# Patient Record
Sex: Male | Born: 1950 | Race: White | Hispanic: No | Marital: Married | Smoking: Never smoker
Health system: Southern US, Community
[De-identification: ages and names within clinical notes are randomized; demographics above are authoritative.]

## PROBLEM LIST (undated history)

## (undated) DIAGNOSIS — Z973 Presence of spectacles and contact lenses: Secondary | ICD-10-CM

## (undated) DIAGNOSIS — I10 Essential (primary) hypertension: Secondary | ICD-10-CM

## (undated) DIAGNOSIS — E782 Mixed hyperlipidemia: Secondary | ICD-10-CM

## (undated) DIAGNOSIS — Z87442 Personal history of urinary calculi: Secondary | ICD-10-CM

## (undated) DIAGNOSIS — I209 Angina pectoris, unspecified: Secondary | ICD-10-CM

## (undated) DIAGNOSIS — S83206A Unspecified tear of unspecified meniscus, current injury, right knee, initial encounter: Secondary | ICD-10-CM

## (undated) DIAGNOSIS — K760 Fatty (change of) liver, not elsewhere classified: Secondary | ICD-10-CM

## (undated) DIAGNOSIS — R011 Cardiac murmur, unspecified: Secondary | ICD-10-CM

## (undated) DIAGNOSIS — E785 Hyperlipidemia, unspecified: Secondary | ICD-10-CM

## (undated) DIAGNOSIS — M1990 Unspecified osteoarthritis, unspecified site: Secondary | ICD-10-CM

## (undated) DIAGNOSIS — R931 Abnormal findings on diagnostic imaging of heart and coronary circulation: Secondary | ICD-10-CM

## (undated) DIAGNOSIS — G4733 Obstructive sleep apnea (adult) (pediatric): Secondary | ICD-10-CM

## (undated) DIAGNOSIS — S83249A Other tear of medial meniscus, current injury, unspecified knee, initial encounter: Secondary | ICD-10-CM

## (undated) DIAGNOSIS — Z131 Encounter for screening for diabetes mellitus: Secondary | ICD-10-CM

## (undated) DIAGNOSIS — I2510 Atherosclerotic heart disease of native coronary artery without angina pectoris: Secondary | ICD-10-CM

## (undated) DIAGNOSIS — J45909 Unspecified asthma, uncomplicated: Secondary | ICD-10-CM

## (undated) DIAGNOSIS — T8859XA Other complications of anesthesia, initial encounter: Secondary | ICD-10-CM

## (undated) DIAGNOSIS — I300 Acute nonspecific idiopathic pericarditis: Secondary | ICD-10-CM

## (undated) DIAGNOSIS — Z860101 Personal history of adenomatous and serrated colon polyps: Secondary | ICD-10-CM

## (undated) DIAGNOSIS — J22 Unspecified acute lower respiratory infection: Secondary | ICD-10-CM

## (undated) DIAGNOSIS — K645 Perianal venous thrombosis: Secondary | ICD-10-CM

## (undated) DIAGNOSIS — Z86010 Personal history of colonic polyps: Secondary | ICD-10-CM

## (undated) DIAGNOSIS — T4145XA Adverse effect of unspecified anesthetic, initial encounter: Secondary | ICD-10-CM

## (undated) DIAGNOSIS — Z9989 Dependence on other enabling machines and devices: Secondary | ICD-10-CM

## (undated) HISTORY — DX: Abnormal findings on diagnostic imaging of heart and coronary circulation: R93.1

## (undated) HISTORY — DX: Unspecified acute lower respiratory infection: J22

## (undated) HISTORY — DX: Acute nonspecific idiopathic pericarditis: I30.0

## (undated) HISTORY — DX: Angina pectoris, unspecified: I20.9

## (undated) HISTORY — DX: Mixed hyperlipidemia: E78.2

## (undated) HISTORY — PX: APPENDECTOMY: SHX54

## (undated) HISTORY — DX: Essential (primary) hypertension: I10

## (undated) HISTORY — DX: Obstructive sleep apnea (adult) (pediatric): G47.33

## (undated) HISTORY — DX: Other tear of medial meniscus, current injury, unspecified knee, initial encounter: S83.249A

## (undated) HISTORY — PX: TOTAL KNEE ARTHROPLASTY: SHX125

## (undated) HISTORY — DX: Perianal venous thrombosis: K64.5

## (undated) HISTORY — DX: Encounter for screening for diabetes mellitus: Z13.1

---

## 1993-08-26 HISTORY — PX: NASAL SEPTUM SURGERY: SHX37

## 1993-08-26 HISTORY — PX: OTHER SURGICAL HISTORY: SHX169

## 2004-08-10 ENCOUNTER — Ambulatory Visit: Payer: Self-pay | Admitting: Family Medicine

## 2005-01-22 ENCOUNTER — Ambulatory Visit: Payer: Self-pay | Admitting: Family Medicine

## 2005-05-08 ENCOUNTER — Ambulatory Visit: Payer: Self-pay | Admitting: Family Medicine

## 2005-06-04 ENCOUNTER — Ambulatory Visit: Payer: Self-pay | Admitting: Family Medicine

## 2005-07-30 ENCOUNTER — Ambulatory Visit: Payer: Self-pay | Admitting: Family Medicine

## 2005-08-07 ENCOUNTER — Ambulatory Visit: Payer: Self-pay | Admitting: Family Medicine

## 2005-08-28 ENCOUNTER — Ambulatory Visit: Payer: Self-pay | Admitting: Family Medicine

## 2009-08-26 HISTORY — PX: COLONOSCOPY W/ POLYPECTOMY: SHX1380

## 2014-12-06 ENCOUNTER — Ambulatory Visit: Payer: Self-pay | Admitting: Orthopedic Surgery

## 2014-12-06 NOTE — Progress Notes (Signed)
Preoperative surgical orders have been place into the Epic hospital system for Eugene Taylor on 12/06/2014, 5:39 PM  by Patrica DuelPERKINS, ALEXZANDREW for surgery on 12-15-2014.  Preop Knee Scope orders including IV Tylenol and IV Decadron as long as there are no contraindications to the above medications. Avel Peacerew Perkins, PA-C

## 2014-12-12 ENCOUNTER — Encounter (HOSPITAL_BASED_OUTPATIENT_CLINIC_OR_DEPARTMENT_OTHER): Payer: Self-pay | Admitting: *Deleted

## 2014-12-12 NOTE — Progress Notes (Signed)
NPO AFTER MN. ARRIVE AT 1030. NEEDS ISTAT AND EKG.  

## 2014-12-15 ENCOUNTER — Ambulatory Visit (HOSPITAL_BASED_OUTPATIENT_CLINIC_OR_DEPARTMENT_OTHER)
Admission: RE | Admit: 2014-12-15 | Discharge: 2014-12-15 | Disposition: A | Discharge status: Home / Self Care | Payer: BC Managed Care – PPO | Source: Ambulatory Visit | Attending: Orthopedic Surgery | Admitting: Orthopedic Surgery

## 2014-12-15 ENCOUNTER — Ambulatory Visit (HOSPITAL_BASED_OUTPATIENT_CLINIC_OR_DEPARTMENT_OTHER): Payer: BC Managed Care – PPO | Admitting: Anesthesiology

## 2014-12-15 ENCOUNTER — Encounter (HOSPITAL_BASED_OUTPATIENT_CLINIC_OR_DEPARTMENT_OTHER): Payer: Self-pay | Admitting: Anesthesiology

## 2014-12-15 ENCOUNTER — Other Ambulatory Visit: Payer: Self-pay

## 2014-12-15 ENCOUNTER — Encounter (HOSPITAL_BASED_OUTPATIENT_CLINIC_OR_DEPARTMENT_OTHER)
Admission: RE | Disposition: A | Discharge status: Home / Self Care | Payer: Self-pay | Source: Ambulatory Visit | Attending: Orthopedic Surgery

## 2014-12-15 DIAGNOSIS — Z87442 Personal history of urinary calculi: Secondary | ICD-10-CM | POA: Insufficient documentation

## 2014-12-15 DIAGNOSIS — E785 Hyperlipidemia, unspecified: Secondary | ICD-10-CM | POA: Insufficient documentation

## 2014-12-15 DIAGNOSIS — M2241 Chondromalacia patellae, right knee: Secondary | ICD-10-CM | POA: Diagnosis not present

## 2014-12-15 DIAGNOSIS — M23200 Derangement of unspecified lateral meniscus due to old tear or injury, right knee: Secondary | ICD-10-CM | POA: Insufficient documentation

## 2014-12-15 DIAGNOSIS — M23221 Derangement of posterior horn of medial meniscus due to old tear or injury, right knee: Secondary | ICD-10-CM | POA: Diagnosis not present

## 2014-12-15 DIAGNOSIS — S83249A Other tear of medial meniscus, current injury, unspecified knee, initial encounter: Secondary | ICD-10-CM | POA: Diagnosis present

## 2014-12-15 DIAGNOSIS — M19012 Primary osteoarthritis, left shoulder: Secondary | ICD-10-CM | POA: Diagnosis not present

## 2014-12-15 DIAGNOSIS — I10 Essential (primary) hypertension: Secondary | ICD-10-CM | POA: Diagnosis not present

## 2014-12-15 DIAGNOSIS — J45909 Unspecified asthma, uncomplicated: Secondary | ICD-10-CM | POA: Diagnosis not present

## 2014-12-15 DIAGNOSIS — Z9981 Dependence on supplemental oxygen: Secondary | ICD-10-CM | POA: Diagnosis not present

## 2014-12-15 DIAGNOSIS — G4733 Obstructive sleep apnea (adult) (pediatric): Secondary | ICD-10-CM | POA: Diagnosis not present

## 2014-12-15 HISTORY — DX: Presence of spectacles and contact lenses: Z97.3

## 2014-12-15 HISTORY — DX: Hyperlipidemia, unspecified: E78.5

## 2014-12-15 HISTORY — DX: Unspecified tear of unspecified meniscus, current injury, right knee, initial encounter: S83.206A

## 2014-12-15 HISTORY — DX: Unspecified osteoarthritis, unspecified site: M19.90

## 2014-12-15 HISTORY — PX: KNEE ARTHROSCOPY WITH MEDIAL MENISECTOMY: SHX5651

## 2014-12-15 HISTORY — PX: CHONDROPLASTY: SHX5177

## 2014-12-15 HISTORY — DX: Personal history of urinary calculi: Z87.442

## 2014-12-15 HISTORY — DX: Essential (primary) hypertension: I10

## 2014-12-15 HISTORY — DX: Other tear of medial meniscus, current injury, unspecified knee, initial encounter: S83.249A

## 2014-12-15 HISTORY — DX: Unspecified asthma, uncomplicated: J45.909

## 2014-12-15 HISTORY — DX: Dependence on other enabling machines and devices: Z99.89

## 2014-12-15 HISTORY — PX: KNEE ARTHROSCOPY WITH LATERAL MENISECTOMY: SHX6193

## 2014-12-15 HISTORY — DX: Personal history of adenomatous and serrated colon polyps: Z86.0101

## 2014-12-15 HISTORY — DX: Obstructive sleep apnea (adult) (pediatric): G47.33

## 2014-12-15 HISTORY — DX: Personal history of colonic polyps: Z86.010

## 2014-12-15 LAB — POCT I-STAT 4, (NA,K, GLUC, HGB,HCT)
Glucose, Bld: 88 mg/dL (ref 70–99)
HCT: 50 % (ref 39.0–52.0)
Hemoglobin: 17 g/dL (ref 13.0–17.0)
Potassium: 4.5 mmol/L (ref 3.5–5.1)
Sodium: 141 mmol/L (ref 135–145)

## 2014-12-15 SURGERY — ARTHROSCOPY, KNEE, WITH MEDIAL MENISCECTOMY
Anesthesia: General | Site: Knee | Laterality: Right

## 2014-12-15 MED ORDER — CEFAZOLIN SODIUM-DEXTROSE 2-3 GM-% IV SOLR
2.0000 g | INTRAVENOUS | Status: AC
  Administered 2014-12-15: 2 g via INTRAVENOUS
  Filled 2014-12-15: qty 50

## 2014-12-15 MED ORDER — LIDOCAINE HCL (CARDIAC) 20 MG/ML IV SOLN
INTRAVENOUS | Status: DC | PRN
  Administered 2014-12-15: 80 mg via INTRAVENOUS

## 2014-12-15 MED ORDER — KETOROLAC TROMETHAMINE 30 MG/ML IJ SOLN
INTRAMUSCULAR | Status: DC | PRN
  Administered 2014-12-15: 30 mg via INTRAVENOUS

## 2014-12-15 MED ORDER — LACTATED RINGERS IV SOLN
INTRAVENOUS | Status: DC
  Filled 2014-12-15: qty 1000

## 2014-12-15 MED ORDER — CEFAZOLIN SODIUM-DEXTROSE 2-3 GM-% IV SOLR
INTRAVENOUS | Status: AC
  Filled 2014-12-15: qty 50

## 2014-12-15 MED ORDER — DEXAMETHASONE SODIUM PHOSPHATE 10 MG/ML IJ SOLN
10.0000 mg | Freq: Once | INTRAMUSCULAR | Status: AC
Start: 2014-12-15 — End: 2014-12-15
  Administered 2014-12-15: 10 mg via INTRAVENOUS
  Filled 2014-12-15: qty 1

## 2014-12-15 MED ORDER — CHLORHEXIDINE GLUCONATE 4 % EX LIQD
60.0000 mL | Freq: Once | CUTANEOUS | Status: DC
  Filled 2014-12-15: qty 60

## 2014-12-15 MED ORDER — MIDAZOLAM HCL 2 MG/2ML IJ SOLN
INTRAMUSCULAR | Status: AC
  Filled 2014-12-15: qty 2

## 2014-12-15 MED ORDER — PROPOFOL INFUSION 10 MG/ML OPTIME
INTRAVENOUS | Status: DC | PRN
  Administered 2014-12-15: 15 mL via INTRAVENOUS
  Administered 2014-12-15: 5 mL via INTRAVENOUS

## 2014-12-15 MED ORDER — SODIUM CHLORIDE 0.9 % IR SOLN
Status: DC | PRN
  Administered 2014-12-15: 6000 mL

## 2014-12-15 MED ORDER — FENTANYL CITRATE (PF) 100 MCG/2ML IJ SOLN
INTRAMUSCULAR | Status: AC
  Filled 2014-12-15: qty 4

## 2014-12-15 MED ORDER — MIDAZOLAM HCL 5 MG/5ML IJ SOLN
INTRAMUSCULAR | Status: DC | PRN
  Administered 2014-12-15: 2 mg via INTRAVENOUS

## 2014-12-15 MED ORDER — LABETALOL HCL 5 MG/ML IV SOLN
5.0000 mg | Freq: Once | INTRAVENOUS | Status: AC
  Administered 2014-12-15: 5 mg via INTRAVENOUS
  Filled 2014-12-15: qty 4

## 2014-12-15 MED ORDER — SODIUM CHLORIDE 0.9 % IV SOLN
INTRAVENOUS | Status: DC
  Filled 2014-12-15: qty 1000

## 2014-12-15 MED ORDER — HYDRALAZINE HCL 20 MG/ML IJ SOLN
5.0000 mg | Freq: Once | INTRAMUSCULAR | Status: AC
  Administered 2014-12-15: 5 mg via INTRAVENOUS
  Filled 2014-12-15: qty 0.25

## 2014-12-15 MED ORDER — METHOCARBAMOL 1000 MG/10ML IJ SOLN
500.0000 mg | Freq: Three times a day (TID) | INTRAVENOUS | Status: DC
  Administered 2014-12-15: 500 mg via INTRAVENOUS
  Filled 2014-12-15: qty 5

## 2014-12-15 MED ORDER — FENTANYL CITRATE (PF) 100 MCG/2ML IJ SOLN
INTRAMUSCULAR | Status: DC | PRN
  Administered 2014-12-15: 100 ug via INTRAVENOUS

## 2014-12-15 MED ORDER — FENTANYL CITRATE (PF) 100 MCG/2ML IJ SOLN
25.0000 ug | INTRAMUSCULAR | Status: DC | PRN
  Filled 2014-12-15: qty 1

## 2014-12-15 MED ORDER — HYDRALAZINE HCL 20 MG/ML IJ SOLN
INTRAMUSCULAR | Status: AC
  Filled 2014-12-15: qty 1

## 2014-12-15 MED ORDER — HYDROCODONE-ACETAMINOPHEN 5-325 MG PO TABS
1.0000 | ORAL_TABLET | Freq: Four times a day (QID) | ORAL | Status: DC | PRN
  Administered 2014-12-15: 1 via ORAL
  Filled 2014-12-15: qty 1

## 2014-12-15 MED ORDER — LACTATED RINGERS IV SOLN
INTRAVENOUS | Status: DC
  Administered 2014-12-15 (×3): via INTRAVENOUS
  Filled 2014-12-15: qty 1000

## 2014-12-15 MED ORDER — BUPIVACAINE-EPINEPHRINE 0.25% -1:200000 IJ SOLN
INTRAMUSCULAR | Status: DC | PRN
  Administered 2014-12-15: 20 mL

## 2014-12-15 MED ORDER — METHOCARBAMOL 500 MG PO TABS
ORAL_TABLET | ORAL | Status: AC
  Filled 2014-12-15: qty 1

## 2014-12-15 MED ORDER — METHOCARBAMOL 500 MG PO TABS: 500.0000 mg | ORAL_TABLET | Freq: Four times a day (QID) | ORAL | Status: DC

## 2014-12-15 MED ORDER — ONDANSETRON HCL 4 MG/2ML IJ SOLN
INTRAMUSCULAR | Status: DC | PRN
  Administered 2014-12-15: 4 mg via INTRAVENOUS

## 2014-12-15 MED ORDER — HYDROCODONE-ACETAMINOPHEN 5-325 MG PO TABS: 1.0000 | ORAL_TABLET | Freq: Four times a day (QID) | ORAL | Status: DC | PRN

## 2014-12-15 MED ORDER — LABETALOL HCL 5 MG/ML IV SOLN
INTRAVENOUS | Status: AC
  Filled 2014-12-15: qty 4

## 2014-12-15 MED ORDER — HYDROCODONE-ACETAMINOPHEN 5-325 MG PO TABS
ORAL_TABLET | ORAL | Status: AC
  Filled 2014-12-15: qty 1

## 2014-12-15 MED ORDER — ACETAMINOPHEN 10 MG/ML IV SOLN
1000.0000 mg | Freq: Once | INTRAVENOUS | Status: AC
  Administered 2014-12-15: 1000 mg via INTRAVENOUS
  Filled 2014-12-15: qty 100

## 2014-12-15 SURGICAL SUPPLY — 43 items
BANDAGE ELASTIC 6 VELCRO ST LF (GAUZE/BANDAGES/DRESSINGS) ×2 IMPLANT
BLADE 4.2CUDA (BLADE) ×2 IMPLANT
BLADE CUDA SHAVER 3.5 (BLADE) IMPLANT
BLADE CUTTER GATOR 3.5 (BLADE) IMPLANT
CANISTER SUCT LVC 12 LTR MEDI- (MISCELLANEOUS) ×2 IMPLANT
CANISTER SUCTION 2500CC (MISCELLANEOUS) IMPLANT
CLOTH BEACON ORANGE TIMEOUT ST (SAFETY) ×2 IMPLANT
CUFF TOURN SGL QUICK 34 (TOURNIQUET CUFF) ×2
CUFF TRNQT CYL 34X4X40X1 (TOURNIQUET CUFF) IMPLANT
DRAPE ARTHROSCOPY W/POUCH 114 (DRAPES) ×2 IMPLANT
DRSG EMULSION OIL 3X3 NADH (GAUZE/BANDAGES/DRESSINGS) ×2 IMPLANT
DRSG PAD ABDOMINAL 8X10 ST (GAUZE/BANDAGES/DRESSINGS) ×1 IMPLANT
DURAPREP 26ML APPLICATOR (WOUND CARE) ×2 IMPLANT
ELECT MENISCUS 165MM 90D (ELECTRODE) IMPLANT
ELECT REM PT RETURN 9FT ADLT (ELECTROSURGICAL)
ELECTRODE REM PT RTRN 9FT ADLT (ELECTROSURGICAL) IMPLANT
GLOVE BIO SURGEON STRL SZ8 (GLOVE) ×2 IMPLANT
GLOVE BIOGEL M 6.5 STRL (GLOVE) ×1 IMPLANT
GLOVE BIOGEL PI IND STRL 6.5 (GLOVE) IMPLANT
GLOVE BIOGEL PI IND STRL 7.5 (GLOVE) IMPLANT
GLOVE BIOGEL PI INDICATOR 6.5 (GLOVE) ×1
GLOVE BIOGEL PI INDICATOR 7.5 (GLOVE) ×1
GLOVE INDICATOR 8.0 STRL GRN (GLOVE) ×2 IMPLANT
GOWN STRL REUS W/ TWL LRG LVL3 (GOWN DISPOSABLE) ×2 IMPLANT
GOWN STRL REUS W/TWL LRG LVL3 (GOWN DISPOSABLE) ×5 IMPLANT
GOWN STRL REUS W/TWL XL LVL3 (GOWN DISPOSABLE) ×1 IMPLANT
IV NS IRRIG 3000ML ARTHROMATIC (IV SOLUTION) ×2 IMPLANT
KNEE WRAP E Z 3 GEL PACK (MISCELLANEOUS) ×2 IMPLANT
PACK ARTHROSCOPY DSU (CUSTOM PROCEDURE TRAY) ×2 IMPLANT
PACK BASIN DAY SURGERY FS (CUSTOM PROCEDURE TRAY) ×2 IMPLANT
PADDING CAST ABS 4INX4YD NS (CAST SUPPLIES) ×1
PADDING CAST ABS COTTON 4X4 ST (CAST SUPPLIES) ×1 IMPLANT
PADDING CAST COTTON 6X4 STRL (CAST SUPPLIES) ×2 IMPLANT
PENCIL BUTTON HOLSTER BLD 10FT (ELECTRODE) IMPLANT
SET ARTHROSCOPY TUBING (MISCELLANEOUS) ×2
SET ARTHROSCOPY TUBING LN (MISCELLANEOUS) ×1 IMPLANT
SPONGE GAUZE 4X4 12PLY (GAUZE/BANDAGES/DRESSINGS) ×2 IMPLANT
SPONGE GAUZE 4X4 12PLY STER LF (GAUZE/BANDAGES/DRESSINGS) ×1 IMPLANT
SUT ETHILON 4 0 PS 2 18 (SUTURE) ×2 IMPLANT
TOWEL OR 17X24 6PK STRL BLUE (TOWEL DISPOSABLE) ×2 IMPLANT
WAND 30 DEG SABER W/CORD (SURGICAL WAND) IMPLANT
WAND 90 DEG TURBOVAC W/CORD (SURGICAL WAND) ×1 IMPLANT
WATER STERILE IRR 500ML POUR (IV SOLUTION) ×2 IMPLANT

## 2014-12-15 NOTE — Interval H&P Note (Signed)
History and Physical Interval Note:  12/15/2014 1:20 PM  Eugene Taylor  has presented today for surgery, with the diagnosis of RIGHT KNEE MEDIAL MENISCUS TEAR  The various methods of treatment have been discussed with the patient and family. After consideration of risks, benefits and other options for treatment, the patient has consented to  Procedure(s): RIGHT KNEE ARTHROSCOPY WITH DEBRIDEMENT (Right) as a surgical intervention .  The patient's history has been reviewed, patient examined, no change in status, stable for surgery.  I have reviewed the patient's chart and labs.  Questions were answered to the patient's satisfaction.     Loanne DrillingALUISIO,Denym Rahimi V

## 2014-12-15 NOTE — H&P (Signed)
  CC- Eugene MeierRicky D Taylor is a 64 y.o. male who presents with right knee pain.  HPI- . Knee Pain: Patient presents with knee pain involving the  right knee. Onset of the symptoms was several months ago. Inciting event: none known. Current symptoms include giving out, pain located medially, stiffness and swelling. Pain is aggravated by going up and down stairs, lateral movements, pivoting and rising after sitting.  Patient has had no prior knee problems. Evaluation to date: MRI: abnormal medial meniscal tear. Treatment to date: rest.  Past Medical History  Diagnosis Date  . Right knee meniscal tear   . Hypertension   . Asthma   . Hyperlipidemia   . OSA on CPAP   . History of kidney stones   . Arthritis     LEFT SHOULDER  . Wears glasses   . History of adenomatous polyp of colon     Past Surgical History  Procedure Laterality Date  . Appendectomy  age 64  . Cysto/  ureteroscopic stone extractions  1995  . Nasal septum surgery  1995  . Colonoscopy w/ polypectomy  2011    Prior to Admission medications   Medication Sig Start Date End Date Taking? Authorizing Provider  Cholecalciferol (VITAMIN D3) 1000 UNITS CAPS Take 1 capsule by mouth daily.   Yes Historical Provider, MD  lisinopril (PRINIVIL,ZESTRIL) 10 MG tablet Take 10 mg by mouth daily.   Yes Historical Provider, MD  montelukast (SINGULAIR) 10 MG tablet Take 10 mg by mouth at bedtime.   Yes Historical Provider, MD  Multiple Vitamin (MULTIVITAMIN) tablet Take 1 tablet by mouth daily.   Yes Historical Provider, MD  Probiotic Product (PROBIOTIC DAILY PO) Take 1 capsule by mouth daily.   Yes Historical Provider, MD   KNEE EXAM antalgic gait, soft tissue tenderness over medial joint line, effusion, negative pivot-shift, collateral ligaments intact  Physical Examination: General appearance - alert, well appearing, and in no distress Mental status - alert, oriented to person, place, and time Chest - clear to auscultation, no wheezes,  rales or rhonchi, symmetric air entry Heart - normal rate, regular rhythm, normal S1, S2, no murmurs, rubs, clicks or gallops Abdomen - soft, nontender, nondistended, no masses or organomegaly Neurological - alert, oriented, normal speech, no focal findings or movement disorder noted   Asessment/Plan--- Right knee medial meniscal tear- - Plan right knee arthroscopy with meniscal debridement. Procedure risks and potential comps discussed with patient who elects to proceed. Goals are decreased pain and increased function with a high likelihood of achieving both

## 2014-12-15 NOTE — Transfer of Care (Signed)
Immediate Anesthesia Transfer of Care Note  Patient: Donnal DebarRicky D Youngman  Procedure(s) Performed: Procedure(s): KNEE ARTHROSCOPY WITH MEDIAL MENISECTOMY (Right) KNEE ARTHROSCOPY WITH LATERAL MENISECTOMY (Right) CHONDROPLASTY (Right)  Patient Location: PACU  Anesthesia Type:General  Level of Consciousness: awake, alert , oriented and patient cooperative  Airway & Oxygen Therapy: Patient Spontanous Breathing and Patient connected to nasal cannula oxygen  Post-op Assessment: Report given to RN and Post -op Vital signs reviewed and stable  Post vital signs: Reviewed and stable  Last Vitals:  Filed Vitals:   12/15/14 1051  BP: 156/95  Pulse: 71  Temp: 36.5 C  Resp: 20    Complications: No apparent anesthesia complications

## 2014-12-15 NOTE — Anesthesia Procedure Notes (Signed)
Procedure Name: LMA Insertion Date/Time: 12/15/2014 1:28 PM Performed by: Tyrone NineSAUVE, [name] Pre-anesthesia Checklist: Patient identified, Emergency Drugs available, Suction available, Patient being monitored and Timeout performed Patient Re-evaluated:Patient Re-evaluated prior to inductionOxygen Delivery Method: Circle system utilized Preoxygenation: Pre-oxygenation with 100% oxygen Intubation Type: IV induction Ventilation: Mask ventilation without difficulty LMA: LMA inserted LMA Size: 5.0 Number of attempts: 1 Placement Confirmation: breath sounds checked- equal and bilateral and positive ETCO2 Tube secured with: Tape Dental Injury: Teeth and Oropharynx as per pre-operative assessment

## 2014-12-15 NOTE — Anesthesia Postprocedure Evaluation (Signed)
  Anesthesia Post-op Note  Patient: Eugene Taylor  Procedure(s) Performed: Procedure(s) (LRB): KNEE ARTHROSCOPY WITH MEDIAL MENISECTOMY (Right) KNEE ARTHROSCOPY WITH LATERAL MENISECTOMY (Right) CHONDROPLASTY (Right)  Patient Location: PACU  Anesthesia Type: General  Level of Consciousness: awake and alert   Airway and Oxygen Therapy: Patient Spontanous Breathing  Post-op Pain: mild  Post-op Assessment: Post-op Vital signs reviewed, Patient's Cardiovascular Status Stable, Respiratory Function Stable, Patent Airway and No signs of Nausea or vomiting  Last Vitals:  Filed Vitals:   12/15/14 1430  BP: 178/105  Pulse: 71  Temp:   Resp:     Post-op Vital Signs: stable   Complications: No apparent anesthesia complications

## 2014-12-15 NOTE — Op Note (Signed)
Preoperative diagnosis-  Right knee medial meniscal tear  Postoperative diagnosis Right- knee medial and lateral meniscal tear plus  chondral defect  Procedure- Right knee arthroscopy with medial and lateral  meniscal debridement and chondroplasty   Surgeon- Gus RankinFrank V. Orean Giarratano, MD  Anesthesia-General  EBL-  Minimal  Complications- None  Condition- PACU - hemodynamically stable.  Brief clinical note- -Manual MeierRicky D Taylor is a 64 y.o.  male with a several month history of right knee pain and mechanical symptoms. Exam and history suggested medial meniscal tear confirmed by MRI. The patient presents now for arthroscopy and debridement  Procedure in detail -       After successful administration of General anesthetic, a tourmiquet is placed high on the Right  thigh and the Right lower extremity is prepped and draped in the usual sterile fashion. Time out is performed by the surgical team. Standard superomedial and inferolateral portal sites are marked and incisions made with an 11 blade. The inflow cannula is passed through the superomedial portal and camera through the inferolateral portal and inflow is initiated. Arthroscopic visualization proceeds.      The undersurface of the patella and trochlea are visualized and there is minimal chondromalacia. The medial and lateral gutters are visualized and there are  no loose bodies. Flexion and valgus force is applied to the knee and the medial compartment is entered. A spinal needle is passed into the joint through the site marked for the inferomedial portal. A small incision is made and the dilator passed into the joint. The findings for the medial compartment are unstable tear body and posterior horn medial meniscus with 1 x 2 cm chondral defect medial femoral condyle and 1 x 1 cm defect medial tibial plateau. . The tear is debrided to a stable base with baskets and a shaver and sealed off with the Arthrocare. The shaver is used to debride the unstable  cartilage to a stable bony base with stable edges. The bone is abraded with the shaver.It is probed and found to be stable.    The intercondylar notch is visualized and the ACL appears normal. The lateral compartment is entered and the findings are tear of body of lateral meniscus . The tear is debrided to a stable base with baskets and a shaver and sealed off with the Arthrocare. It is probed and found to be stable.     The joint is again inspected and there are no other tears, defects or loose bodies identified. The arthroscopic equipment is then removed from the inferior portals which are closed with interrupted 4-0 nylon. 20 ml of .25% Marcaine with epinephrine are injected through the inflow cannula and the cannula is then removed and the portal closed with nylon. The incisions are cleaned and dried and a bulky sterile dressing is applied. The patient is then awakened and transported to recovery in stable condition.   12/15/2014, 2:01 PM

## 2014-12-15 NOTE — Discharge Instructions (Signed)
Dr. Ollen Gross Total Joint Specialist [facility] [address], [address] [place], [place] [address] [phone]   Arthroscopic Procedure, Knee An arthroscopic procedure can find what is wrong with your knee. PROCEDURE Arthroscopy is a surgical technique that allows your orthopedic surgeon to diagnose and treat your knee injury with accuracy. They will look into your knee through a small instrument. This is almost like a small (pencil sized) telescope. Because arthroscopy affects your knee less than open knee surgery, you can anticipate a more rapid recovery. Taking an active role by following your caregiver's instructions will help with rapid and complete recovery. Use crutches, rest, elevation, ice, and knee exercises as instructed. The length of recovery depends on various factors including type of injury, age, physical condition, medical conditions, and your rehabilitation. Your knee is the joint between the large bones (femur and tibia) in your leg. Cartilage covers these bone ends which are smooth and slippery and allow your knee to bend and move smoothly. Two menisci, thick, semi-lunar shaped pads of cartilage which form a rim inside the joint, help absorb shock and stabilize your knee. Ligaments bind the bones together and support your knee joint. Muscles move the joint, help support your knee, and take stress off the joint itself. Because of this all programs and physical therapy to rehabilitate an injured or repaired knee require rebuilding and strengthening your muscles. AFTER THE PROCEDURE  After the procedure, you will be moved to a recovery area until most of the effects of the medication have worn off. Your caregiver will discuss the test results with you.   Only take over-the-counter or prescription medicines for pain, discomfort, or fever as directed by your caregiver.  SEEK MEDICAL CARE IF:   You have increased bleeding from your wounds.   You see  redness, swelling, or have increasing pain in your wounds.   You have pus coming from your wound.   You have an oral temperature above 102 F (38.9 C).   You notice a bad smell coming from the wound or dressing.   You have severe pain with any motion of your knee.  SEEK IMMEDIATE MEDICAL CARE IF:   You develop a rash.   You have difficulty breathing.   You have any allergic problems.  FURTHER INSTRUCTIONS:   ICE to the affected knee every three hours for 30 minutes at a time and then as needed for pain and swelling.  Continue to use ice on the knee for pain and swelling from surgery. You may notice swelling that will progress down to the foot and ankle.  This is normal after surgery.  Elevate the leg when you are not up walking on it.    DIET You may resume your previous home diet once your are discharged from the hospital.  DRESSING / WOUND CARE / SHOWERING  You may start showering two days after being discharged home but do not submerge the incisions under water.  Change dressing 48 hours after the procedure and then cover the small incisions with band aids until your follow up visit.   ACTIVITY Walk with your crutches as instructed. Use walker as long as suggested by your caregivers. Avoid periods of inactivity such as sitting longer than an hour when not asleep. This helps prevent blood clots.  You may resume a sexual relationship in one month or when given the OK by your doctor.  You may return to work once you are cleared by your doctor.  Do not  drive a car for 6 weeks or until released by you surgeon.  Do not drive while taking narcotics.  WEIGHT BEARING Weight bearing as tolerated with assist device (walker, cane, etc) as directed, use it as long as suggested by your surgeon or therapist, typically at least 4-6 weeks.  POSTOPERATIVE CONSTIPATION PROTOCOL Constipation - defined medically as fewer than three stools per week and severe constipation as less than one  stool per week.  One of the most common issues patients have following surgery is constipation.  Even if you have a regular bowel pattern at home, your normal regimen is likely to be disrupted due to multiple reasons following surgery.  Combination of anesthesia, postoperative narcotics, change in appetite and fluid intake all can affect your bowels.  In order to avoid complications following surgery, here are some recommendations in order to help you during your recovery period.  Colace (docusate) - Pick up an over-the-counter form of Colace or another stool softener and take twice a day as long as you are requiring postoperative pain medications.  Take with a full glass of water daily.  If you experience loose stools or diarrhea, hold the colace until you stool forms back up.  If your symptoms do not get better within 1 week or if they get worse, check with your doctor.  Dulcolax (bisacodyl) - Pick up over-the-counter and take as directed by the product packaging as needed to assist with the movement of your bowels.  Take with a full glass of water.  Use this product as needed if not relieved by Colace only.   MiraLax (polyethylene glycol) - Pick up over-the-counter to have on hand.  MiraLax is a solution that will increase the amount of water in your bowels to assist with bowel movements.  Take as directed and can mix with a glass of water, juice, soda, coffee, or tea.  Take if you go more than two days without a movement. Do not use MiraLax more than once per day. Call your doctor if you are still constipated or irregular after using this medication for 7 days in a row.  If you continue to have problems with postoperative constipation, please contact the office for further assistance and recommendations.  If you experience "the worst abdominal pain ever" or develop nausea or vomiting, please contact the office immediatly for further recommendations for treatment.  ITCHING  If you experience itching  with your medications, try taking only a single pain pill, or even half a pain pill at a time.  You can also use Benadryl over the counter for itching or also to help with sleep.   TED HOSE STOCKINGS Wear the elastic stockings on both legs for three weeks following surgery during the day but you may remove then at night for sleeping.  MEDICATIONS See your medication summary on the After Visit Summary that the nursing staff will review with you prior to discharge.  You may have some home medications which will be placed on hold until you complete the course of blood thinner medication.  It is important for you to complete the blood thinner medication as prescribed by your surgeon.  Continue your approved medications as instructed at time of discharge. Do not drive while taking narcotics.   PRECAUTIONS If you experience chest pain or shortness of breath - call 911 immediately for transfer to the hospital emergency department.  If you develop a fever greater that 101 F, purulent drainage from wound, increased redness or drainage from wound,  foul odor from the wound/dressing, or calf pain - CONTACT YOUR SURGEON.                                                   FOLLOW-UP APPOINTMENTS Make sure you keep all of your appointments after your operation with your surgeon and caregivers. You should call the office at [phone]  and make an appointment for approximately one week after the date of your surgery or on the date instructed by your surgeon outlined in the "After Visit Summary".  RANGE OF MOTION AND STRENGTHENING EXERCISES  Rehabilitation of the knee is important following a knee injury or an operation. After just a few days of immobilization, the muscles of the thigh which control the knee become weakened and shrink (atrophy). Knee exercises are designed to build up the tone and strength of the thigh muscles and to improve knee motion. Often times heat used for twenty to thirty minutes before  working out will loosen up your tissues and help with improving the range of motion but do not use heat for the first two weeks following surgery. These exercises can be done on a training (exercise) mat, on the floor, on a table or on a bed. Use what ever works the best and is most comfortable for you Knee exercises include:  QUAD STRENGTHENING EXERCISES Strengthening Quadriceps Sets  Tighten muscles on top of thigh by pushing knees down into floor or table. Hold for 20 seconds. Repeat 10 times. Do 2 sessions per day.     Strengthening Terminal Knee Extension  With knee bent over bolster, straighten knee by tightening muscle on top of thigh. Be sure to keep bottom of knee on bolster. Hold for 20 seconds. Repeat 10 times. Do 2 sessions per day.   Straight Leg with Bent Knee  Lie on back with opposite leg bent. Keep involved knee slightly bent at knee and raise leg 4-6". Hold for 10 seconds. Repeat 20 times per set. Do 2 sets per session. Do 2 sessions per day.     Post Anesthesia Home Care Instructions  Activity: Get plenty of rest for the remainder of the day. A responsible adult should stay with you for 24 hours following the procedure.  For the next 24 hours, DO NOT: -Drive a car -Advertising copywriter -Drink alcoholic beverages -Take any medication unless instructed by your physician -Make any legal decisions or sign important papers.  Meals: Start with liquid foods such as gelatin or soup. Progress to regular foods as tolerated. Avoid greasy, spicy, heavy foods. If nausea and/or vomiting occur, drink only clear liquids until the nausea and/or vomiting subsides. Call your physician if vomiting continues.  Special Instructions/Symptoms: Your throat may feel dry or sore from the anesthesia or the breathing tube placed in your throat during surgery. If this causes discomfort, gargle with warm salt water. The discomfort should disappear within 24 hours.  If you had a  scopolamine patch placed behind your ear for the management of post- operative nausea and/or vomiting:  1. The medication in the patch is effective for 72 hours, after which it should be removed.  Wrap patch in a tissue and discard in the trash. Wash hands thoroughly with soap and water. 2. You may remove the patch earlier than 72 hours if you experience unpleasant side effects which may include dry  mouth, dizziness or visual disturbances. 3. Avoid touching the patch. Wash your hands with soap and water after contact with the patch.

## 2014-12-15 NOTE — Anesthesia Preprocedure Evaluation (Addendum)
Anesthesia Evaluation  Patient identified by MRN, date of birth, ID band Patient awake    Reviewed: Allergy & Precautions, H&P , NPO status , Patient's Chart, lab work & pertinent test results  Airway Mallampati: III  TM Distance: >3 FB Neck ROM: full    Dental no notable dental hx. (+) Teeth Intact, Dental Advisory Given   Pulmonary asthma , sleep apnea and Continuous Positive Airway Pressure Ventilation ,  breath sounds clear to auscultation  Pulmonary exam normal       Cardiovascular Exercise Tolerance: Good hypertension, Pt. on medications Rhythm:regular Rate:Normal     Neuro/Psych negative neurological ROS  negative psych ROS   GI/Hepatic negative GI ROS, Neg liver ROS,   Endo/Other  negative endocrine ROS  Renal/GU negative Renal ROS  negative genitourinary   Musculoskeletal  (+) Arthritis -, Osteoarthritis,    Abdominal   Peds  Hematology negative hematology ROS (+)   Anesthesia Other Findings   Reproductive/Obstetrics negative OB ROS                          Anesthesia Physical Anesthesia Plan  ASA: III  Anesthesia Plan: General   Post-op Pain Management:    Induction: Intravenous  Airway Management Planned: LMA  Additional Equipment:   Intra-op Plan:   Post-operative Plan:   Informed Consent: I have reviewed the patients History and Physical, chart, labs and discussed the procedure including the risks, benefits and alternatives for the proposed anesthesia with the patient or authorized representative who has indicated his/her understanding and acceptance.   Dental Advisory Given  Plan Discussed with: CRNA, Surgeon and Anesthesiologist  Anesthesia Plan Comments:        Anesthesia Quick Evaluation

## 2014-12-16 ENCOUNTER — Encounter (HOSPITAL_BASED_OUTPATIENT_CLINIC_OR_DEPARTMENT_OTHER): Payer: Self-pay | Admitting: Orthopedic Surgery

## 2015-06-21 ENCOUNTER — Ambulatory Visit (INDEPENDENT_AMBULATORY_CARE_PROVIDER_SITE_OTHER): Payer: BC Managed Care – PPO | Admitting: Allergy and Immunology

## 2015-06-21 ENCOUNTER — Encounter: Payer: Self-pay | Admitting: Allergy and Immunology

## 2015-06-21 VITALS — BP 132/82 | HR 72 | Temp 97.9°F | Resp 20 | Ht 68.98 in | Wt 213.2 lb

## 2015-06-21 DIAGNOSIS — L500 Allergic urticaria: Secondary | ICD-10-CM

## 2015-06-21 DIAGNOSIS — T7840XA Allergy, unspecified, initial encounter: Secondary | ICD-10-CM | POA: Diagnosis not present

## 2015-06-21 DIAGNOSIS — T7800XA Anaphylactic reaction due to unspecified food, initial encounter: Secondary | ICD-10-CM | POA: Diagnosis not present

## 2015-06-21 MED ORDER — LOSARTAN POTASSIUM 50 MG PO TABS: 50.0000 mg | ORAL_TABLET | Freq: Every day | ORAL | Status: DC

## 2015-06-21 NOTE — Patient Instructions (Addendum)
  1. Allergen avoidance measures   2. Epi-Pen, benadryl, MD / ER  3. Change lisinopril to losartan 50 one tablet one time per day. Check blood pressure  4. Blood test: cbc w/diff, cmp, alpha-gal panel   5. Get a flu vaccine  6. Further evaluation? Yes, if recurrent.

## 2015-06-21 NOTE — Progress Notes (Signed)
[name]    NEW PATIENT NOTE  Referring Provider: Olive Bass, MD Primary Provider: Dina Rich, MD    Subjective:   Patient ID: Eugene Taylor is a 64 y.o. male with a chief complaint of Urticaria and Allergic Reaction  and the following problems:  HPI Comments:  [name] presents this clinic in evaluation of a allergic reaction that occurred approximately 2 weeks ago at around 10:30 at nighttime. He presented with global urticaria and global itching for which she took Benadryl. He subsequently developed trouble breathing and ended up in the emergency room with an approximate 45 minutes after onset of his reaction. He tells me that he had an increased blood pressure when he got to the emergency room. He was treated unless the emergency room around 2:30 AM. There was no associated systemic or constitutional symptoms. His lesions never healed with scar or hyperpigmentation. There was no obvious trigger. However, he did have a barbecue sandwich earlier that night. He does not really have a atopic history. There is no associated allergic rhinitis or asthma or atopic dermatitis.   Past Medical History  Diagnosis Date  . Right knee meniscal tear   . Hypertension   . Asthma   . Hyperlipidemia   . OSA on CPAP   . History of kidney stones   . Arthritis     LEFT SHOULDER  . Wears glasses   . History of adenomatous polyp of colon     Past Surgical History  Procedure Laterality Date  . Appendectomy  age 17  . Cysto/  ureteroscopic stone extractions  1995  . Nasal septum surgery  1995  . Colonoscopy w/ polypectomy  2011  . Knee arthroscopy with medial menisectomy Right 12/15/2014    Procedure: KNEE ARTHROSCOPY WITH MEDIAL MENISECTOMY;  Surgeon: Ollen Gross, MD;  Location: [facility];  Service: Orthopedics;  Laterality: Right;  . Knee arthroscopy with lateral menisectomy Right 12/15/2014    Procedure: KNEE  ARTHROSCOPY WITH LATERAL MENISECTOMY;  Surgeon: Ollen Gross, MD;  Location: [facility];  Service: Orthopedics;  Laterality: Right;  . Chondroplasty Right 12/15/2014    Procedure: CHONDROPLASTY;  Surgeon: Ollen Gross, MD;  Location: [facility];  Service: Orthopedics;  Laterality: Right;    Medication Sig  . Cholecalciferol (VITAMIN D3) 1000 UNITS CAPS Take 1 capsule by mouth daily.  Marland Kitchen lisinopril (PRINIVIL,ZESTRIL) 10 MG tablet Take 10 mg by mouth daily.  . Multiple Vitamin (MULTIVITAMIN) tablet Take 1 tablet by mouth daily.  . Probiotic Product (PROBIOTIC DAILY PO) Take 1 capsule by mouth daily.  Marland Kitchen HYDROcodone-acetaminophen (NORCO) 5-325 MG per tablet Take 1-2 tablets by mouth every 6 (six) hours as needed for moderate pain. (Patient not taking: Reported on 06/21/2015)  . methocarbamol (ROBAXIN) 500 MG tablet Take 1 tablet (500 mg total) by mouth 4 (four) times daily. As needed for muscle spasm (Patient not taking: Reported on 06/21/2015)   No facility-administered medications prior to visit.    Meds ordered this encounter  Medications  . losartan (COZAAR) 50 MG tablet    Sig: Take 1 tablet (50 mg total) by mouth daily.    Dispense:  30 tablet    Refill:  1    Allergies  Allergen Reactions  . Hydrocodeine [Dihydrocodeine] Itching  . Penicillins Hives and Other (See Comments)    Unsure of reaction- happened as a young child.    Review of Systems  Constitutional: Negative  for fever, chills, weight loss and malaise/fatigue.  HENT: Negative for congestion, ear discharge, ear pain, hearing loss, nosebleeds, sore throat and tinnitus.        History of constant postnasal drip and throat clearing and clots stuck in his throat. This is a long-standing issue.  Eyes: Negative for blurred vision, pain, discharge and redness.  Respiratory: Negative for cough, hemoptysis, sputum production, shortness of breath, wheezing and stridor.   Cardiovascular: Negative  for chest pain, palpitations and leg swelling.  Gastrointestinal: Negative for heartburn, nausea, vomiting, abdominal pain and diarrhea.       History of reflux all the way up into his throat. This happens approximately twice a week. He takes Tums twice a week. He drinks coffee ENT daily and has chocolate daily.  Genitourinary: Negative for dysuria.  Musculoskeletal: Negative for myalgias and joint pain.  Skin: Negative for itching and rash.  Neurological: Negative for dizziness and headaches.    Family History  Problem Relation Age of Onset  . Stomach cancer Mother   . Pancreatic cancer Father     Social History   Social History  . Marital Status: Married    Spouse Name: N/A  . Number of Children: N/A  . Years of Education: N/A   Occupational History  . Not on file.   Social History Main Topics  . Smoking status: Never Smoker   . Smokeless tobacco: Never Used  . Alcohol Use: No  . Drug Use: No  . Sexual Activity: Not on file   Other Topics Concern  . Not on file   Social History Narrative    Environmental and Social history  Pearson lives in a house with a dry murmur, no animals located inside the household, carpeting in the bedroom, no clots, better pillow, no smokers located inside the household. He is a Optician, dispensingminister.   Objective:   Filed Vitals:   06/21/15 1349  BP: 132/82  Pulse: 72  Temp: 97.9 F (36.6 C)  Resp: 20   Height: 5' 8.98" (175.2 cm) Weight: 213 lb 3 oz (96.7 kg)  Physical Exam  Constitutional: He is well-developed, well-nourished, and in no distress. No distress.  HENT:  Head: Normocephalic. Head is without laceration, without right periorbital erythema and without left periorbital erythema. Hair is normal.  Right Ear: Tympanic membrane and external ear normal. No lacerations. No drainage or tenderness. No foreign bodies. Tympanic membrane is not injected, not scarred, not perforated, not erythematous, not retracted and not bulging. No middle  ear effusion.  Left Ear: Tympanic membrane and external ear normal. No lacerations. No drainage or tenderness. No foreign bodies. Tympanic membrane is not injected, not scarred, not perforated, not erythematous, not retracted and not bulging.  No middle ear effusion.  Nose: Nose normal. No mucosal edema, rhinorrhea, nose lacerations or septal deviation.  No foreign bodies.  Mouth/Throat: Uvula is midline and oropharynx is clear and moist. No oral lesions. No uvula swelling. No oropharyngeal exudate, posterior oropharyngeal edema, posterior oropharyngeal erythema or tonsillar abscesses.  Eyes: Conjunctivae are normal. Pupils are equal, round, and reactive to light. Right eye exhibits no discharge. Left eye exhibits no discharge. No scleral icterus.  Neck: No tracheal deviation present. No thyromegaly present.  Cardiovascular: Normal rate, regular rhythm and normal heart sounds.  Exam reveals no gallop and no friction rub.   No murmur heard. Pulmonary/Chest: Effort normal and breath sounds normal. No stridor. No respiratory distress. He has no wheezes. He has no rales. He exhibits no tenderness.  Abdominal: Soft. He exhibits no distension and no mass. There is no tenderness. There is no rebound and no guarding.  Musculoskeletal: He exhibits no edema or tenderness.  Lymphadenopathy:    He has no cervical adenopathy.  Neurological: He is alert. Gait normal.  Skin: No rash noted. He is not diaphoretic. No erythema. No pallor.  Psychiatric: Mood and affect normal.    Diagnostics:  Allergy skin tests were performed. He demonstrated hypersensitivity against beef and Lamb   Assessment and Plan:    1. Allergic reaction, initial encounter   2. Allergic urticaria   3. Allergy with anaphylaxis due to food, initial encounter      1. Allergen avoidance measures   2. Epi-Pen, benadryl, MD / ER  3. Change lisinopril to losartan 50 one tablet one time per day. Check blood pressure  4. Blood test:  cbc w/diff, cmp, alpha-gal panel   5. Get a flu vaccine  6. Further evaluation? Yes, if recurrent.  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 We'll confirm this with the blood tests mentioned above. Because he has had an allergic reaction we will remove his ACE inhibitor. Further evaluation and treatment will be based upon his response and results of his diagnostic testing.    Laurette Schimke, MD [facility]

## 2015-06-28 LAB — COMPREHENSIVE METABOLIC PANEL
ALT: 37 IU/L (ref 0–44)
AST: 29 IU/L (ref 0–40)
Albumin/Globulin Ratio: 2 (ref 1.1–2.5)
Albumin: 4.5 g/dL (ref 3.6–4.8)
Alkaline Phosphatase: 63 IU/L (ref 39–117)
BUN/Creatinine Ratio: 17 (ref 10–22)
BUN: 17 mg/dL (ref 8–27)
Bilirubin Total: 0.6 mg/dL (ref 0.0–1.2)
CO2: 24 mmol/L (ref 18–29)
Calcium: 9.3 mg/dL (ref 8.6–10.2)
Chloride: 101 mmol/L (ref 97–106)
Creatinine, Ser: 1.01 mg/dL (ref 0.76–1.27)
GFR calc Af Amer: 92 mL/min/{1.73_m2} (ref >59)
GFR calc non Af Amer: 79 mL/min/{1.73_m2} (ref >59)
Globulin, Total: 2.2 g/dL (ref 1.5–4.5)
Glucose: 122 mg/dL — ABNORMAL HIGH (ref 65–99)
Potassium: 4.1 mmol/L (ref 3.5–5.2)
Sodium: 137 mmol/L (ref 136–144)
Total Protein: 6.7 g/dL (ref 6.0–8.5)

## 2015-06-28 LAB — CBC WITH DIFFERENTIAL/PLATELET
Basophils Absolute: 0.1 10*3/uL (ref 0.0–0.2)
Basos: 0 %
EOS (ABSOLUTE): 0.1 10*3/uL (ref 0.0–0.4)
Eos: 2 %
Hematocrit: 47.2 % (ref 37.5–51.0)
Hemoglobin: 16.2 g/dL (ref 12.6–17.7)
Lymphocytes Absolute: 2.3 10*3/uL (ref 0.7–3.1)
Lymphs: 36 %
MCH: 31 pg (ref 26.6–33.0)
MCHC: 34.3 g/dL (ref 31.5–35.7)
MCV: 90 fL (ref 79–97)
Monocytes Absolute: 0.6 10*3/uL (ref 0.1–0.9)
Monocytes: 9 %
Neutrophils Absolute: 3.4 10*3/uL (ref 1.4–7.0)
Neutrophils: 53 %
Platelets: 258 10*3/uL (ref 150–379)
RBC: 5.22 x10E6/uL (ref 4.14–5.80)
RDW: 14.2 % (ref 12.3–15.4)
WBC: 6.4 10*3/uL (ref 3.4–10.8)

## 2015-06-28 LAB — ALPHA-GAL PANEL
Alpha Gal IgE*: 15 kU/L — ABNORMAL HIGH (ref <0.35)
Beef (Bos spp) IgE: 13.8 kU/L — ABNORMAL HIGH (ref <0.35)
Class Interpretation: 2
Class Interpretation: 3
Class Interpretation: 3
Lamb/Mutton (Ovis spp) IgE: 3.36 kU/L — ABNORMAL HIGH (ref <0.35)
Pork (Sus spp) IgE: 9.52 kU/L — ABNORMAL HIGH (ref <0.35)

## 2015-06-29 NOTE — Progress Notes (Signed)
Spoke with Eugene Taylor he will avoid mammal meat.

## 2015-06-29 NOTE — Progress Notes (Signed)
Left message for Eugene Taylor to call us back

## 2015-07-03 ENCOUNTER — Ambulatory Visit: Payer: Self-pay | Admitting: Allergy and Immunology

## 2015-11-27 DIAGNOSIS — G4733 Obstructive sleep apnea (adult) (pediatric): Secondary | ICD-10-CM

## 2015-11-27 DIAGNOSIS — Z131 Encounter for screening for diabetes mellitus: Secondary | ICD-10-CM | POA: Insufficient documentation

## 2015-11-27 DIAGNOSIS — I10 Essential (primary) hypertension: Secondary | ICD-10-CM

## 2015-11-27 DIAGNOSIS — E782 Mixed hyperlipidemia: Secondary | ICD-10-CM

## 2015-11-27 DIAGNOSIS — E119 Type 2 diabetes mellitus without complications: Secondary | ICD-10-CM

## 2015-11-27 HISTORY — DX: Mixed hyperlipidemia: E78.2

## 2015-11-27 HISTORY — DX: Obstructive sleep apnea (adult) (pediatric): G47.33

## 2015-11-27 HISTORY — DX: Type 2 diabetes mellitus without complications: E11.9

## 2015-11-27 HISTORY — DX: Encounter for screening for diabetes mellitus: Z13.1

## 2015-11-27 HISTORY — DX: Essential (primary) hypertension: I10

## 2015-12-28 DIAGNOSIS — K645 Perianal venous thrombosis: Secondary | ICD-10-CM

## 2015-12-28 HISTORY — DX: Perianal venous thrombosis: K64.5

## 2016-02-08 DIAGNOSIS — J22 Unspecified acute lower respiratory infection: Secondary | ICD-10-CM

## 2016-02-08 HISTORY — DX: Unspecified acute lower respiratory infection: J22

## 2016-04-25 DIAGNOSIS — I300 Acute nonspecific idiopathic pericarditis: Secondary | ICD-10-CM

## 2016-04-25 HISTORY — DX: Acute nonspecific idiopathic pericarditis: I30.0

## 2016-10-11 ENCOUNTER — Ambulatory Visit: Payer: BC Managed Care – PPO | Admitting: Sports Medicine

## 2016-10-16 ENCOUNTER — Ambulatory Visit (INDEPENDENT_AMBULATORY_CARE_PROVIDER_SITE_OTHER): Payer: BC Managed Care – PPO | Admitting: Sports Medicine

## 2016-10-16 ENCOUNTER — Ambulatory Visit (INDEPENDENT_AMBULATORY_CARE_PROVIDER_SITE_OTHER): Payer: BC Managed Care – PPO

## 2016-10-16 ENCOUNTER — Encounter: Payer: Self-pay | Admitting: Sports Medicine

## 2016-10-16 DIAGNOSIS — Q667 Congenital pes cavus, unspecified foot: Secondary | ICD-10-CM

## 2016-10-16 DIAGNOSIS — M79672 Pain in left foot: Secondary | ICD-10-CM

## 2016-10-16 DIAGNOSIS — M722 Plantar fascial fibromatosis: Secondary | ICD-10-CM | POA: Diagnosis not present

## 2016-10-16 MED ORDER — METHYLPREDNISOLONE 4 MG PO TBPK: ORAL_TABLET | ORAL | 0 refills | Status: DC

## 2016-10-16 MED ORDER — TRIAMCINOLONE ACETONIDE 40 MG/ML IJ SUSP: 20.0000 mg | Freq: Once | INTRAMUSCULAR | Status: DC

## 2016-10-16 NOTE — Progress Notes (Signed)
Subjective: Eugene Taylor is a 66 y.o. male patient presents to office with complaint of heel pain on the left. Patient admits to post static dyskinesia for 1.5 months in duration. Patient has treated this problem with change of shoes with a little relief. Denies any other pedal complaints.   Patient Active Problem List   Diagnosis Date Noted  . Acute idiopathic pericarditis 04/25/2016  . Lower respiratory infection 02/08/2016  . External hemorrhoid, thrombosed 12/28/2015  . Benign hypertension 11/27/2015  . Mixed hyperlipidemia 11/27/2015  . Obstructive sleep apnea 11/27/2015  . Screening for diabetes mellitus (DM) 11/27/2015  . Acute medial meniscal tear 12/15/2014    Current Outpatient Prescriptions on File Prior to Visit  Medication Sig Dispense Refill  . Cholecalciferol (VITAMIN D3) 1000 UNITS CAPS Take 1 capsule by mouth daily.    Marland Kitchen EPIPEN 2-PAK 0.3 MG/0.3ML SOAJ injection     . HYDROcodone-acetaminophen (NORCO) 5-325 MG per tablet Take 1-2 tablets by mouth every 6 (six) hours as needed for moderate pain. (Patient not taking: Reported on 06/21/2015) 30 tablet 0  . lisinopril (PRINIVIL,ZESTRIL) 10 MG tablet Take 10 mg by mouth daily.    Marland Kitchen losartan (COZAAR) 50 MG tablet Take 1 tablet (50 mg total) by mouth daily. 30 tablet 1  . methocarbamol (ROBAXIN) 500 MG tablet Take 1 tablet (500 mg total) by mouth 4 (four) times daily. As needed for muscle spasm (Patient not taking: Reported on 06/21/2015) 30 tablet 1  . Multiple Vitamin (MULTIVITAMIN) tablet Take 1 tablet by mouth daily.    . predniSONE (DELTASONE) 20 MG tablet     . Probiotic Product (PROBIOTIC DAILY PO) Take 1 capsule by mouth daily.    . ranitidine (ZANTAC) 150 MG tablet Take 150 mg by mouth as needed for heartburn.    . rosuvastatin (CRESTOR) 10 MG tablet Take 10 mg by mouth daily.     No current facility-administered medications on file prior to visit.     Allergies  Allergen Reactions  . Penicillins Hives and Other  (See Comments)    Unsure of reaction- happened as a young child. Unsure of reaction- happened as a young child. Unsure of reaction- happened as a young child.  . Hydrocodeine [Dihydrocodeine] Itching    Objective: Physical Exam General: The patient is alert and oriented x3 in no acute distress.  Dermatology: Skin is warm, dry and supple bilateral lower extremities. Nails 1-10 are normal. There is no erythema, edema, no eccymosis, no open lesions present. Integument is otherwise unremarkable.  Vascular: Dorsalis Pedis pulse and Posterior Tibial pulse are 2/4 bilateral. Capillary fill time is immediate to all digits.  Neurological: Grossly intact to light touch with an achilles reflex of +2/5 and a  negative Tinel's sign bilateral.  Musculoskeletal: Tenderness to palpation at the medial calcaneal tubercale and through the insertion of the plantar fascia on the left foot. No pain with compression of calcaneus bilateral. No pain with tuning fork to calcaneus bilateral. No pain with calf compression bilateral. There is decreased Ankle joint range of motion bilateral. All other joints range of motion within normal limits bilateral. Strength 5/5 in all groups bilateral. Pes cavus foot type bilateral.  Xray, Left foot:  Normal osseous mineralization. Joint spaces preserved. Pes cavus. No fracture/dislocation/boney destruction. No Calcaneal spur present with mild thickening of plantar fascia. No other soft tissue abnormalities or radiopaque foreign bodies.   Assessment and Plan: Problem List Items Addressed This Visit    None    Visit Diagnoses  Left foot pain    -  Primary   Relevant Medications   methylPREDNISolone (MEDROL DOSEPAK) 4 MG TBPK tablet   Other Relevant Orders   DG Foot 2 Views Left   Plantar fasciitis of left foot       Relevant Medications   triamcinolone acetonide (KENALOG-40) injection 20 mg (Start on 10/16/2016  9:15 AM)   methylPREDNISolone (MEDROL DOSEPAK) 4 MG TBPK  tablet   Pes cavus          -Complete examination performed.  -Xrays reviewed -Discussed with patient in detail the condition of plantar fasciitis, how this occurs and general treatment options. Explained both conservative and surgical treatments.  -After oral consent and aseptic prep, injected a mixture containing 1 ml of 2% plain lidocaine, 1 ml 0.5% plain marcaine, 0.5 ml of kenalog 40 and 0.5 ml of dexamethasone phosphate into left heel. Post-injection care discussed with patient.  -RxMedrol dose pack to start after colonoscopy  -Recommended good supportive shoes and advised use of OTC insert. Explained to patient that if these orthoses work well, we will continue with these. If these do not improve his condition and  pain, we will consider custom molded orthoses. - Explained in detail the use of the fascial brace for left which was dispensed at today's visit. -Explained and dispensed to patient daily stretching exercises. -Recommend patient to ice affected area 1-2x daily. -Patient to return to office in 4 weeks for follow up or sooner if problems or questions arise.  Asencion Islamitorya Zaccai Chavarin, DPM

## 2016-10-16 NOTE — Patient Instructions (Signed)

## 2016-11-13 ENCOUNTER — Ambulatory Visit: Payer: BC Managed Care – PPO | Admitting: Sports Medicine

## 2017-09-23 DIAGNOSIS — Z471 Aftercare following joint replacement surgery: Secondary | ICD-10-CM | POA: Insufficient documentation

## 2017-10-19 DIAGNOSIS — Z96651 Presence of right artificial knee joint: Secondary | ICD-10-CM | POA: Insufficient documentation

## 2017-12-23 DIAGNOSIS — I300 Acute nonspecific idiopathic pericarditis: Secondary | ICD-10-CM

## 2017-12-23 HISTORY — DX: Acute nonspecific idiopathic pericarditis: I30.0

## 2017-12-23 NOTE — Progress Notes (Signed)
Cardiology Office Note:    Date:  12/24/2017   ID:  Eugene Taylor, DOB 01-16-1951, MRN [id number]  PCP:  Eugene Bass, MD  Cardiologist:  [name], MD    Referring MD: Eugene Bass, MD    ASSESSMENT:    1. Essential hypertension   2. Pericardial disease    PLAN:    In order of problems listed above:  1. Stable blood pressure target continue ARB and if difficulty accessing his agent he could use an alternative such as candesartan 2. Stable no recurrence of pericarditis I do not think he requires any cardiac imaging at this time   Next appointment: One year   Medication Adjustments/Labs and Tests Ordered: Current medicines are reviewed at length with the patient today.  Concerns regarding medicines are outlined above.  No orders of the defined types were placed in this encounter.  No orders of the defined types were placed in this encounter.   Chief Complaint  Patient presents with  . Follow-up    pericarditis 2017  . Hypertension  . Hyperlipidemia    History of Present Illness:    Eugene Taylor is a 67 y.o. male with a hx of hypertension and pericarditis last seen by cardiology Dr Wille Glaser [facility] in September 2017.  05/07/16: Assessment & Plan: 1. Acute idiopathic pericarditis ECG 12 Lead  ECG 12 Lead  resolving  2. Hypertension, essential valsartan (DIOVAN) 80 MG tablet  ECG 12 Lead  ECG 12 Lead  1. Pericarditis has clinically resolved 2. HBP is adequately controlled PLAN: D/c indomethacin now. Continue colchicine until Thanksgiving, then stop. Cardiology f/u can be on PRN basis thereafter - f/u Dr. 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  He is more functionally active blood pressure at home less than 140 systolic no chest pain shortness of breath palpitation or claudication. Past Medical History:    Diagnosis Date  . Acute idiopathic pericarditis 04/25/2016   Overview:  2017: hosp, EKG changes, colchicine/indomethacin  . Acute medial meniscal tear 12/15/2014  . Arthritis    LEFT SHOULDER  . Asthma   . Benign hypertension 11/27/2015  . External hemorrhoid, thrombosed 12/28/2015   Overview:  2017:   . History of adenomatous polyp of colon   . History of kidney stones   . Hyperlipidemia   . Hypertension   . Lower respiratory infection 02/08/2016  . Mixed hyperlipidemia 11/27/2015  . Obstructive sleep apnea 11/27/2015  . OSA on CPAP   . Right knee meniscal tear   . Screening for diabetes mellitus (DM) 11/27/2015  . Wears glasses     Past Surgical History:  Procedure Laterality Date  . APPENDECTOMY  age 48  . CHONDROPLASTY Right 12/15/2014   Procedure: CHONDROPLASTY;  Surgeon: Ollen Gross, MD;  Location: [facility];  Service: Orthopedics;  Laterality: Right;  . COLONOSCOPY W/ POLYPECTOMY  2011  . CYSTO/  URETEROSCOPIC STONE EXTRACTIONS  1995  . KNEE ARTHROSCOPY WITH LATERAL MENISECTOMY Right 12/15/2014   Procedure: KNEE ARTHROSCOPY WITH LATERAL MENISECTOMY;  Surgeon: Ollen Gross, MD;  Location: [facility];  Service: Orthopedics;  Laterality: Right;  . KNEE ARTHROSCOPY WITH MEDIAL MENISECTOMY Right 12/15/2014   Procedure: KNEE ARTHROSCOPY WITH MEDIAL MENISECTOMY;  Surgeon: Ollen Gross, MD;  Location: [facility];  Service: Orthopedics;  Laterality: Right;  . NASAL SEPTUM SURGERY  1995  Current Medications: Current Meds  Medication Sig  . EPIPEN 2-PAK 0.3 MG/0.3ML SOAJ injection   . losartan (COZAAR) 50 MG tablet Take 1 tablet (50 mg total) by mouth daily.  . Probiotic Product (PROBIOTIC DAILY PO) Take 1 capsule by mouth daily.  . ranitidine (ZANTAC) 150 MG tablet Take 150 mg by mouth as needed for heartburn.  . [DISCONTINUED] rosuvastatin (CRESTOR) 10 MG tablet Take 10 mg by mouth daily.   Current Facility-Administered Medications  for the 12/24/17 encounter (Office Visit) with Baldo Daub, MD  Medication  . triamcinolone acetonide (KENALOG-40) injection 20 mg     Allergies:   Penicillins and Hydrocodeine [dihydrocodeine]   Social History   Socioeconomic History  . Marital status: Married    Spouse name: Not on file  . Number of children: Not on file  . Years of education: Not on file  . Highest education level: Not on file  Occupational History  . Not on file  Social Needs  . Financial resource strain: Not on file  . Food insecurity:    Worry: Not on file    Inability: Not on file  . Transportation needs:    Medical: Not on file    Non-medical: Not on file  Tobacco Use  . Smoking status: Never Smoker  . Smokeless tobacco: Never Used  Substance and Sexual Activity  . Alcohol use: No  . Drug use: No  . Sexual activity: Not on file  Lifestyle  . Physical activity:    Days per week: Not on file    Minutes per session: Not on file  . Stress: Not on file  Relationships  . Social connections:    Talks on phone: Not on file    Gets together: Not on file    Attends religious service: Not on file    Active member of club or organization: Not on file    Attends meetings of clubs or organizations: Not on file    Relationship status: Not on file  Other Topics Concern  . Not on file  Social History Narrative  . Not on file     Family History: The patient's family history includes Pancreatic cancer in his father; Stomach cancer in his mother. ROS:   Please see the history of present illness.    All other systems reviewed and are negative.  EKGs/Labs/Other Studies Reviewed:    The following studies were reviewed today:  EKG:  EKG ordered today.  The ekg ordered today demonstrates sinus rhythm minor ST abnormality otherwise normal  Recent Labs: BMP normal 01/10/17 No results found for requested labs within last 8760 hours.  Recent Lipid Panel 01/10/17 LDL 142 No results found for: CHOL, TRIG,  HDL, CHOLHDL, VLDL, LDLCALC, LDLDIRECT  Physical Exam:    VS:  BP 134/80 (BP Location: Right Arm, Patient Position: Sitting, Cuff Size: Normal)   Pulse 81   Ht 5' 8.89" (1.75 m)   Wt 212 lb 12.8 oz (96.5 kg)   SpO2 98%   BMI 31.53 kg/m     Wt Readings from Last 3 Encounters:  12/24/17 212 lb 12.8 oz (96.5 kg)  06/21/15 213 lb 3 oz (96.7 kg)  12/15/14 203 lb (92.1 kg)     GEN:  Well nourished, well developed in no acute distress HEENT: Normal NECK: No JVD; No carotid bruits LYMPHATICS: No lymphadenopathy CARDIAC: RRR, no murmurs, rubs, gallops RESPIRATORY:  Clear to auscultation without rales, wheezing or rhonchi  ABDOMEN: Soft, non-tender, non-distended MUSCULOSKELETAL:  No  edema; No deformity  SKIN: Warm and dry NEUROLOGIC:  Alert and oriented x 3 PSYCHIATRIC:  Normal affect    Signed, [name], MD  12/24/2017 9:36 AM    [facility]

## 2017-12-24 ENCOUNTER — Ambulatory Visit: Payer: BC Managed Care – PPO | Admitting: Cardiology

## 2017-12-24 ENCOUNTER — Encounter: Payer: Self-pay | Admitting: Cardiology

## 2017-12-24 VITALS — BP 134/80 | HR 81 | Ht 68.89 in | Wt 212.8 lb

## 2017-12-24 DIAGNOSIS — I10 Essential (primary) hypertension: Secondary | ICD-10-CM

## 2017-12-24 DIAGNOSIS — I319 Disease of pericardium, unspecified: Secondary | ICD-10-CM | POA: Diagnosis not present

## 2017-12-24 MED ORDER — LOSARTAN POTASSIUM 50 MG PO TABS: 50.0000 mg | ORAL_TABLET | Freq: Every day | ORAL | 3 refills | Status: DC

## 2017-12-24 NOTE — Patient Instructions (Addendum)
Medication Instructions:  Your physician recommends that you continue on your current medications as directed. Please refer to the Current Medication list given to you today.  Labwork: None  Testing/Procedures: None  Follow-Up: Your physician wants you to follow-up in: 1 year. You will receive a reminder letter in the mail two months in advance. If you don't receive a letter, please call our office to schedule the follow-up appointment.  Any Other Special Instructions Will Be Listed Below (If Applicable).     If you need a refill on your cardiac medications before your next appointment, please call your pharmacy.    You can use tonic water with quinine at bedtime 6-8 ounces-[place] or [place] Dry

## 2018-01-01 DIAGNOSIS — Z91018 Allergy to other foods: Secondary | ICD-10-CM

## 2018-01-01 HISTORY — DX: Allergy to other foods: Z91.018

## 2018-04-30 DIAGNOSIS — I209 Angina pectoris, unspecified: Secondary | ICD-10-CM | POA: Insufficient documentation

## 2018-04-30 DIAGNOSIS — Z8679 Personal history of other diseases of the circulatory system: Secondary | ICD-10-CM | POA: Insufficient documentation

## 2018-04-30 HISTORY — DX: Personal history of other diseases of the circulatory system: Z86.79

## 2018-05-04 NOTE — Progress Notes (Signed)
Cardiology Office Note:    Date:  05/05/2018   ID:  [name], DOB September 21, 1950, MRN [id number]  PCP:  Olive Bass, MD  Cardiologist:  [name], MD    Referring MD: Olive Bass, MD    ASSESSMENT:    1. Chest pain in adult   2. Essential hypertension    PLAN:    In order of problems listed above:  1. His chest pain is quite atypical raises the possibility of pericarditis but no fever chills or EKG changes.  To screen her for recurrent pericarditis will undergo C-reactive protein high sensitivity sedimentation rate and echo cardiogram.  I suspect this is multiple skeletal. 2. Poorly controlled switch to a more potent ARB with diuretic   Next appointment: as needed providing labs echocardiogram abnormal   Medication Adjustments/Labs and Tests Ordered: Current medicines are reviewed at length with the patient today.  Concerns regarding medicines are outlined above.  Orders Placed This Encounter  Procedures  . CBC  . Sedimentation rate  . CRP High sensitivity  . EKG 12-Lead  . ECHOCARDIOGRAM COMPLETE   Meds ordered this encounter  Medications  . telmisartan-hydrochlorothiazide (MICARDIS HCT) 40-12.5 MG tablet    Sig: Take 1 tablet by mouth daily.    Dispense:  30 tablet    Refill:  3    Chief Complaint  Patient presents with  . Chest Pain  . Hypertension    History of Present Illness:    Eugene Taylor is a 67 y.o. male with a hx of hypertension and pericarditis in August 2017  last seen 12/26/16. Compliance with diet, lifestyle and medications: yes  1 week ago did vigorous garden work with tiller and afterwards developed chest pain he was nonexertional sharp localized to the sternum worse with movement worse with deep breath but not more supine.  No associated fever chills and waxed and waned for several days and now is sore in his neck.  He has a background history of pericarditis and is worried of recurrence.  He tells me when he saw his PCP in  the office he had chest wall tenderness he is also concerned that his blood pressure is above target on serial measurements. Past Medical History:  Diagnosis Date  . Acute idiopathic pericarditis 04/25/2016   Overview:  2017: hosp, EKG changes, colchicine/indomethacin  . Acute medial meniscal tear 12/15/2014  . Arthritis    LEFT SHOULDER  . Asthma   . Benign hypertension 11/27/2015  . External hemorrhoid, thrombosed 12/28/2015   Overview:  2017:   . History of adenomatous polyp of colon   . History of kidney stones   . Hyperlipidemia   . Hypertension   . Lower respiratory infection 02/08/2016  . Mixed hyperlipidemia 11/27/2015  . Obstructive sleep apnea 11/27/2015  . OSA on CPAP   . Right knee meniscal tear   . Screening for diabetes mellitus (DM) 11/27/2015  . Wears glasses     Past Surgical History:  Procedure Laterality Date  . APPENDECTOMY  age 56  . CHONDROPLASTY Right 12/15/2014   Procedure: CHONDROPLASTY;  Surgeon: Ollen Gross, MD;  Location: [facility];  Service: Orthopedics;  Laterality: Right;  . COLONOSCOPY W/ POLYPECTOMY  2011  . CYSTO/  URETEROSCOPIC STONE EXTRACTIONS  1995  . KNEE ARTHROSCOPY WITH LATERAL MENISECTOMY Right 12/15/2014   Procedure: KNEE ARTHROSCOPY WITH LATERAL MENISECTOMY;  Surgeon: Ollen Gross, MD;  Location: [facility];  Service: Orthopedics;  Laterality: Right;  . KNEE  ARTHROSCOPY WITH MEDIAL MENISECTOMY Right 12/15/2014   Procedure: KNEE ARTHROSCOPY WITH MEDIAL MENISECTOMY;  Surgeon: Ollen Gross, MD;  Location: [facility];  Service: Orthopedics;  Laterality: Right;  . NASAL SEPTUM SURGERY  1995    Current Medications: Current Meds  Medication Sig  . EPIPEN 2-PAK 0.3 MG/0.3ML SOAJ injection   . ranitidine (ZANTAC) 150 MG tablet Take 150 mg by mouth as needed for heartburn.  . [DISCONTINUED] losartan (COZAAR) 50 MG tablet Take 1 tablet (50 mg total) by mouth daily.   Current Facility-Administered  Medications for the 05/05/18 encounter (Office Visit) with Baldo Daub, MD  Medication  . triamcinolone acetonide (KENALOG-40) injection 20 mg     Allergies:   Penicillins and Hydrocodeine [dihydrocodeine]   Social History   Socioeconomic History  . Marital status: Married    Spouse name: Not on file  . Number of children: Not on file  . Years of education: Not on file  . Highest education level: Not on file  Occupational History  . Not on file  Social Needs  . Financial resource strain: Not on file  . Food insecurity:    Worry: Not on file    Inability: Not on file  . Transportation needs:    Medical: Not on file    Non-medical: Not on file  Tobacco Use  . Smoking status: Never Smoker  . Smokeless tobacco: Never Used  Substance and Sexual Activity  . Alcohol use: No  . Drug use: No  . Sexual activity: Not on file  Lifestyle  . Physical activity:    Days per week: Not on file    Minutes per session: Not on file  . Stress: Not on file  Relationships  . Social connections:    Talks on phone: Not on file    Gets together: Not on file    Attends religious service: Not on file    Active member of club or organization: Not on file    Attends meetings of clubs or organizations: Not on file    Relationship status: Not on file  Other Topics Concern  . Not on file  Social History Narrative  . Not on file     Family History: The patient's family history includes Pancreatic cancer in his father; Stomach cancer in his mother. ROS:   Please see the history of present illness.    All other systems reviewed and are negative.  EKGs/Labs/Other Studies Reviewed:    The following studies were reviewed today:  EKG:  EKG ordered today.  The ekg ordered today demonstrates SRTH normal no ST elevation  Recent Labs:   01/01/18 CMP normal Chol 232 LDL 186 HDL 46 TG 161   Physical Exam:    VS:  BP (!) 162/96 (BP Location: Right Arm, Patient Position: Sitting, Cuff Size:  Normal)   Pulse 82   Ht 5\' 8"  (1.727 m)   Wt 212 lb 9.6 oz (96.4 kg)   SpO2 98%   BMI 32.33 kg/m     Wt Readings from Last 3 Encounters:  05/05/18 212 lb 9.6 oz (96.4 kg)  12/24/17 212 lb 12.8 oz (96.5 kg)  06/21/15 213 lb 3 oz (96.7 kg)     GEN:  Well nourished, well developed in no acute distress HEENT: Normal NECK: No JVD; No carotid bruits LYMPHATICS: No lymphadenopathy CARDIAC: RRR, no murmurs, rubs, gallops RESPIRATORY:  Clear to auscultation without rales, wheezing or rhonchi  ABDOMEN: Soft, non-tender, non-distended MUSCULOSKELETAL:  No edema;  No deformity  SKIN: Warm and dry NEUROLOGIC:  Alert and oriented x 3 PSYCHIATRIC:  Normal affect    Signed, [name], MD  05/05/2018 5:29 PM    [facility]

## 2018-05-05 ENCOUNTER — Encounter: Payer: Self-pay | Admitting: *Deleted

## 2018-05-05 ENCOUNTER — Ambulatory Visit: Payer: Medicare Other | Admitting: Cardiology

## 2018-05-05 VITALS — BP 162/96 | HR 82 | Ht 68.0 in | Wt 212.6 lb

## 2018-05-05 DIAGNOSIS — I10 Essential (primary) hypertension: Secondary | ICD-10-CM | POA: Diagnosis not present

## 2018-05-05 DIAGNOSIS — I209 Angina pectoris, unspecified: Secondary | ICD-10-CM

## 2018-05-05 DIAGNOSIS — R079 Chest pain, unspecified: Secondary | ICD-10-CM

## 2018-05-05 HISTORY — DX: Angina pectoris, unspecified: I20.9

## 2018-05-05 MED ORDER — TELMISARTAN-HCTZ 40-12.5 MG PO TABS: 1.0000 | ORAL_TABLET | Freq: Every day | ORAL | 3 refills | Status: DC

## 2018-05-05 NOTE — Patient Instructions (Addendum)
Medication Instructions:  Your physician has recommended you make the following change in your medication:   STOP losartan  START telmisartan-hydrochlorothiazide 40-12.5 mg daily   Labwork: Your physician recommends that you return for lab work today: CBC, Sedimentation rate, CRP-high sensitivity.   Testing/Procedures: You had an EKG today.  Your physician has requested that you have an echocardiogram. Echocardiography is a painless test that uses sound waves to create images of your heart. It provides your doctor with information about the size and shape of your heart and how well your heart's chambers and valves are working. This procedure takes approximately one hour. There are no restrictions for this procedure.  Follow-Up: Your physician recommends that you schedule a follow-up appointment as needed if symptoms worsen or fail to improve.   If you need a refill on your cardiac medications before your next appointment, please call your pharmacy.   Thank you for choosing [facility]! Robyne Peers, RN [phone]   Echocardiogram An echocardiogram, or echocardiography, uses sound waves (ultrasound) to produce an image of your heart. The echocardiogram is simple, painless, obtained within a short period of time, and offers valuable information to your health care provider. The images from an echocardiogram can provide information such as:  Evidence of coronary artery disease (CAD).  Heart size.  Heart muscle function.  Heart valve function.  Aneurysm detection.  Evidence of a past heart attack.  Fluid buildup around the heart.  Heart muscle thickening.  Assess heart valve function.  Tell a health care provider about:  Any allergies you have.  All medicines you are taking, including vitamins, herbs, eye drops, creams, and over-the-counter medicines.  Any problems you or family members have had with anesthetic medicines.  Any blood disorders you  have.  Any surgeries you have had.  Any medical conditions you have.  Whether you are pregnant or may be pregnant. What happens before the procedure? No special preparation is needed. Eat and drink normally. What happens during the procedure?  In order to produce an image of your heart, gel will be applied to your chest and a wand-like tool (transducer) will be moved over your chest. The gel will help transmit the sound waves from the transducer. The sound waves will harmlessly bounce off your heart to allow the heart images to be captured in real-time motion. These images will then be recorded.  You may need an IV to receive a medicine that improves the quality of the pictures. What happens after the procedure? You may return to your normal schedule including diet, activities, and medicines, unless your health care provider tells you otherwise. This information is not intended to replace advice given to you by your health care provider. Make sure you discuss any questions you have with your health care provider. Document Released: 08/09/2000 Document Revised: 03/30/2016 Document Reviewed: 04/19/2013 Elsevier Interactive Patient Education  2017 [employer].    Erythrocyte Sedimentation Rate Why am I having this test? The erythrocyte sedimentation rate (ESR) test is used to find illnesses associated with: Acute and chronic infection. Inflammation. Noncancerous abnormal tissue growth. Tissue death.  This test measures how long it takes for your red blood cells to settle in a saline or plasma solution over a specified amount of time. If you have vague symptoms associated with these illnesses, your health care provider may do this test before doing more specific tests. This test can also be used to help monitor therapy if you have an inflammatory immune disease, such as rheumatoid  arthritis. What kind of sample is taken? A blood sample is required for this test. It is usually collected  by inserting a needle into a vein. How do I prepare for this test? There is no preparation required for this test. Your health care provider will tell you if you need to stop any of your medicines before the test. Some medicines can affect the test results. What are the reference values? Reference values are considered healthy values established after testing a large group of healthy people. Reference values may vary among different people, labs, and hospitals. It is your responsibility to obtain your test results. Ask the lab or department performing the test when and how you will get your results. Reference values using the Westergren method are the following: Male: up to 15 mm/hr. Male:up to 20 mm/hr. Child: up to 10 mm/hr. Newborn: 0-2 mm/hr.  The ESR results depend on which test the lab uses. What do the results mean? Increased ESR levels may indicate: Kidney failure. Certain types of cancer. Inflammatory diseases. Tissue death or damage. Severe anemia.  Certain diseases may cause ESR levels to be falsely decreased. Talk with your health care provider to discuss your results, treatment options, and if necessary, the need for more tests. Talk with your health care provider if you have any questions about your results. Talk with your health care provider to discuss your results, treatment options, and if necessary, the need for more tests. Talk with your health care provider if you have any questions about your results. This information is not intended to replace advice given to you by your health care provider. Make sure you discuss any questions you have with your health care provider. Document Released: 09/04/2004 Document Revised: 04/16/2016 Document Reviewed: 01/07/2014 Elsevier Interactive Patient Education  2018 [employer].   High-Sensitivity C-Reactive Protein Test High-sensitivity C-reactive protein (hs-CRP) test is a blood test that is done to determine the level of  C-reactive protein in your body. C-reactive protein is a protein made by your liver. When there is inflammation in your body, the level of C-reactive protein goes up. Long-term inflammation can lead to heart and blood vessel diseases. This test may be done to estimate your risk of heart and blood vessel diseases, including heart attack and stroke. You may have this test if you are a man aged 42 years or older or a woman aged 57 years or older. This test requires a blood sample from a vein in your arm or hand. You may have this test with another test called a lipid profile. How do I prepare for this test?  Tell your health care provider if you have recently had: ? An infection. ? Surgery. ? An injury. ? An illness.  Tell your health care provider if you are taking any medicines or birth control pills.  If you are having the hs-CRP test along with a lipid profile, do not eat or drink anything after midnight on the night before the test or as directed by your health care provider. What do the results mean? It is your responsibility to obtain your test results. Ask the lab or department performing the test when and how you will get your results. Contact your health care provider to discuss any questions you have about your results. The results of the hs-CRP are given in milligrams per liter (mg/L). Range of Normal Values Ranges for normal values vary among different labs and hospitals. You should always check with your health care provider after  having lab work or other tests done to discuss whether your values are considered within normal limits. A normal hs-CRP level is less than 3.0 mg/L. If your hs-CRP level is less than 1.0 mg/L, your risk of developing heart or blood vessel disease is low. If your hs-CRP level is between 1.0 and 3.0 mg/L, your risk is average. Meaning of Results Outside Normal Value Ranges If your hs-CRP level is 3.0 mg/L or higher, your risk of developing heart or blood  vessel disease is high. Talk to your health care provider about your results. Talk with your health care provider to discuss your results, treatment options, and if necessary, the need for more tests. Talk with your health care provider if you have any questions about your results. This information is not intended to replace advice given to you by your health care provider. Make sure you discuss any questions you have with your health care provider. Document Released: 09/05/2004 Document Revised: 04/17/2016 Document Reviewed: 11/18/2013 Elsevier Interactive Patient Education  2018 [employer].   Hydrochlorothiazide, HCTZ; Telmisartan tablets What is this medicine? TELMISARTAN; HYDROCHLOROTHIAZIDE (tel mi SAR tan; hye droe klor oh THYE a zide) is a combination of a drug that relaxes blood vessels and a diuretic. It is used to treat high blood pressure. This medicine may be used for other purposes; ask your health care provider or pharmacist if you have questions. COMMON BRAND NAME(S): Micardis HCT What should I tell my health care provider before I take this medicine? They need to know if you have any of these conditions: -decreased urine -if you are on a special diet, like a low-salt diet -immune system problems, like lupus -kidney disease -liver disease -an unusual or allergic reaction to telmisartan, hydrochlorothiazide, sulfa drugs, other medicines, foods, dyes, or preservatives -pregnant or trying to get pregnant -breast-feeding How should I use this medicine? Take this medicine by mouth with a drink of water. Follow the directions on the prescription label. You can take it with or without food. If it upsets your stomach, take it with food. Take your medicine at regular intervals. Do not take it more often than directed. Do not stop taking except on your doctor's advice. Talk to your pediatrician regarding the use of this medicine in children. Special care may be needed. Overdosage: If  you think you have taken too much of this medicine contact a poison control center or emergency room at once. NOTE: This medicine is only for you. Do not share this medicine with others. What if I miss a dose? If you miss a dose, take it as soon as you can. If it is almost time for your next dose, take only that dose. Do not take double or extra doses. What may interact with this medicine? -barbiturates, like phenobarbital -blood pressure medicines -corticosteroids -diabetic medicines -digoxin -diuretics, especially triamterene, spironolactone or amiloride -lithium -NSAIDs, medicines for pain and inflammation, like ibuprofen or naproxen -potassium salts or potassium supplements -prescription pain medicines -skeletal muscle relaxants like tubocurarine -some cholesterol lowering medicines like cholestyramine or colestipol -warfarin This list may not describe all possible interactions. Give your health care provider a list of all the medicines, herbs, non-prescription drugs, or dietary supplements you use. Also tell them if you smoke, drink alcohol, or use illegal drugs. Some items may interact with your medicine. What should I watch for while using this medicine? Check your blood pressure regularly while you are taking this medicine. Ask your doctor or health care professional what your blood  pressure should be and when you should contact him or her. When you check your blood pressure, write down the measurements to show your doctor or health care professional. If you are taking this medicine for a long time, you must visit your health care professional for regular checks on your progress. Make sure you schedule appointments on a regular basis. You must not get dehydrated. Ask your doctor or health care professional how much fluid you need to drink a day. Check with him or her if you get an attack of severe diarrhea, nausea and vomiting, or if you sweat a lot. The loss of too much body fluid can  make it dangerous for you to take this medicine. Women should inform their doctor if they wish to become pregnant or think they might be pregnant. There is a potential for serious side effects to an unborn child, particularly in the second or third trimester. Talk to your health care professional or pharmacist for more information. You may get drowsy or dizzy. Do not drive, use machinery, or do anything that needs mental alertness until you know how this drug affects you. Do not stand or sit up quickly, especially if you are an older patient. This reduces the risk of dizzy or fainting spells. Alcohol can make you more drowsy and dizzy. Avoid alcoholic drinks. This medicine may affect your blood sugar level. If you have diabetes, check with your doctor or health care professional before changing the dose of your diabetic medicine. Avoid salt substitutes unless you are told otherwise by your doctor or health care professional. Do not treat yourself for coughs, colds, or pain while you are taking this medicine without asking your doctor or health care professional for advice. Some ingredients may increase your blood pressure. This medicine can make you more sensitive to the sun. Keep out of the sun. If you cannot avoid being in the sun, wear protective clothing and use sunscreen. Do not use sun lamps or tanning beds/booths. What side effects may I notice from receiving this medicine? Side effects that you should report to your doctor or health care professional as soon as possible: -allergic reactions like skin rash, itching or hives, swelling of the face, lips, or tongue -breathing problems -changes in vision -dark urine -eye pain -fast or irregular heart beat, palpitations, or chest pain -feeling faint or lightheaded -muscle cramps -persistent dry cough -redness, blistering, peeling or loosening of the skin, including inside the mouth -stomach pain -trouble passing urine or change in the amount of  urine -unusual bleeding or bruising -worsened gout pain -yellowing of the eyes or skin Side effects that usually do not require medical attention (report to your doctor or health care professional if they continue or are bothersome): -change in sex drive or performance -headache This list may not describe all possible side effects. Call your doctor for medical advice about side effects. You may report side effects to FDA at 1-800-FDA-1088. Where should I keep my medicine? Keep out of the reach of children. Store at room temperature between 15 and 30 degrees C (59 and 86 degrees F). Tablets should not be removed from the blisters until right before use. Throw away any unused medicine after the expiration date. NOTE: This sheet is a summary. It may not cover all possible information. If you have questions about this medicine, talk to your doctor, pharmacist, or health care provider.  2018 Elsevier/Gold Standard (2010-05-02 14:52:10)

## 2018-05-06 ENCOUNTER — Telehealth: Payer: Self-pay | Admitting: *Deleted

## 2018-05-06 DIAGNOSIS — I319 Disease of pericardium, unspecified: Secondary | ICD-10-CM

## 2018-05-06 LAB — CBC
Hematocrit: 44.8 % (ref 37.5–51.0)
Hemoglobin: 15.6 g/dL (ref 13.0–17.7)
MCH: 29.5 pg (ref 26.6–33.0)
MCHC: 34.8 g/dL (ref 31.5–35.7)
MCV: 85 fL (ref 79–97)
Platelets: 339 10*3/uL (ref 150–450)
RBC: 5.29 x10E6/uL (ref 4.14–5.80)
RDW: 13.7 % (ref 12.3–15.4)
WBC: 7.5 10*3/uL (ref 3.4–10.8)

## 2018-05-06 LAB — HIGH SENSITIVITY CRP: CRP, High Sensitivity: 10.71 mg/L — ABNORMAL HIGH (ref 0.00–3.00)

## 2018-05-06 LAB — SEDIMENTATION RATE: Sed Rate: 44 mm/hr — ABNORMAL HIGH (ref 0–30)

## 2018-05-06 MED ORDER — IBUPROFEN 800 MG PO TABS: 800.0000 mg | ORAL_TABLET | Freq: Three times a day (TID) | ORAL | 0 refills | Status: DC

## 2018-05-06 MED ORDER — COLCHICINE 0.6 MG PO TABS: 0.6000 mg | ORAL_TABLET | Freq: Two times a day (BID) | ORAL | 0 refills | Status: DC

## 2018-05-06 NOTE — Telephone Encounter (Signed)
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 Patient advised of recommendations for treatment, patient agreeable. 30 day supply of colchicine 0.6 mg twice daily and ibuprofen 800 mg three times daily sent to [facility]. Patient advised to come back to our office in 2 weeks to recheck lab work to see if levels are trending down with treatment, no appointment needed. Patient's echocardiogram needed to be done sooner per Dr. Dulce Sellar, so this appointment was moved to the [facility] location on Monday, 05/11/18 at 3:15 pm. Patient was scheduled for a follow up appointment on 05/25/18 at 3:20 pm. Patient informed to avoid any strenuous activity until further notice. Patient verbalized understanding. No further questions.

## 2018-05-11 ENCOUNTER — Ambulatory Visit (HOSPITAL_BASED_OUTPATIENT_CLINIC_OR_DEPARTMENT_OTHER)
Admission: RE | Admit: 2018-05-11 | Discharge: 2018-05-11 | Disposition: A | Discharge status: Home / Self Care | Payer: Medicare Other | Source: Ambulatory Visit | Attending: Cardiology | Admitting: Cardiology

## 2018-05-11 DIAGNOSIS — E785 Hyperlipidemia, unspecified: Secondary | ICD-10-CM | POA: Insufficient documentation

## 2018-05-11 DIAGNOSIS — I313 Pericardial effusion (noninflammatory): Secondary | ICD-10-CM | POA: Diagnosis not present

## 2018-05-11 DIAGNOSIS — I10 Essential (primary) hypertension: Secondary | ICD-10-CM | POA: Diagnosis not present

## 2018-05-11 DIAGNOSIS — I352 Nonrheumatic aortic (valve) stenosis with insufficiency: Secondary | ICD-10-CM | POA: Insufficient documentation

## 2018-05-11 NOTE — Progress Notes (Signed)
  Echocardiogram 2D Echocardiogram has been performed.  [name] [name] 05/11/2018, 4:18 PM

## 2018-05-12 ENCOUNTER — Telehealth: Payer: Self-pay | Admitting: *Deleted

## 2018-05-12 DIAGNOSIS — I319 Disease of pericardium, unspecified: Secondary | ICD-10-CM

## 2018-05-12 NOTE — Telephone Encounter (Signed)
Patient informed of echocardiogram results and advised of further lab testing that will be done when he comes in for lab work on Wednesday, 05/20/18. Patient verbalized understanding. No further questions.

## 2018-05-12 NOTE — Telephone Encounter (Signed)
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

## 2018-05-20 LAB — HIGH SENSITIVITY CRP: CRP, High Sensitivity: 0.68 mg/L (ref 0.00–3.00)

## 2018-05-20 LAB — SEDIMENTATION RATE: Sed Rate: 20 mm/hr (ref 0–30)

## 2018-05-22 DIAGNOSIS — I7789 Other specified disorders of arteries and arterioles: Secondary | ICD-10-CM

## 2018-05-22 DIAGNOSIS — Q231 Congenital insufficiency of aortic valve: Secondary | ICD-10-CM | POA: Insufficient documentation

## 2018-05-22 DIAGNOSIS — Q2381 Bicuspid aortic valve: Secondary | ICD-10-CM

## 2018-05-22 HISTORY — DX: Bicuspid aortic valve: Q23.81

## 2018-05-22 HISTORY — DX: Congenital insufficiency of aortic valve: Q23.1

## 2018-05-22 HISTORY — DX: Other specified disorders of arteries and arterioles: I77.89

## 2018-05-22 NOTE — Progress Notes (Signed)
Cardiology Office Note:    Date:  05/25/2018   ID:  Eugene Taylor, DOB 02/28/1951, MRN [id number]  PCP:  Olive Bass, MD  Cardiologist:  [name], MD    Referring MD: Olive Bass, MD    ASSESSMENT:    1. Recurrent idiopathic pericarditis   2. Bicuspid aortic valve   3. Essential hypertension   4. Enlarged thoracic aorta (HCC)   5. Other chest pain    PLAN:    In order of problems listed above:  1. He is symptomatically and objectively improved quick normalization of his inflammatory markers and he feels well and has had no further chest pain.  At one month we will reduce his colchicine to daily for 6 months and I will reduce his ibuprofen 800 mg twice daily 1 week recheck his inflammatory markers and immunologic studies for connective tissue disease and if normal reduce him to 400 mg ibuprofen twice daily.  No clinical characteristics of underlying tissue disease.  Back in 6 weeks and plan a repeat echocardiogram about that time 2. Mild aortic stenosis radial initiate endocarditis prophylaxis 3. Stable blood pressure target continue current treatment 4. He agrees to undergo noncontrast CT of the chest to define his aortic dimensions with a bicuspid aortic valve   Next appointment: 6 weeks   Medication Adjustments/Labs and Tests Ordered: Current medicines are reviewed at length with the patient today.  Concerns regarding medicines are outlined above.  Orders Placed This Encounter  Procedures  . CT Chest Wo Contrast  . Sedimentation rate  . CRP High sensitivity  . Antinuclear Antib (ANA)  . Complement, total   Meds ordered this encounter  Medications  . ibuprofen (ADVIL,MOTRIN) 800 MG tablet    Sig: Take 1 tablet (800 mg total) by mouth 2 (two) times daily.    Dispense:  90 tablet    Refill:  0  . DISCONTD: clindamycin (CLEOCIN) 300 MG capsule    Sig: Take 2 capsules (600 mg total) by mouth 3 (three) times daily. Take 2 capsules (600mg ) 45 minutes prior to  dental visits    Dispense:  10 capsule    Refill:  3  . clindamycin (CLEOCIN) 300 MG capsule    Sig: Take 2 capsules (600 mg total) by mouth as directed. Take 2 capsules (600mg ) 45 minutes prior to dental visits    Dispense:  10 capsule    Refill:  3    Chief Complaint  Patient presents with  . Follow-up    pericarditis    History of Present Illness:    Eugene Taylor is a 67 y.o. male with a hx of recurrent pericarditis last seen 05/05/18 and started on NSAID and colchicine.Marland Kitchen His initial episode was inpatient at [facility] in September 2017.   Ref Range & Units 2d ago 2wk ago  Sed Rate 0 - 30 mm/hr 20  44High     Ref Range & Units 2d ago 2wk ago   CRP, High Sensitivity 0.00 - 3.00 mg/L 0.68  10.71High  CM   Echo 05/11/18: - Left ventricle: The cavity size was normal. Wall thickness was   normal. Systolic function was normal. The estimated ejection   fraction was in the range of 60% to 65%. Wall motion was normal;   there were no regional wall motion abnormalities. - Aortic valve: Bicuspid; mildly thickened leaflets. There was mild   stenosis. There was mild regurgitation, with a single jet   directed centrally in the LVOT. Valve  area (VTI): 2.72 cm^2.   Valve area (Vmax): 2.51 cm^2. Valve area (Vmean): 2.2 cm^2. - Ascending aorta: The ascending aorta was moderately dilated.39mm - Pericardium, extracardiac: A small pericardial effusion 9 mm was   identified posterior to the heart.  Compliance with diet, lifestyle and medications: Yes  Within 1 to 2 days his pericardial pain stopped after initiating ibuprofen and colchicine and he now feels well no fever chills shortness of breath chest pain or palpitation.  I requested a copy of the echocardiogram [facility] in 2017 a bicuspid valve and dilated aorta was not appreciated at that time.  Further evaluation repeat his CRP and sedimentation rate and we can do immunologic studies for connective tissue disease and a follow-up CT no  contrast for aortic dimensions Past Medical History:  Diagnosis Date  . Acute idiopathic pericarditis 04/25/2016   Overview:  2017: hosp, EKG changes, colchicine/indomethacin  . Acute medial meniscal tear 12/15/2014  . Arthritis    LEFT SHOULDER  . Asthma   . Benign hypertension 11/27/2015  . External hemorrhoid, thrombosed 12/28/2015   Overview:  2017:   . History of adenomatous polyp of colon   . History of kidney stones   . Hyperlipidemia   . Hypertension   . Lower respiratory infection 02/08/2016  . Mixed hyperlipidemia 11/27/2015  . Obstructive sleep apnea 11/27/2015  . OSA on CPAP   . Right knee meniscal tear   . Screening for diabetes mellitus (DM) 11/27/2015  . Wears glasses     Past Surgical History:  Procedure Laterality Date  . APPENDECTOMY  age 47  . CHONDROPLASTY Right 12/15/2014   Procedure: CHONDROPLASTY;  Surgeon: Ollen Gross, MD;  Location: [facility];  Service: Orthopedics;  Laterality: Right;  . COLONOSCOPY W/ POLYPECTOMY  2011  . CYSTO/  URETEROSCOPIC STONE EXTRACTIONS  1995  . KNEE ARTHROSCOPY WITH LATERAL MENISECTOMY Right 12/15/2014   Procedure: KNEE ARTHROSCOPY WITH LATERAL MENISECTOMY;  Surgeon: Ollen Gross, MD;  Location: [facility];  Service: Orthopedics;  Laterality: Right;  . KNEE ARTHROSCOPY WITH MEDIAL MENISECTOMY Right 12/15/2014   Procedure: KNEE ARTHROSCOPY WITH MEDIAL MENISECTOMY;  Surgeon: Ollen Gross, MD;  Location: [facility];  Service: Orthopedics;  Laterality: Right;  . NASAL SEPTUM SURGERY  1995    Current Medications: Current Meds  Medication Sig  . colchicine 0.6 MG tablet Take 1 tablet (0.6 mg total) by mouth 2 (two) times daily.  Marland Kitchen EPIPEN 2-PAK 0.3 MG/0.3ML SOAJ injection   . ibuprofen (ADVIL,MOTRIN) 800 MG tablet Take 1 tablet (800 mg total) by mouth 2 (two) times daily.  Marland Kitchen telmisartan-hydrochlorothiazide (MICARDIS HCT) 40-12.5 MG tablet Take 1 tablet by mouth daily.  . [DISCONTINUED]  ibuprofen (ADVIL,MOTRIN) 800 MG tablet Take 1 tablet (800 mg total) by mouth 3 (three) times daily.   Current Facility-Administered Medications for the 05/25/18 encounter (Office Visit) with Baldo Daub, MD  Medication  . triamcinolone acetonide (KENALOG-40) injection 20 mg     Allergies:   Penicillins and Hydrocodeine [dihydrocodeine]   Social History   Socioeconomic History  . Marital status: Married    Spouse name: Not on file  . Number of children: Not on file  . Years of education: Not on file  . Highest education level: Not on file  Occupational History  . Not on file  Social Needs  . Financial resource strain: Not on file  . Food insecurity:    Worry: Not on file    Inability: Not on file  .  Transportation needs:    Medical: Not on file    Non-medical: Not on file  Tobacco Use  . Smoking status: Never Smoker  . Smokeless tobacco: Never Used  Substance and Sexual Activity  . Alcohol use: No  . Drug use: No  . Sexual activity: Not on file  Lifestyle  . Physical activity:    Days per week: Not on file    Minutes per session: Not on file  . Stress: Not on file  Relationships  . Social connections:    Talks on phone: Not on file    Gets together: Not on file    Attends religious service: Not on file    Active member of club or organization: Not on file    Attends meetings of clubs or organizations: Not on file    Relationship status: Not on file  Other Topics Concern  . Not on file  Social History Narrative  . Not on file     Family History: The patient's family history includes Pancreatic cancer in his father; Stomach cancer in his mother. ROS:   Please see the history of present illness.    All other systems reviewed and are negative.  EKGs/Labs/Other Studies Reviewed:    The following studies were reviewed today:    Recent Labs: 05/05/2018: Hemoglobin 15.6; Platelets 339  Recent Lipid Panel No results found for: CHOL, TRIG, HDL, CHOLHDL,  VLDL, LDLCALC, LDLDIRECT  Physical Exam:    VS:  BP 130/88 (BP Location: Right Arm, Patient Position: Sitting, Cuff Size: Normal)   Pulse 71   Ht 5\' 8"  (1.727 m)   Wt 212 lb (96.2 kg)   SpO2 96%   BMI 32.23 kg/m     Wt Readings from Last 3 Encounters:  05/25/18 212 lb (96.2 kg)  05/05/18 212 lb 9.6 oz (96.4 kg)  12/24/17 212 lb 12.8 oz (96.5 kg)     GEN:  Well nourished, well developed in no acute distress HEENT: Normal NECK: No JVD; No carotid bruits LYMPHATICS: No lymphadenopathy CARDIAC: no rub or murmur RRR, no murmurs, rubs, gallops RESPIRATORY:  Clear to auscultation without rales, wheezing or rhonchi  ABDOMEN: Soft, non-tender, non-distended MUSCULOSKELETAL:  No edema; No deformity  SKIN: Warm and dry NEUROLOGIC:  Alert and oriented x 3 PSYCHIATRIC:  Normal affect    Signed, [name], MD  05/25/2018 5:02 PM    [facility]

## 2018-05-25 ENCOUNTER — Encounter: Payer: Self-pay | Admitting: Cardiology

## 2018-05-25 ENCOUNTER — Ambulatory Visit: Payer: Medicare Other | Admitting: Cardiology

## 2018-05-25 VITALS — BP 130/88 | HR 71 | Ht 68.0 in | Wt 212.0 lb

## 2018-05-25 DIAGNOSIS — I7789 Other specified disorders of arteries and arterioles: Secondary | ICD-10-CM

## 2018-05-25 DIAGNOSIS — I10 Essential (primary) hypertension: Secondary | ICD-10-CM | POA: Diagnosis not present

## 2018-05-25 DIAGNOSIS — Q231 Congenital insufficiency of aortic valve: Secondary | ICD-10-CM | POA: Diagnosis not present

## 2018-05-25 DIAGNOSIS — R0789 Other chest pain: Secondary | ICD-10-CM

## 2018-05-25 DIAGNOSIS — I300 Acute nonspecific idiopathic pericarditis: Secondary | ICD-10-CM

## 2018-05-25 MED ORDER — IBUPROFEN 800 MG PO TABS: 800.0000 mg | ORAL_TABLET | Freq: Two times a day (BID) | ORAL | 0 refills | Status: DC

## 2018-05-25 MED ORDER — CLINDAMYCIN HCL 300 MG PO CAPS: 600.0000 mg | ORAL_CAPSULE | ORAL | 3 refills | Status: DC

## 2018-05-25 MED ORDER — CLINDAMYCIN HCL 300 MG PO CAPS: 600.0000 mg | ORAL_CAPSULE | Freq: Three times a day (TID) | ORAL | 3 refills | Status: DC

## 2018-05-25 NOTE — Patient Instructions (Addendum)
Medication Instructions:  Your physician has recommended you make the following change in your medication:   Decrease Ibuprofen 800mg  to twice daily Decrease colchicine 0.6 mg one tablet daily on 06-05-18   Labwork: You will have lab work next week.  Testing/Procedures: Non-Cardiac CT scanning, (CAT scanning), is a noninvasive, special x-ray that produces cross-sectional images of the body using x-rays and a computer. CT scans help physicians diagnose and treat medical conditions. For some CT exams, a contrast material is used to enhance visibility in the area of the body being studied. CT scans provide greater clarity and reveal more details than regular x-ray exams.    Follow-Up: Your physician recommends that you schedule a follow-up appointment in: 6 weeks   Any Other Special Instructions Will Be Listed Below (If Applicable).     If you need a refill on your cardiac medications before your next appointment, please call your pharmacy.      Pericarditis Pericarditis is swelling and irritation (inflammation) of your pericardium. The pericardium is a thin, double-layered, fluid-filled sac that surrounds your heart. The pericardium protects and holds your heart in your chest cavity. Inflammation of your pericardium can cause rubbing (friction) between the two layers when your heart beats. Fluid may build up between the layers of the sac (pericardial effusion). Different types of pericarditis include:  Acute pericarditis. Inflammation develops suddenly and causes pericardial effusion.  Chronic pericarditis. Inflammation may develop gradually, or it may continue after acute pericarditis and last longer than 6 months.  Constrictive pericarditis. The layers of the pericardium stiffen and develop scar tissue. The scar tissue thickens and sticks together. This makes it difficult for the heart to pump and to work as it normally does. This type is rare.  In most cases, pericarditis is  acute and not serious. Chronic pericarditis and constrictive pericarditis may be more serious and may require treatment. What are the causes? Often, the cause of pericarditis is not known.If a cause is found, the cause may be:  A viral infection.  A heart attack (myocardial infarction).  Open-heart surgery (coronary artery bypass graft surgery).  Chest injury.  Autoimmune conditions, such as lupus or rheumatoid arthritis.  Kidney failure.  Low-functioning thyroid gland (hypothyroidism).  Cancer from another part of the body that has spread (metastasized) to the pericardium.  Radiation treatment.  Certain medicines, including some seizure medicines, blood thinners, heart medicines, and antibiotics.  A bacterial or fungal infection. This cause is less common.  What increases the risk? The following factors may increase your risk of pericarditis:  Being male.  Being 27-51 years old.  Having had pericarditis before.  Having had a recent upper respiratory tract infection.  What are the signs or symptoms? The most common symptom of pericarditis is chest pain. This pain may:  Be in the center of your chest or the left side of your chest.  Not go away with rest.  Last for many hours or days.  Worsen when you lie down and go away when you sit up and lean forward.  Worsen when you swallow.  Move to your back, neck, or shoulder.  Other symptoms may include:  A chronic, dry cough.  Heart palpitations. These may feel like rapid, fluttering, or pounding heartbeats.  Dizziness or fainting.  Tiredness or fatigue.  Fever.  Rapid breathing.  Shortness of breath when lying down.  How is this diagnosed? This condition is diagnosed with a medical history, physical exam, and diagnostic tests. During your physical exam, your health  care provider will listen for friction while your heart beats (pericardial rub). You may also have tests, including:  Blood work to look  for signs of infection and inflammation.  Electrocardiogram (ECG).  Echocardiogram.  CT scan.  MRI.  Culture of pericardial fluid.  A tissue sample (biopsy) of the pericardium.  If tests show that you may have constrictive pericarditis, you may have a procedure (cardiac catheterization) to confirm this diagnosis. How is this treated? Treatment for this condition depends on the cause and type of pericarditis. In most cases, acute pericarditis will clear up on its own within 10 days. Treatment for other types of pericarditis may include:  Medicines, such as: ? NSAIDs for pain and inflammation. ? Steroids to reduce inflammation. ? Colchicine to relieve pain and inflammation.  A procedure to remove fluid using a needle (pericardiocentesis) if pericardial effusion puts pressure on the heart.  Surgery to remove part of the pericardium if constrictive pericarditis develops.  If another condition is causing your pericarditis, you may need treatment for that underlying condition. Follow these instructions at home:  Do not use tobacco products, including cigarettes, chewing tobacco, or e-cigarettes. If you need help quitting, ask your health care provider.  Maintain a healthy weight.  Follow an exercise program as told by your health care provider. You may need to limit your exercise until your symptoms go away.  Eat a heart-healthy diet. A registered dietitian can help you to learn about healthy food choices.  Take over-the-counter and prescription medicines only as told by your health care provider. Keep a list of all of your medicines with you at all times. For each medicine, include information about the name, the dosage, how often you take it, and how you take it.  Keep all follow-up visits as told by your health care provider. This is important. Contact a health care provider if:  You continue to have symptoms of pericarditis.  You develop new symptoms of  pericarditis.  Your symptoms get worse. Get help right away if:  You have worsening chest pain and difficulty breathing. These symptoms may represent a serious problem that is an emergency. Do not wait to see if the symptoms will go away. Get medical help right away. Call your local emergency services (911 in the [place] Do not drive yourself to the hospital. This information is not intended to replace advice given to you by your health care provider. Make sure you discuss any questions you have with your health care provider. Document Released: 02/05/2001 Document Revised: 01/15/2016 Document Reviewed: 02/22/2015 Elsevier Interactive Patient Education  [employer].

## 2018-06-05 ENCOUNTER — Other Ambulatory Visit: Payer: Self-pay

## 2018-06-05 MED ORDER — COLCHICINE 0.6 MG PO TABS: 0.6000 mg | ORAL_TABLET | Freq: Two times a day (BID) | ORAL | 0 refills | Status: DC

## 2018-06-11 ENCOUNTER — Telehealth: Payer: Self-pay

## 2018-06-11 NOTE — Telephone Encounter (Signed)
Patient notified of stable CT Chest results per Dr Dulce Sellar.  Patient verbalized understanding.

## 2018-06-16 ENCOUNTER — Other Ambulatory Visit: Payer: Medicare Other

## 2018-07-04 LAB — ANA: Anti Nuclear Antibody(ANA): NEGATIVE

## 2018-07-04 LAB — COMPLEMENT, TOTAL: Compl, Total (CH50): >60 U/mL (ref 42–999999)

## 2018-07-04 LAB — SEDIMENTATION RATE: Sed Rate: 9 mm/hr (ref 0–30)

## 2018-07-04 LAB — HIGH SENSITIVITY CRP: CRP, High Sensitivity: 0.82 mg/L (ref 0.00–3.00)

## 2018-07-07 ENCOUNTER — Encounter: Payer: Self-pay | Admitting: Cardiology

## 2018-07-07 ENCOUNTER — Ambulatory Visit: Payer: Medicare Other | Admitting: Cardiology

## 2018-07-07 VITALS — BP 128/80 | HR 74 | Ht 71.0 in | Wt 211.6 lb

## 2018-07-07 DIAGNOSIS — I350 Nonrheumatic aortic (valve) stenosis: Secondary | ICD-10-CM

## 2018-07-07 DIAGNOSIS — I7789 Other specified disorders of arteries and arterioles: Secondary | ICD-10-CM

## 2018-07-07 DIAGNOSIS — I351 Nonrheumatic aortic (valve) insufficiency: Secondary | ICD-10-CM

## 2018-07-07 DIAGNOSIS — I300 Acute nonspecific idiopathic pericarditis: Secondary | ICD-10-CM

## 2018-07-07 DIAGNOSIS — I10 Essential (primary) hypertension: Secondary | ICD-10-CM | POA: Diagnosis not present

## 2018-07-07 DIAGNOSIS — Q231 Congenital insufficiency of aortic valve: Secondary | ICD-10-CM

## 2018-07-07 HISTORY — DX: Nonrheumatic aortic (valve) insufficiency: I35.1

## 2018-07-07 HISTORY — DX: Nonrheumatic aortic (valve) stenosis: I35.0

## 2018-07-07 MED ORDER — IBUPROFEN 400 MG PO TABS: 400.0000 mg | ORAL_TABLET | Freq: Two times a day (BID) | ORAL | 3 refills | Status: DC

## 2018-07-07 NOTE — Patient Instructions (Signed)
Medication Instructions:  Your physician has recommended you make the following change in your medication:   DECREASE ibuprofen (advil) 400 mg: Take 1 tablet twice daily   If you need a refill on your cardiac medications before your next appointment, please call your pharmacy.   Lab work: Your physician recommends that you return for lab work today: CBC, sedimentation rate, CRP. These labs will be repeated once a month for the next 2 months. Please return to our office for lab work, no appointment needed. No need to fast beforehand.   If you have labs (blood work) drawn today and your tests are completely normal, you will receive your results only by: Marland Kitchen MyChart Message (if you have MyChart) OR . A paper copy in the mail If you have any lab test that is abnormal or we need to change your treatment, we will call you to review the results.  Testing/Procedures: None  Follow-Up: At [facility], you and your health needs are our priority.  As part of our continuing mission to provide you with exceptional heart care, we have created designated Provider Care Teams.  These Care Teams include your primary Cardiologist (physician) and Advanced Practice Providers (APPs -  Physician Assistants and Nurse Practitioners) who all work together to provide you with the care you need, when you need it. You will need a follow up appointment in 3 months.

## 2018-07-07 NOTE — Progress Notes (Signed)
Cardiology Office Note:    Date:  07/07/2018   ID:  Eugene Taylor, DOB 07-07-1951, MRN [id number]  PCP:  Olive Bass, MD  Cardiologist:  [name], MD    Referring MD: Olive Bass, MD    ASSESSMENT:    1. Recurrent idiopathic pericarditis   2. Essential hypertension   3. Aortic stenosis, mild   4. Nonrheumatic aortic valve insufficiency   5. Bicuspid aortic valve   6. Enlarged thoracic aorta (HCC)    PLAN:    In order of problems listed above:  1. Improved quickly responded continue nonsteroidal anti-inflammatory drug dose decreased continue colchicine check CBC for safety every month along with CRP and sedimentation rate to see him back in the office in 3 months. 2. Stable blood pressure target continue current treatment 3. Stable recheck echocardiogram 1 year follow endocarditis prophylaxis 4. See #3 5. See #3 6. Recheck CT scan in 1 year   Next appointment: 3 months   Medication Adjustments/Labs and Tests Ordered: Current medicines are reviewed at length with the patient today.  Concerns regarding medicines are outlined above.  No orders of the defined types were placed in this encounter.  No orders of the defined types were placed in this encounter.   Chief Complaint  Patient presents with  . Follow-up    for pericarditis    History of Present Illness:    Eugene Taylor is a 67 y.o. male with a hx of recurrent pericarditis initiated on colchicine and nonsteroidal anti-inflammatory drugs 05/05/2018.  At that time both his sedimentation rate and CRP were markedly elevated echocardiogram showed a small posterior pericardial effusion as well as a bicuspid aortic valve with mild stenosis mild regurgitation mild to moderate dilatation of the ascending aorta at 39 mm.  He was last seen 05/25/2018.  Signs and symptoms of pericarditis quickly resolved both sedimentation rate and CRP normalized and screening studies for rheumatologic disorders were normal  including ANA and complement levels. Compliance with diet, lifestyle and medications: Yes  His pericardial pain quickly resolved tolerates nonsteroidal anti-inflammatory drug and colchicine.  Initiated colchicine 05/06/2018 we will plan to take it for 6 months daily.  He will reduce his dose of ibuprofen by 50% I will plan monthly to check a CBC sed rate and CRP.  For his bicuspid valve and mild aortic valve disease and dilatation of his aorta you need follow-up echocardiogram CT in 1 year and will follow endocarditis prophylaxis   Ref Range & Units 5d ago 84mo ago 96mo ago  Sed Rate 0 - 30 mm/hr 9  20  44High      Ref Range & Units 5d ago 84mo ago 96mo ago  CRP, High Sensitivity 0.00 - 3.00 mg/L 0.82  0.68 CM 10.71High  CM     Past Medical History:  Diagnosis Date  . Acute idiopathic pericarditis 04/25/2016   Overview:  2017: hosp, EKG changes, colchicine/indomethacin  . Acute medial meniscal tear 12/15/2014  . Arthritis    LEFT SHOULDER  . Asthma   . Benign hypertension 11/27/2015  . External hemorrhoid, thrombosed 12/28/2015   Overview:  2017:   . History of adenomatous polyp of colon   . History of kidney stones   . Hyperlipidemia   . Hypertension   . Lower respiratory infection 02/08/2016  . Mixed hyperlipidemia 11/27/2015  . Obstructive sleep apnea 11/27/2015  . OSA on CPAP   . Right knee meniscal tear   . Screening for diabetes mellitus (DM)  11/27/2015  . Wears glasses     Past Surgical History:  Procedure Laterality Date  . APPENDECTOMY  age 67  . CHONDROPLASTY Right 12/15/2014   Procedure: CHONDROPLASTY;  Surgeon: Ollen GrossFrank Aluisio, MD;  [facility] [place];  Service: Orthopedics;  Laterality: Right;  . COLONOSCOPY W/ POLYPECTOMY  2011  . CYSTO/  URETEROSCOPIC STONE EXTRACTIONS  1995  . KNEE ARTHROSCOPY WITH LATERAL MENISECTOMY Right 12/15/2014   Procedure: KNEE ARTHROSCOPY WITH LATERAL MENISECTOMY;  Surgeon: Ollen GrossFrank Aluisio, MD;  [facility] [place];   Service: Orthopedics;  Laterality: Right;  . KNEE ARTHROSCOPY WITH MEDIAL MENISECTOMY Right 12/15/2014   Procedure: KNEE ARTHROSCOPY WITH MEDIAL MENISECTOMY;  Surgeon: Ollen GrossFrank Aluisio, MD;  [facility] [place];  Service: Orthopedics;  Laterality: Right;  . NASAL SEPTUM SURGERY  1995    Current Medications: Current Meds  Medication Sig  . clindamycin (CLEOCIN) 300 MG capsule Take 2 capsules (600 mg total) by mouth as directed. Take 2 capsules (600mg ) 45 minutes prior to dental visits  . colchicine 0.6 MG tablet Take 1 tablet (0.6 mg total) by mouth 2 (two) times daily. (Patient taking differently: Take 0.6 mg by mouth daily. )  . EPIPEN 2-PAK 0.3 MG/0.3ML SOAJ injection   . ibuprofen (ADVIL,MOTRIN) 800 MG tablet Take 1 tablet (800 mg total) by mouth 2 (two) times daily.  Marland Kitchen. telmisartan-hydrochlorothiazide (MICARDIS HCT) 40-12.5 MG tablet Take 1 tablet by mouth daily.   Current Facility-Administered Medications for the 07/07/18 encounter (Office Visit) with Baldo DaubMunley,  J, MD  Medication  . triamcinolone acetonide (KENALOG-40) injection 20 mg     Allergies:   Penicillins and Hydrocodeine [dihydrocodeine]   Social History   Socioeconomic History  . Marital status: Married    Spouse name: Not on file  . Number of children: Not on file  . Years of education: Not on file  . Highest education level: Not on file  Occupational History  . Not on file  Social Needs  . Financial resource strain: Not on file  . Food insecurity:    Worry: Not on file    Inability: Not on file  . Transportation needs:    Medical: Not on file    Non-medical: Not on file  Tobacco Use  . Smoking status: Never Smoker  . Smokeless tobacco: Never Used  Substance and Sexual Activity  . Alcohol use: No  . Drug use: No  . Sexual activity: Not on file  Lifestyle  . Physical activity:    Days per week: Not on file    Minutes per session: Not on file  . Stress: Not on file  Relationships  .  Social connections:    Talks on phone: Not on file    Gets together: Not on file    Attends religious service: Not on file    Active member of club or organization: Not on file    Attends meetings of clubs or organizations: Not on file    Relationship status: Not on file  Other Topics Concern  . Not on file  Social History Narrative  . Not on file     Family History: The patient's family history includes Pancreatic cancer in his father; Stomach cancer in his mother. ROS:   Please see the history of present illness.    All other systems reviewed and are negative.  EKGs/Labs/Other Studies Reviewed:    The following studies were reviewed today:   Recent Labs: 05/05/2018: Hemoglobin 15.6; Platelets 339  Recent Lipid  Panel No results found for: CHOL, TRIG, HDL, CHOLHDL, VLDL, LDLCALC, LDLDIRECT  Physical Exam:    VS:  BP 128/80 (BP Location: Right Arm, Patient Position: Sitting, Cuff Size: Normal)   Pulse 74   Ht 5\' 11"  (1.803 m)   Wt 211 lb 9.6 oz (96 kg)   SpO2 96%   BMI 29.51 kg/m     Wt Readings from Last 3 Encounters:  07/07/18 211 lb 9.6 oz (96 kg)  05/25/18 212 lb (96.2 kg)  05/05/18 212 lb 9.6 oz (96.4 kg)     GEN:  Well nourished, well developed in no acute distress HEENT: Normal NECK: No JVD; No carotid bruits LYMPHATICS: No lymphadenopathy CARDIAC: RRR, no murmurs, rubs, gallops RESPIRATORY:  Clear to auscultation without rales, wheezing or rhonchi  ABDOMEN: Soft, non-tender, non-distended MUSCULOSKELETAL:  No edema; No deformity  SKIN: Warm and dry NEUROLOGIC:  Alert and oriented x 3 PSYCHIATRIC:  Normal affect    Signed, [name], MD  07/07/2018 4:18 PM    [facility]

## 2018-08-03 ENCOUNTER — Telehealth: Payer: Self-pay | Admitting: *Deleted

## 2018-08-03 MED ORDER — COLCHICINE 0.6 MG PO TABS: 0.6000 mg | ORAL_TABLET | Freq: Two times a day (BID) | ORAL | 0 refills | Status: DC

## 2018-08-03 NOTE — Telephone Encounter (Signed)
*  STAT* If patient is at the pharmacy, call can be transferred to refill team.   1. Which medications need to be refilled? (please list name of each medication and dose if known) Cholchicine 0.6 mg bid  2. Which pharmacy/location (including street and city if local pharmacy) is medication to be sent to?[facility]  3. Do they need a 30 day or 90 day supply? 30

## 2018-08-07 LAB — SEDIMENTATION RATE: Sed Rate: 11 mm/hr (ref 0–30)

## 2018-08-07 LAB — CBC
Hematocrit: 44.1 % (ref 37.5–51.0)
Hemoglobin: 15.5 g/dL (ref 13.0–17.7)
MCH: 30.6 pg (ref 26.6–33.0)
MCHC: 35.1 g/dL (ref 31.5–35.7)
MCV: 87 fL (ref 79–97)
Platelets: 280 10*3/uL (ref 150–450)
RBC: 5.07 x10E6/uL (ref 4.14–5.80)
RDW: 14.4 % (ref 12.3–15.4)
WBC: 5.3 10*3/uL (ref 3.4–10.8)

## 2018-08-07 LAB — HIGH SENSITIVITY CRP: CRP, High Sensitivity: 0.5 mg/L (ref 0.00–3.00)

## 2018-09-01 ENCOUNTER — Telehealth: Payer: Self-pay

## 2018-09-01 MED ORDER — TELMISARTAN-HCTZ 40-12.5 MG PO TABS: 1.0000 | ORAL_TABLET | Freq: Every day | ORAL | 2 refills | Status: DC

## 2018-09-01 NOTE — Telephone Encounter (Signed)
Rx for telmisartan-hctz sent to pharmacy.

## 2018-10-05 ENCOUNTER — Telehealth: Payer: Self-pay | Admitting: Emergency Medicine

## 2018-10-05 DIAGNOSIS — I300 Acute nonspecific idiopathic pericarditis: Secondary | ICD-10-CM

## 2018-10-05 NOTE — Telephone Encounter (Signed)
Patient in for lab work. Ordered Cbc, sedimentation rate, and c reactive protein lab per Dr. Hulen Shouts last office visit.

## 2018-10-06 LAB — C-REACTIVE PROTEIN: CRP: <1 mg/L (ref 0–10)

## 2018-10-06 LAB — CBC
Hematocrit: 46.9 % (ref 37.5–51.0)
Hemoglobin: 16.1 g/dL (ref 13.0–17.7)
MCH: 31 pg (ref 26.6–33.0)
MCHC: 34.3 g/dL (ref 31.5–35.7)
MCV: 90 fL (ref 79–97)
Platelets: 234 10*3/uL (ref 150–450)
RBC: 5.2 x10E6/uL (ref 4.14–5.80)
RDW: 13.6 % (ref 11.6–15.4)
WBC: 5.3 10*3/uL (ref 3.4–10.8)

## 2018-10-06 LAB — SEDIMENTATION RATE: Sed Rate: 12 mm/hr (ref 0–30)

## 2018-10-09 ENCOUNTER — Other Ambulatory Visit: Payer: Self-pay

## 2018-10-09 MED ORDER — COLCHICINE 0.6 MG PO TABS: 0.6000 mg | ORAL_TABLET | Freq: Two times a day (BID) | ORAL | 3 refills | Status: DC

## 2018-10-10 NOTE — Progress Notes (Addendum)
Cardiology Office Note:    Date:  10/12/2018   ID:  Eugene Taylor, DOB February 28, 1951, MRN [id number]  PCP:  Olive Bass, MD  Cardiologist:  [name], MD    Referring MD: Olive Bass, MD    ASSESSMENT:    1. Recurrent idiopathic pericarditis   2. Bicuspid aortic valve   3. Enlarged thoracic aorta (HCC)    PLAN:     His cardiac CTA shows severe proximal LAD bifurcation diagonal stenosis with abnormal FFR.  He is symptomatic.  Clinically I consider this to be high risk anatomy and advised him that we should undergo coronary angiography and do it this week prior to the emergence of Covidien 19 admissions and critical care in this region.  Options benefits and risks detailed informed consent obtained.  We are told tentatively has procedure scheduled for Wednesday.  He has no dye allergy and no contraindication to dual antiplatelet therapy.  Dictated 11/09/2018   In order of problems listed above:  1. Stable we will plan to discontinue colchicine at the end of 6 months and before stopping his ibuprofen we will recheck his inflammatory markers one last time. 2. Stable he has mild stenosis mild regurgitation 3. Recheck CT scan 4. For exertional chest pain cardiac CTA   Next appointment: 3 months   Medication Adjustments/Labs and Tests Ordered: Current medicines are reviewed at length with the patient today.  Concerns regarding medicines are outlined above.  No orders of the defined types were placed in this encounter.  No orders of the defined types were placed in this encounter.   Chief Complaint  Patient presents with  . Follow-up    for pericarditis    History of Present Illness:    Eugene Taylor is a 68 y.o. male with a hx of recurrent idiopathic pericarditis a bicuspid aortic valve with mild stenosis mild regurgitation and mild enlargement of the a sending thoracic aorta 40 mm October 2019 as well as hypertension last seen 07/07/2018 and was continued on  treatment with his nonsteroidal anti-inflammatory drug and colchicine..  Recent C-reactive protein is less than 1 sedimentation rate is 12.  He was placed on colchicine at the time of recurrence 05/06/2018 with a projected course of 6 months. Compliance with diet, lifestyle and medications: Yes  He has had no fever pleuritic discomfort continues on ibuprofen and after discussion renew his colchicine for additional 30 days decrease his ibuprofen 50% and follow-up sed rate and CRP in 1 month prior to discontinuing anti-inflammatory treatment.  He has a bicuspid aortic valve he has aortic dilation and is having some vague chest discomfort with physical effort he has had previous normal stress echo after discussion of techniques he will undergo cardiac CTA to define CAD reimage his thoracic aorta and provide an understanding whether he has ischemic CAD also his chest discomfort is predictable exertional substernal no shortness of breath no radiation or diaphoresis mild to moderate but is raise concerns on his part when he exercises Past Medical History:  Diagnosis Date  . Acute idiopathic pericarditis 04/25/2016   Overview:  2017: hosp, EKG changes, colchicine/indomethacin  . Acute medial meniscal tear 12/15/2014  . Arthritis    LEFT SHOULDER  . Asthma   . Benign hypertension 11/27/2015  . External hemorrhoid, thrombosed 12/28/2015   Overview:  2017:   . History of adenomatous polyp of colon   . History of kidney stones   . Hyperlipidemia   . Hypertension   . Lower  respiratory infection 02/08/2016  . Mixed hyperlipidemia 11/27/2015  . Obstructive sleep apnea 11/27/2015  . OSA on CPAP   . Right knee meniscal tear   . Screening for diabetes mellitus (DM) 11/27/2015  . Wears glasses     Past Surgical History:  Procedure Laterality Date  . APPENDECTOMY  age 68  . CHONDROPLASTY Right 12/15/2014   Procedure: CHONDROPLASTY;  Surgeon: Ollen GrossFrank Aluisio, MD;  [facility] [place];  Service:  Orthopedics;  Laterality: Right;  . COLONOSCOPY W/ POLYPECTOMY  2011  . CYSTO/  URETEROSCOPIC STONE EXTRACTIONS  1995  . KNEE ARTHROSCOPY WITH LATERAL MENISECTOMY Right 12/15/2014   Procedure: KNEE ARTHROSCOPY WITH LATERAL MENISECTOMY;  Surgeon: Ollen GrossFrank Aluisio, MD;  [facility] [place];  Service: Orthopedics;  Laterality: Right;  . KNEE ARTHROSCOPY WITH MEDIAL MENISECTOMY Right 12/15/2014   Procedure: KNEE ARTHROSCOPY WITH MEDIAL MENISECTOMY;  Surgeon: Ollen GrossFrank Aluisio, MD;  [facility] [place];  Service: Orthopedics;  Laterality: Right;  . NASAL SEPTUM SURGERY  1995    Current Medications: Current Meds  Medication Sig  . atorvastatin (LIPITOR) 40 MG tablet Take 40 mg by mouth daily.  . clindamycin (CLEOCIN) 300 MG capsule Take 2 capsules (600 mg total) by mouth as directed. Take 2 capsules (600mg ) 45 minutes prior to dental visits  . colchicine 0.6 MG tablet Take 1 tablet (0.6 mg total) by mouth 2 (two) times daily.  Marland Kitchen. EPIPEN 2-PAK 0.3 MG/0.3ML SOAJ injection   . ibuprofen (ADVIL,MOTRIN) 400 MG tablet Take 1 tablet (400 mg total) by mouth 2 (two) times daily.  Marland Kitchen. telmisartan-hydrochlorothiazide (MICARDIS HCT) 40-12.5 MG tablet Take 1 tablet by mouth daily.   Current Facility-Administered Medications for the 10/12/18 encounter (Office Visit) with Baldo DaubMunley, Chrisann Melaragno J, MD  Medication  . triamcinolone acetonide (KENALOG-40) injection 20 mg     Allergies:   Penicillins and Hydrocodeine [dihydrocodeine]   Social History   Socioeconomic History  . Marital status: Married    Spouse name: Not on file  . Number of children: Not on file  . Years of education: Not on file  . Highest education level: Not on file  Occupational History  . Not on file  Social Needs  . Financial resource strain: Not on file  . Food insecurity:    Worry: Not on file    Inability: Not on file  . Transportation needs:    Medical: Not on file    Non-medical: Not on file  Tobacco Use  .  Smoking status: Never Smoker  . Smokeless tobacco: Never Used  Substance and Sexual Activity  . Alcohol use: No  . Drug use: No  . Sexual activity: Not on file  Lifestyle  . Physical activity:    Days per week: Not on file    Minutes per session: Not on file  . Stress: Not on file  Relationships  . Social connections:    Talks on phone: Not on file    Gets together: Not on file    Attends religious service: Not on file    Active member of club or organization: Not on file    Attends meetings of clubs or organizations: Not on file    Relationship status: Not on file  Other Topics Concern  . Not on file  Social History Narrative  . Not on file     Family History: The patient's family history includes Pancreatic cancer in his father; Stomach cancer in his mother. ROS:   Please see the history of  present illness.    All other systems reviewed and are negative.  EKGs/Labs/Other Studies Reviewed:    The following studies were reviewed today:    Recent Labs: 10/05/2018: Hemoglobin 16.1; Platelets 234  Recent Lipid Panel No results found for: CHOL, TRIG, HDL, CHOLHDL, VLDL, LDLCALC, LDLDIRECT  Physical Exam:    VS:  BP 112/78 (BP Location: Right Arm, Patient Position: Sitting, Cuff Size: Normal)   Pulse 72   Ht 5\' 11"  (1.803 m)   Wt 208 lb 3.2 oz (94.4 kg)   SpO2 98%   BMI 29.04 kg/m     Wt Readings from Last 3 Encounters:  10/12/18 208 lb 3.2 oz (94.4 kg)  07/07/18 211 lb 9.6 oz (96 kg)  05/25/18 212 lb (96.2 kg)     GEN:  Well nourished, well developed in no acute distress HEENT: Normal NECK: No JVD; No carotid bruits LYMPHATICS: No lymphadenopathy CARDIAC: RRR, no murmurs, rubs, gallops RESPIRATORY:  Clear to auscultation without rales, wheezing or rhonchi  ABDOMEN: Soft, non-tender, non-distended MUSCULOSKELETAL:  No edema; No deformity  SKIN: Warm and dry NEUROLOGIC:  Alert and oriented x 3 PSYCHIATRIC:  Normal affect    Signed, [name], MD    10/12/2018 11:20 AM    [facility]

## 2018-10-12 ENCOUNTER — Encounter: Payer: Self-pay | Admitting: Cardiology

## 2018-10-12 ENCOUNTER — Ambulatory Visit: Payer: Medicare Other | Admitting: Cardiology

## 2018-10-12 VITALS — BP 112/78 | HR 72 | Ht 71.0 in | Wt 208.2 lb

## 2018-10-12 DIAGNOSIS — R079 Chest pain, unspecified: Secondary | ICD-10-CM

## 2018-10-12 DIAGNOSIS — Q231 Congenital insufficiency of aortic valve: Secondary | ICD-10-CM

## 2018-10-12 DIAGNOSIS — I7789 Other specified disorders of arteries and arterioles: Secondary | ICD-10-CM

## 2018-10-12 DIAGNOSIS — I25118 Atherosclerotic heart disease of native coronary artery with other forms of angina pectoris: Secondary | ICD-10-CM

## 2018-10-12 DIAGNOSIS — I319 Disease of pericardium, unspecified: Secondary | ICD-10-CM

## 2018-10-12 DIAGNOSIS — Z01812 Encounter for preprocedural laboratory examination: Secondary | ICD-10-CM

## 2018-10-12 DIAGNOSIS — I300 Acute nonspecific idiopathic pericarditis: Secondary | ICD-10-CM

## 2018-10-12 MED ORDER — METOPROLOL TARTRATE 50 MG PO TABS: 50.0000 mg | ORAL_TABLET | Freq: Once | ORAL | 0 refills | Status: DC

## 2018-10-12 MED ORDER — IBUPROFEN 400 MG PO TABS: 400.0000 mg | ORAL_TABLET | Freq: Every day | ORAL | 3 refills | Status: DC

## 2018-10-12 NOTE — Patient Instructions (Signed)
Medication Instructions:  Your physician has recommended you make the following change in your medication:   STOP atorvastatin  STOP colchicine   DECREASE ibuprofen 400 mg: Take 1 tablet daily   If you need a refill on your cardiac medications before your next appointment, please call your pharmacy.   Lab work: Your physician recommends that you return for lab work within 3-7 days before your cardiac CTA: BMP. You will return to our office for lab work, no appointment needed. No need to fast beforehand.   Your physician recommends that you return for lab work in 1 month: sedimentation rate, C-reactive protein. You will return to our office for lab work, no appointment needed. No need to fast beforehand.   If you have labs (blood work) drawn today and your tests are completely normal, you will receive your results only by: Marland Kitchen MyChart Message (if you have MyChart) OR . A paper copy in the mail If you have any lab test that is abnormal or we need to change your treatment, we will call you to review the results.  Testing/Procedures: Your physician has requested that you have cardiac CT. Cardiac computed tomography (CT) is a painless test that uses an x-ray machine to take clear, detailed pictures of your heart. For further information please visit [email or web address] Please follow instruction sheet as given.   Please arrive at the [facility] main entrance of [facility] at xx:xx AM (30-45 minutes prior to test start time)  [facility] [address] [place], [place] [address] [phone]  Proceed to the [facility] Radiology Department (First Floor).  Please follow these instructions carefully (unless otherwise directed):  Hold all erectile dysfunction medications at least 48 hours prior to test.  On the Night Before the Test: . Be sure to Drink plenty of water. . Do not consume any caffeinated/decaffeinated beverages or chocolate 12 hours prior to your  test. . Do not take any antihistamines 12 hours prior to your test.  On the Day of the Test: . Drink plenty of water. Do not drink any water within one hour of the test. . Do not eat any food 4 hours prior to the test. . You may take your regular medications prior to the test.  . Take metoprolol (Lopressor) two hours prior to test. . HOLD telmisartan-hydrochlorothiazide morning of the test until testing is completed.                   -If HR is less than 55 BPM- No Beta Blocker                -IF HR is greater than 55 BPM and patient is less than or equal to 24 yrs old Lopressor 100mg  x1.        After the Test: . Drink plenty of water. . After receiving IV contrast, you may experience a mild flushed feeling. This is normal. . On occasion, you may experience a mild rash up to 24 hours after the test. This is not dangerous. If this occurs, you can take Benadryl 25 mg and increase your fluid intake. . If you experience trouble breathing, this can be serious. If it is severe call 911 IMMEDIATELY. If it is mild, please call our office.   Follow-Up: At [facility], you and your health needs are our priority.  As part of our continuing mission to provide you with exceptional heart care, we have created designated Provider Care Teams.  These Care  Teams include your primary Cardiologist (physician) and Advanced Practice Providers (APPs -  Physician Assistants and Nurse Practitioners) who all work together to provide you with the care you need, when you need it. You will need a follow up appointment in 3 months.      Cardiac CT Angiogram  A cardiac CT angiogram is a procedure to look at the heart and the area around the heart. It may be done to help find the cause of chest pains or other symptoms of heart disease. During this procedure, a large X-ray machine, called a CT scanner, takes detailed pictures of the heart and the surrounding area after a dye (contrast material) has been injected  into blood vessels in the area. The procedure is also sometimes called a coronary CT angiogram, coronary artery scanning, or CTA. A cardiac CT angiogram allows the health care provider to see how well blood is flowing to and from the heart. The health care provider will be able to see if there are any problems, such as:  Blockage or narrowing of the coronary arteries in the heart.  Fluid around the heart.  Signs of weakness or disease in the muscles, valves, and tissues of the heart. Tell a health care provider about:  Any allergies you have. This is especially important if you have had a previous allergic reaction to contrast dye.  All medicines you are taking, including vitamins, herbs, eye drops, creams, and over-the-counter medicines.  Any blood disorders you have.  Any surgeries you have had.  Any medical conditions you have.  Whether you are pregnant or may be pregnant.  Any anxiety disorders, chronic pain, or other conditions you have that may increase your stress or prevent you from lying still. What are the risks? Generally, this is a safe procedure. However, problems may occur, including:  Bleeding.  Infection.  Allergic reactions to medicines or dyes.  Damage to other structures or organs.  Kidney damage from the dye or contrast that is used.  Increased risk of cancer from radiation exposure. This risk is low. Talk with your health care provider about: ? The risks and benefits of testing. ? How you can receive the lowest dose of radiation. What happens before the procedure?  Wear comfortable clothing and remove any jewelry, glasses, dentures, and hearing aids.  Follow instructions from your health care provider about eating and drinking. This may include: ? For 12 hours before the test - avoid caffeine. This includes tea, coffee, soda, energy drinks, and diet pills. Drink plenty of water or other fluids that do not have caffeine in them. Being well-hydrated can  prevent complications. ? For 4-6 hours before the test - stop eating and drinking. The contrast dye can cause nausea, but this is less likely if your stomach is empty.  Ask your health care provider about changing or stopping your regular medicines. This is especially important if you are taking diabetes medicines, blood thinners, or medicines to treat erectile dysfunction. What happens during the procedure?  Hair on your chest may need to be removed so that small sticky patches called electrodes can be placed on your chest. These will transmit information that helps to monitor your heart during the test.  An IV tube will be inserted into one of your veins.  You might be given a medicine to control your heart rate during the test. This will help to ensure that good images are obtained.  You will be asked to lie on an exam table. This  table will slide in and out of the CT machine during the procedure.  Contrast dye will be injected into the IV tube. You might feel warm, or you may get a metallic taste in your mouth.  You will be given a medicine (nitroglycerin) to relax (dilate) the arteries in your heart.  The table that you are lying on will move into the CT machine tunnel for the scan.  The person running the machine will give you instructions while the scans are being done. You may be asked to: ? Keep your arms above your head. ? Hold your breath. ? Stay very still, even if the table is moving.  When the scanning is complete, you will be moved out of the machine.  The IV tube will be removed. The procedure may vary among health care providers and hospitals. What happens after the procedure?  You might feel warm, or you may get a metallic taste in your mouth from the contrast dye.  You may have a headache from the nitroglycerin.  After the procedure, drink water or other fluids to wash (flush) the contrast material out of your body.  Contact a health care provider if you have  any symptoms of allergy to the contrast. These symptoms include: ? Shortness of breath. ? Rash or hives. ? A racing heartbeat.  Most people can return to their normal activities right after the procedure. Ask your health care provider what activities are safe for you.  It is up to you to get the results of your procedure. Ask your health care provider, or the department that is doing the procedure, when your results will be ready. Summary  A cardiac CT angiogram is a procedure to look at the heart and the area around the heart. It may be done to help find the cause of chest pains or other symptoms of heart disease.  During this procedure, a large X-ray machine, called a CT scanner, takes detailed pictures of the heart and the surrounding area after a dye (contrast material) has been injected into blood vessels in the area.  Ask your health care provider about changing or stopping your regular medicines before the procedure. This is especially important if you are taking diabetes medicines, blood thinners, or medicines to treat erectile dysfunction.  After the procedure, drink water or other fluids to wash (flush) the contrast material out of your body. This information is not intended to replace advice given to you by your health care provider. Make sure you discuss any questions you have with your health care provider. Document Released: 07/25/2008 Document Revised: 07/01/2016 Document Reviewed: 07/01/2016 Elsevier Interactive Patient Education  2019 [employer]

## 2018-10-12 NOTE — Addendum Note (Signed)
Addended by: Crist Fat on: 10/12/2018 11:38 AM   Modules accepted: Orders

## 2018-10-13 ENCOUNTER — Telehealth: Payer: Self-pay | Admitting: *Deleted

## 2018-10-13 NOTE — Telephone Encounter (Signed)
Atorvastatin was stopped during appointment yesterday. Did you want to start patient on a different cholesterol medication?

## 2018-10-13 NOTE — Telephone Encounter (Signed)
Pt called because he understood yesterday that Dr. Dulce Sellar was going to try a different cholesterol medication since he stopped the Atorvastatin but nothing new was sent in. Please advise.

## 2018-10-13 NOTE — Telephone Encounter (Signed)
Please tell him he is correct discontinue Lipitor or generic atorvastatin and start rosuvastatin generic 10 mg #30 or 90 refill 5 or 1

## 2018-10-14 MED ORDER — ROSUVASTATIN CALCIUM 10 MG PO TABS: 10.0000 mg | ORAL_TABLET | Freq: Every day | ORAL | 3 refills | Status: DC

## 2018-10-14 NOTE — Telephone Encounter (Signed)
Patient informed that Dr. Dulce Sellar recommends him to start rosuvastatin 10 mg daily to replace atorvastatin that was discontinued. Patient verbalized understanding and is agreeable. Prescription has been sent to [facility] as requested. No further questions.

## 2018-10-14 NOTE — Addendum Note (Signed)
Addended by: Crist Fat on: 10/14/2018 08:46 AM   Modules accepted: Orders

## 2018-11-03 LAB — BASIC METABOLIC PANEL
BUN/Creatinine Ratio: 23 (ref 10–24)
BUN: 26 mg/dL (ref 8–27)
CO2: 19 mmol/L — ABNORMAL LOW (ref 20–29)
Calcium: 9.7 mg/dL (ref 8.6–10.2)
Chloride: 98 mmol/L (ref 96–106)
Creatinine, Ser: 1.15 mg/dL (ref 0.76–1.27)
GFR calc Af Amer: 77 mL/min/{1.73_m2} (ref >59)
GFR calc non Af Amer: 66 mL/min/{1.73_m2} (ref >59)
Glucose: 162 mg/dL — ABNORMAL HIGH (ref 65–99)
Potassium: 4.2 mmol/L (ref 3.5–5.2)
Sodium: 137 mmol/L (ref 134–144)

## 2018-11-04 ENCOUNTER — Telehealth (HOSPITAL_COMMUNITY): Payer: Self-pay | Admitting: Emergency Medicine

## 2018-11-04 NOTE — Telephone Encounter (Signed)
Reaching out to patient to offer assistance regarding upcoming cardiac imaging study; pt verbalizes understanding of appt date/time, parking situation and where to check in, pre-test NPO status and medications ordered, and verified current allergies; name and call back number provided for further questions should they arise Kenroy Timberman RN Navigator Cardiac Imaging [facility] [phone] office [phone] cell 

## 2018-11-06 ENCOUNTER — Ambulatory Visit (HOSPITAL_COMMUNITY)
Admission: RE | Admit: 2018-11-06 | Discharge: 2018-11-06 | Disposition: A | Discharge status: Home / Self Care | Payer: Medicare Other | Source: Ambulatory Visit | Attending: Cardiology | Admitting: Cardiology

## 2018-11-06 ENCOUNTER — Other Ambulatory Visit: Payer: Self-pay

## 2018-11-06 DIAGNOSIS — R079 Chest pain, unspecified: Secondary | ICD-10-CM

## 2018-11-06 DIAGNOSIS — I2510 Atherosclerotic heart disease of native coronary artery without angina pectoris: Secondary | ICD-10-CM | POA: Diagnosis not present

## 2018-11-06 DIAGNOSIS — I700 Atherosclerosis of aorta: Secondary | ICD-10-CM | POA: Insufficient documentation

## 2018-11-06 MED ORDER — NITROGLYCERIN 0.4 MG SL SUBL
0.8000 mg | SUBLINGUAL_TABLET | Freq: Once | SUBLINGUAL | Status: AC
  Administered 2018-11-06: 0.8 mg via SUBLINGUAL

## 2018-11-06 MED ORDER — IOHEXOL 350 MG/ML SOLN
80.0000 mL | Freq: Once | INTRAVENOUS | Status: AC | PRN
  Administered 2018-11-06: 80 mL via INTRAVENOUS

## 2018-11-06 MED ORDER — NITROGLYCERIN 0.4 MG SL SUBL
SUBLINGUAL_TABLET | SUBLINGUAL | Status: AC
  Filled 2018-11-06: qty 2

## 2018-11-06 NOTE — Progress Notes (Signed)
Ct complete. Patient denies any complaints. Offered patient snack and beverage.  

## 2018-11-09 ENCOUNTER — Ambulatory Visit (INDEPENDENT_AMBULATORY_CARE_PROVIDER_SITE_OTHER): Payer: Medicare Other | Admitting: Cardiology

## 2018-11-09 ENCOUNTER — Other Ambulatory Visit: Payer: Self-pay

## 2018-11-09 ENCOUNTER — Encounter: Payer: Self-pay | Admitting: Cardiology

## 2018-11-09 VITALS — BP 124/86 | HR 63 | Ht 71.0 in | Wt 204.2 lb

## 2018-11-09 DIAGNOSIS — Z01818 Encounter for other preprocedural examination: Secondary | ICD-10-CM | POA: Diagnosis not present

## 2018-11-09 DIAGNOSIS — I25118 Atherosclerotic heart disease of native coronary artery with other forms of angina pectoris: Secondary | ICD-10-CM

## 2018-11-09 HISTORY — DX: Atherosclerotic heart disease of native coronary artery with other forms of angina pectoris: I25.118

## 2018-11-09 MED ORDER — ASPIRIN EC 81 MG PO TBEC: 81.0000 mg | DELAYED_RELEASE_TABLET | Freq: Every day | ORAL | 3 refills | Status: DC

## 2018-11-09 NOTE — Patient Instructions (Addendum)
Medication Instructions:  Your physician has recommended you make the following change in your medication:   START aspirin 81 mg: Take 1 tablet daily   If you need a refill on your cardiac medications before your next appointment, please call your pharmacy.   Lab work: Your physician recommends that you return for lab work today: CBC, BMP.   If you have labs (blood work) drawn today and your tests are completely normal, you will receive your results only by: Marland Kitchen MyChart Message (if you have MyChart) OR . A paper copy in the mail If you have any lab test that is abnormal or we need to change your treatment, we will call you to review the results.  Testing/Procedures: You had an EKG today.      [facility] CARDIOVASCULAR DIVISION [facility] AT [place] [address] [place] [address] Dept: [phone] Loc: [phone]  KARVER FADDEN  11/09/2018  You are scheduled for a Cardiac Catheterization on Wednesday, March 18 with Dr. Nicki Guadalajara.  1. Please arrive at the [facility] (Main Entrance A) at [facility]: [address] [place], [place] [address] at 6:30 AM (This time is two hours before your procedure to ensure your preparation). Free valet parking service is available.   Special note: Every effort is made to have your procedure done on time. Please understand that emergencies sometimes delay scheduled procedures.  2. Diet: Do not eat solid foods after midnight.  The patient may have clear liquids until 5am upon the day of the procedure.  3. Labs: None needed.  4. Medication instructions in preparation for your procedure:   Contrast Allergy: No  STOP taking telmisartan-hydrochlorothiazide the morning of the heart catheterization until after procedure is completed.   On the morning of your procedure, take your Aspirin and any morning medicines NOT listed above.  You may use sips of water.  5. Plan for one night stay--bring  personal belongings. 6. Bring a current list of your medications and current insurance cards. 7. You MUST have a responsible person to drive you home. 8. Someone MUST be with you the first 24 hours after you arrive home or your discharge will be delayed. 9. Please wear clothes that are easy to get on and off and wear slip-on shoes.  Thank you for allowing [place] to care for you!   -- [facility]   Follow-Up: At [facility], you and your health needs are our priority.  As part of our continuing mission to provide you with exceptional heart care, we have created designated Provider Care Teams.  These Care Teams include your primary Cardiologist (physician) and Advanced Practice Providers (APPs -  Physician Assistants and Nurse Practitioners) who all work together to provide you with the care you need, when you need it. You will need a follow up appointment in 1 months.        Aspirin and Your Heart  Aspirin is a medicine that prevents the cells in the blood that are used for clotting, called platelets, from sticking together. Aspirin can be used to help reduce the risk of blood clots, heart attacks, and other heart-related problems. Can I take aspirin? Your health care provider will help you determine whether it is safe and beneficial for you to take aspirin daily. Taking aspirin daily may be helpful if you:  Have had a heart attack or chest pain.  Are at risk for a heart attack.  Have undergone  open-heart surgery, such as coronary artery bypass surgery (CABG).  Have had coronary angioplasty or a stent.  Have had certain types of stroke or transient ischemic attack (TIA).  Have peripheral artery disease (PAD).  Have chronic heart rhythm problems such as atrial fibrillation and cannot take an anticoagulant.  Have valve disease or have had surgery on a valve. What are the risks? Daily use of aspirin can cause side effects. Some of these include:   Bleeding. Bleeding problems can be minor or serious. An example of a minor problem is a cut that does not stop bleeding. An example of a more serious problem is stomach bleeding or, rarely, bleeding into the brain. Your risk of bleeding is increased if you are also taking non-steroidal anti-inflammatory drugs (NSAIDs).  Increased bruising.  Upset stomach.  An allergic reaction. People who have nasal polyps have an increased risk of developing an aspirin allergy. General guidelines  Take aspirin only as told by your health care provider. Make sure that you understand how much you should take and what form you should take. The two forms of aspirin are: ? Non-enteric-coated.This type of aspirin does not have a coating and is absorbed quickly. This type of aspirin also comes in a chewable form. ? Enteric-coated. This type of aspirin has a coating that releases the medicine very slowly. Enteric-coated aspirin might cause less stomach upset than non-enteric-coated aspirin. This type of aspirin should not be chewed or crushed.  Limit alcohol intake to no more than 1 drink a day for nonpregnant women and 2 drinks a day for men. Drinking alcohol increases your risk of bleeding. One drink equals 12 oz of beer, 5 oz of wine, or 1 oz of hard liquor. Contact a health care provider if you:  Have unusual bleeding or bruising.  Have stomach pain or nausea.  Have ringing in your ears.  Have an allergic reaction that causes: ? Hives. ? Itchy skin. ? Swelling of the lips, tongue, or face. Get help right away if you:  Notice that your bowel movements are bloody, dark red, or black in color.  Vomit or cough up blood.  Have blood in your urine.  Cough, have noisy breathing (wheeze), or feel short of breath.  Have chest pain, especially if the pain spreads to the arms, back, neck, or jaw.  Have a severe headache, or a headache with confusion, or dizziness. These symptoms may represent a serious  problem that is an emergency. Do not wait to see if the symptoms will go away. Get medical help right away. Call your local emergency services (911 in the [place] Do not drive yourself to the hospital. Summary  Aspirin can be used to help reduce the risk of blood clots, heart attacks, and other heart-related problems.  Daily use of aspirin can increase your risk of side effects. Your health care provider will help you determine whether it is safe and beneficial for you to take aspirin daily.  Take aspirin only as told by your health care provider. Make sure that you understand how much you can take and what form you can take. This information is not intended to replace advice given to you by your health care provider. Make sure you discuss any questions you have with your health care provider. Document Released: 07/25/2008 Document Revised: 06/12/2017 Document Reviewed: 06/12/2017 Elsevier Interactive Patient Education  2019 [employer]vier Inc.     Coronary Angiogram With Stent Coronary angiogram with stent placement is a procedure to widen or  open a narrow blood vessel of the heart (coronary artery). Arteries may become blocked by cholesterol buildup (plaques) in the lining of the wall. When a coronary artery becomes partially blocked, blood flow to that area decreases. This may lead to chest pain or a heart attack (myocardial infarction). A stent is a small piece of metal that looks like mesh or a spring. Stent placement may be done as treatment for a heart attack or right after a coronary angiogram in which a blocked artery is found. Let your health care provider know about:  Any allergies you have.  All medicines you are taking, including vitamins, herbs, eye drops, creams, and over-the-counter medicines.  Any problems you or family members have had with anesthetic medicines.  Any blood disorders you have.  Any surgeries you have had.  Any medical conditions you have.  Whether you are  pregnant or may be pregnant. What are the risks? Generally, this is a safe procedure. However, problems may occur, including:  Damage to the heart or its blood vessels.  A return of blockage.  Bleeding, infection, or bruising at the insertion site.  A collection of blood under the skin (hematoma) at the insertion site.  A blood clot in another part of the body.  Kidney injury.  Allergic reaction to the dye or contrast that is used.  Bleeding into the abdomen (retroperitoneal bleeding). What happens before the procedure? Staying hydrated Follow instructions from your health care provider about hydration, which may include:  Up to 2 hours before the procedure - you may continue to drink clear liquids, such as water, clear fruit juice, black coffee, and plain tea.  Eating and drinking restrictions Follow instructions from your health care provider about eating and drinking, which may include:  8 hours before the procedure - stop eating heavy meals or foods such as meat, fried foods, or fatty foods.  6 hours before the procedure - stop eating light meals or foods, such as toast or cereal.  2 hours before the procedure - stop drinking clear liquids. Ask your health care provider about:  Changing or stopping your regular medicines. This is especially important if you are taking diabetes medicines or blood thinners.  Taking medicines such as ibuprofen. These medicines can thin your blood. Do not take these medicines before your procedure if your health care provider instructs you not to. Generally, aspirin is recommended before a procedure of passing a small, thin tube (catheter) through a blood vessel and into the heart (cardiac catheterization). What happens during the procedure?   An IV tube will be inserted into one of your veins.  You will be given one or more of the following: ? A medicine to help you relax (sedative). ? A medicine to numb the area where the catheter will  be inserted into an artery (local anesthetic).  To reduce your risk of infection: ? Your health care team will wash or sanitize their hands. ? Your skin will be washed with soap. ? Hair may be removed from the area where the catheter will be inserted.  Using a guide wire, the catheter will be inserted into an artery. The location may be in your groin, in your wrist, or in the fold of your arm (near your elbow).  A type of X-ray (fluoroscopy) will be used to help guide the catheter to the opening of the arteries in the heart.  A dye will be injected into the catheter, and X-rays will be taken. The dye will  help to show where any narrowing or blockages are located in the arteries.  A tiny wire will be guided to the blocked spot, and a balloon will be inflated to make the artery wider.  The stent will be expanded and will crush the plaques into the wall of the vessel. The stent will hold the area open and improve the blood flow. Most stents have a drug coating to reduce the risk of the stent narrowing over time.  The artery may be made wider using a drill, laser, or other tools to remove plaques.  When the blood flow is better, the catheter will be removed. The lining of the artery will grow over the stent, which stays where it was placed. This procedure may vary among health care providers and hospitals. What happens after the procedure?  If the procedure is done through the leg, you will be kept in bed lying flat for about 6 hours. You will be instructed to not bend and not cross your legs.  The insertion site will be checked frequently.  The pulse in your foot or wrist will be checked frequently.  You may have additional blood tests, X-rays, and a test that records the electrical activity of your heart (electrocardiogram, or ECG). This information is not intended to replace advice given to you by your health care provider. Make sure you discuss any questions you have with your health  care provider. Document Released: 02/16/2003 Document Revised: 11/21/2017 Document Reviewed: 03/17/2016 Elsevier Interactive Patient Education  2019 [employer]

## 2018-11-09 NOTE — Addendum Note (Signed)
Addended byNorman Herrlich on: 11/09/2018 10:55 AM   Modules accepted: Orders

## 2018-11-09 NOTE — Addendum Note (Signed)
Addended by: [name] on: 11/09/2018 08:57 AM   Modules accepted: Orders, SmartSet

## 2018-11-09 NOTE — Progress Notes (Signed)
Cardiology Office Note:    Date:  11/09/2018   ID:  [name], DOB [birth year], MRN [id number]  PCP:  Olive Bass, MD  Cardiologist:  [name], MD    Referring MD: Olive Bass, MD    ASSESSMENT:    1. Coronary artery disease of native artery of native heart with stable angina pectoris (HCC)    PLAN:    In order of problems listed above:  1. With high risk coronary anatomy he will undergo coronary angiography and likely revascularization.  He is nondiabetic and hopefully can be done percutaneously.  He has stable exertional angina but has severe flow-limiting stenosis in his proximal LAD diagonal and his proximal left circumflex.  His pericarditis is stable he remains on colchicine no recurrence since pericardium is normal on CT scan he has a bicuspid aortic valve with mild stenosis.  His other problems include hyperlipidemia and hypertension that are stable he has no dye allergy no contraindication to dual antiplatelet therapy   Next appointment: 1 month   Medication Adjustments/Labs and Tests Ordered: Current medicines are reviewed at length with the patient today.  Concerns regarding medicines are outlined above.  No orders of the defined types were placed in this encounter.  No orders of the defined types were placed in this encounter.   No chief complaint on file.   History of Present Illness:    Eugene Taylor is a 68 y.o. male with a hx of recurrent idiopathic pericarditis a bicuspid aortic valve with mild stenosis mild regurgitation and mild enlargement of the a sending thoracic aorta 40 mm October 2019 as well as hypertension last seen 10/12/17 and was continued on treatment with his nonsteroidal anti-inflammatory drug and colchicine  He subsequent a cardiac CTA performed with a high CT calcium score severe proximal LAD disease involving the first diagonal and proximal mid left circumflex.  His FFR returned yesterday and shows flow-limiting stenosis in  both vessels.  I contacted the patient's today we reviewed his high risk anatomy and after discussion of options benefits and risk he will undergo coronary angiography tentatively scheduled for Wednesday prior to the emergence of hospitalizations for coronavirus in our environment.  His pericardium was normal and he did not have enlargement of his aorta. Compliance with diet, lifestyle and medications: Yes  Last night I received his FFR called him this morning reviewed his coronary anatomy high risk and advised him to undergo angiography prior to the onset of coronavirus hospitalizations and pneumonia in our community.  Risk benefits and options detailed patient and wife agree and will be set up to be performed on 11/11/2018.  He is a nondiabetic and hopefully is a candidate for percutaneous revascularization.  His medical regimen consist of antihypertensives high intensity statin will ask him to start take an aspirin 81 mg daily Past Medical History:  Diagnosis Date  . Acute idiopathic pericarditis 04/25/2016   Overview:  2017: hosp, EKG changes, colchicine/indomethacin  . Acute medial meniscal tear 12/15/2014  . Arthritis    LEFT SHOULDER  . Asthma   . Benign hypertension 11/27/2015  . External hemorrhoid, thrombosed 12/28/2015   Overview:  2017:   . History of adenomatous polyp of colon   . History of kidney stones   . Hyperlipidemia   . Hypertension   . Lower respiratory infection 02/08/2016  . Mixed hyperlipidemia 11/27/2015  . Obstructive sleep apnea 11/27/2015  . OSA on CPAP   . Right knee meniscal tear   .  Screening for diabetes mellitus (DM) 11/27/2015  . Wears glasses     Past Surgical History:  Procedure Laterality Date  . APPENDECTOMY  age 11  . CHONDROPLASTY Right 12/15/2014   Procedure: CHONDROPLASTY;  Surgeon: Ollen Gross, MD;  Location: [facility];  Service: Orthopedics;  Laterality: Right;  . COLONOSCOPY W/ POLYPECTOMY  2011  . CYSTO/  URETEROSCOPIC STONE  EXTRACTIONS  1995  . KNEE ARTHROSCOPY WITH LATERAL MENISECTOMY Right 12/15/2014   Procedure: KNEE ARTHROSCOPY WITH LATERAL MENISECTOMY;  Surgeon: Ollen Gross, MD;  Location: [facility];  Service: Orthopedics;  Laterality: Right;  . KNEE ARTHROSCOPY WITH MEDIAL MENISECTOMY Right 12/15/2014   Procedure: KNEE ARTHROSCOPY WITH MEDIAL MENISECTOMY;  Surgeon: Ollen Gross, MD;  Location: [facility];  Service: Orthopedics;  Laterality: Right;  . NASAL SEPTUM SURGERY  1995    Current Medications: Current Meds  Medication Sig  . clindamycin (CLEOCIN) 300 MG capsule Take 2 capsules (600 mg total) by mouth as directed. Take 2 capsules (600mg ) 45 minutes prior to dental visits  . colchicine 0.6 MG tablet Take 0.6 mg by mouth daily.  Marland Kitchen EPIPEN 2-PAK 0.3 MG/0.3ML SOAJ injection   . ibuprofen (ADVIL,MOTRIN) 200 MG tablet Take 200 mg by mouth daily.  . rosuvastatin (CRESTOR) 10 MG tablet Take 1 tablet (10 mg total) by mouth daily.  Marland Kitchen telmisartan-hydrochlorothiazide (MICARDIS HCT) 40-12.5 MG tablet Take 1 tablet by mouth daily.   Current Facility-Administered Medications for the 11/09/18 encounter (Office Visit) with Baldo Daub, MD  Medication  . triamcinolone acetonide (KENALOG-40) injection 20 mg     Allergies:   Penicillins and Hydrocodeine [dihydrocodeine]   Social History   Socioeconomic History  . Marital status: Married    Spouse name: Not on file  . Number of children: Not on file  . Years of education: Not on file  . Highest education level: Not on file  Occupational History  . Not on file  Social Needs  . Financial resource strain: Not on file  . Food insecurity:    Worry: Not on file    Inability: Not on file  . Transportation needs:    Medical: Not on file    Non-medical: Not on file  Tobacco Use  . Smoking status: Never Smoker  . Smokeless tobacco: Never Used  Substance and Sexual Activity  . Alcohol use: No  . Drug use: No  . Sexual  activity: Not on file  Lifestyle  . Physical activity:    Days per week: Not on file    Minutes per session: Not on file  . Stress: Not on file  Relationships  . Social connections:    Talks on phone: Not on file    Gets together: Not on file    Attends religious service: Not on file    Active member of club or organization: Not on file    Attends meetings of clubs or organizations: Not on file    Relationship status: Not on file  Other Topics Concern  . Not on file  Social History Narrative  . Not on file     Family History: The patient's family history includes Pancreatic cancer in his father; Stomach cancer in his mother. ROS:   Please see the history of present illness.    All other systems reviewed and are negative.  EKGs/Labs/Other Studies Reviewed:    The following studies were reviewed today:  EKG:  EKG ordered today and personally reviewed.  The ekg ordered  today demonstrates sinus rhythm normal  Recent Labs: 10/05/2018: Hemoglobin 16.1; Platelets 234 11/02/2018: BUN 26; Creatinine, Ser 1.15; Potassium 4.2; Sodium 137  Recent Lipid Panel No results found for: CHOL, TRIG, HDL, CHOLHDL, VLDL, LDLCALC, LDLDIRECT  Physical Exam:    VS:  BP 124/86 (BP Location: Right Arm, Patient Position: Sitting, Cuff Size: Normal)   Pulse 63   Ht 5\' 11"  (1.803 m)   Wt 204 lb 3.2 oz (92.6 kg)   SpO2 98%   BMI 28.48 kg/m     Wt Readings from Last 3 Encounters:  11/09/18 204 lb 3.2 oz (92.6 kg)  10/12/18 208 lb 3.2 oz (94.4 kg)  07/07/18 211 lb 9.6 oz (96 kg)     GEN:  Well nourished, well developed in no acute distress HEENT: Normal NECK: No JVD; No carotid bruits LYMPHATICS: No lymphadenopathy CARDIAC: RRR, no murmurs, rubs, gallops RESPIRATORY:  Clear to auscultation without rales, wheezing or rhonchi  ABDOMEN: Soft, non-tender, non-distended MUSCULOSKELETAL:  No edema; No deformity  SKIN: Warm and dry NEUROLOGIC:  Alert and oriented x 3 PSYCHIATRIC:  Normal affect     Signed, Norman HerrlichBrian Munley, MD  11/09/2018 10:47 AM    [facility]

## 2018-11-10 ENCOUNTER — Telehealth: Payer: Self-pay | Admitting: *Deleted

## 2018-11-10 LAB — CBC
Hematocrit: 48.8 % (ref 37.5–51.0)
Hemoglobin: 17 g/dL (ref 13.0–17.7)
MCH: 31.1 pg (ref 26.6–33.0)
MCHC: 34.8 g/dL (ref 31.5–35.7)
MCV: 89 fL (ref 79–97)
Platelets: 266 x10E3/uL (ref 150–450)
RBC: 5.47 x10E6/uL (ref 4.14–5.80)
RDW: 13.2 % (ref 11.6–15.4)
WBC: 7.9 x10E3/uL (ref 3.4–10.8)

## 2018-11-10 LAB — BASIC METABOLIC PANEL WITH GFR
BUN/Creatinine Ratio: 20 (ref 10–24)
BUN: 23 mg/dL (ref 8–27)
CO2: 22 mmol/L (ref 20–29)
Calcium: 9.5 mg/dL (ref 8.6–10.2)
Chloride: 100 mmol/L (ref 96–106)
Creatinine, Ser: 1.15 mg/dL (ref 0.76–1.27)
GFR calc Af Amer: 77 mL/min/1.73 (ref >59)
GFR calc non Af Amer: 66 mL/min/1.73 (ref >59)
Glucose: 81 mg/dL (ref 65–99)
Potassium: 4.1 mmol/L (ref 3.5–5.2)
Sodium: 139 mmol/L (ref 134–144)

## 2018-11-10 LAB — SEDIMENTATION RATE: Sed Rate: 14 mm/hr (ref 0–30)

## 2018-11-10 LAB — C-REACTIVE PROTEIN: CRP: <1 mg/L (ref 0–10)

## 2018-11-10 NOTE — Telephone Encounter (Signed)
Pt contacted pre-catheterization scheduled at [facility] for: Wednesday November 11, 2018 8:30 AM Verified arrival time and place: [facility] Main Entrance A at: 6:30 AM  No solid food after midnight prior to cath, clear liquids until 5 AM day of procedure. Contrast allergy: no  Hold: Telmisartan-HCT-PM prior to procedure.  AM meds can be  taken pre-cath with sip of water including: ASA 81 mg  Confirmed patient has responsible person to drive home post procedure and observe 24 hours after arriving home: yes

## 2018-11-11 ENCOUNTER — Other Ambulatory Visit: Payer: Self-pay

## 2018-11-11 ENCOUNTER — Ambulatory Visit (HOSPITAL_COMMUNITY)
Admission: RE | Admit: 2018-11-11 | Discharge: 2018-11-12 | Disposition: A | Discharge status: Home / Self Care | Payer: Medicare Other | Attending: Cardiovascular Disease | Admitting: Cardiovascular Disease

## 2018-11-11 ENCOUNTER — Encounter (HOSPITAL_COMMUNITY): Payer: Self-pay | Admitting: General Practice

## 2018-11-11 ENCOUNTER — Encounter (HOSPITAL_COMMUNITY)
Admission: RE | Disposition: A | Discharge status: Home / Self Care | Payer: Self-pay | Source: Home / Self Care | Attending: Cardiovascular Disease

## 2018-11-11 DIAGNOSIS — Z7901 Long term (current) use of anticoagulants: Secondary | ICD-10-CM | POA: Diagnosis not present

## 2018-11-11 DIAGNOSIS — I10 Essential (primary) hypertension: Secondary | ICD-10-CM | POA: Diagnosis not present

## 2018-11-11 DIAGNOSIS — I2510 Atherosclerotic heart disease of native coronary artery without angina pectoris: Secondary | ICD-10-CM | POA: Diagnosis present

## 2018-11-11 DIAGNOSIS — R931 Abnormal findings on diagnostic imaging of heart and coronary circulation: Secondary | ICD-10-CM

## 2018-11-11 DIAGNOSIS — Z791 Long term (current) use of non-steroidal anti-inflammatories (NSAID): Secondary | ICD-10-CM | POA: Insufficient documentation

## 2018-11-11 DIAGNOSIS — Z7982 Long term (current) use of aspirin: Secondary | ICD-10-CM | POA: Diagnosis not present

## 2018-11-11 DIAGNOSIS — Z79899 Other long term (current) drug therapy: Secondary | ICD-10-CM | POA: Diagnosis not present

## 2018-11-11 DIAGNOSIS — Z880 Allergy status to penicillin: Secondary | ICD-10-CM | POA: Insufficient documentation

## 2018-11-11 DIAGNOSIS — E785 Hyperlipidemia, unspecified: Secondary | ICD-10-CM | POA: Diagnosis not present

## 2018-11-11 DIAGNOSIS — I300 Acute nonspecific idiopathic pericarditis: Secondary | ICD-10-CM | POA: Diagnosis not present

## 2018-11-11 DIAGNOSIS — Z955 Presence of coronary angioplasty implant and graft: Secondary | ICD-10-CM | POA: Insufficient documentation

## 2018-11-11 DIAGNOSIS — I25118 Atherosclerotic heart disease of native coronary artery with other forms of angina pectoris: Secondary | ICD-10-CM

## 2018-11-11 DIAGNOSIS — Q231 Congenital insufficiency of aortic valve: Secondary | ICD-10-CM | POA: Diagnosis not present

## 2018-11-11 DIAGNOSIS — I209 Angina pectoris, unspecified: Secondary | ICD-10-CM

## 2018-11-11 DIAGNOSIS — G4733 Obstructive sleep apnea (adult) (pediatric): Secondary | ICD-10-CM | POA: Diagnosis not present

## 2018-11-11 HISTORY — DX: Other complications of anesthesia, initial encounter: T88.59XA

## 2018-11-11 HISTORY — PX: CORONARY STENT INTERVENTION: CATH118234

## 2018-11-11 HISTORY — PX: LEFT HEART CATH AND CORONARY ANGIOGRAPHY: CATH118249

## 2018-11-11 HISTORY — DX: Atherosclerotic heart disease of native coronary artery without angina pectoris: I25.10

## 2018-11-11 HISTORY — DX: Adverse effect of unspecified anesthetic, initial encounter: T41.45XA

## 2018-11-11 HISTORY — PX: CARDIAC CATHETERIZATION: SHX172

## 2018-11-11 LAB — CBC
HCT: 45.7 % (ref 39.0–52.0)
Hemoglobin: 15.1 g/dL (ref 13.0–17.0)
MCH: 29.8 pg (ref 26.0–34.0)
MCHC: 33 g/dL (ref 30.0–36.0)
MCV: 90.1 fL (ref 80.0–100.0)
Platelets: 214 10*3/uL (ref 150–400)
RBC: 5.07 MIL/uL (ref 4.22–5.81)
RDW: 13.2 % (ref 11.5–15.5)
WBC: 9 10*3/uL (ref 4.0–10.5)
nRBC: 0 % (ref 0.0–0.2)

## 2018-11-11 LAB — CREATININE, SERUM
Creatinine, Ser: 1.12 mg/dL (ref 0.61–1.24)
GFR calc Af Amer: >60 mL/min (ref >60)
GFR calc non Af Amer: >60 mL/min (ref >60)

## 2018-11-11 LAB — POCT ACTIVATED CLOTTING TIME: Activated Clotting Time: 406 seconds

## 2018-11-11 SURGERY — LEFT HEART CATH AND CORONARY ANGIOGRAPHY
Anesthesia: LOCAL

## 2018-11-11 MED ORDER — HEPARIN (PORCINE) IN NACL 1000-0.9 UT/500ML-% IV SOLN
INTRAVENOUS | Status: AC
  Filled 2018-11-11: qty 500

## 2018-11-11 MED ORDER — FENTANYL CITRATE (PF) 100 MCG/2ML IJ SOLN
INTRAMUSCULAR | Status: AC
  Filled 2018-11-11: qty 2

## 2018-11-11 MED ORDER — HEPARIN SODIUM (PORCINE) 5000 UNIT/ML IJ SOLN
5000.0000 [IU] | Freq: Three times a day (TID) | INTRAMUSCULAR | Status: DC
  Administered 2018-11-12: 5000 [IU] via SUBCUTANEOUS
  Filled 2018-11-11: qty 1

## 2018-11-11 MED ORDER — SODIUM CHLORIDE 0.9 % WEIGHT BASED INFUSION: 3.0000 mL/kg/h | INTRAVENOUS | Status: DC

## 2018-11-11 MED ORDER — SODIUM CHLORIDE 0.9% FLUSH: 3.0000 mL | Freq: Two times a day (BID) | INTRAVENOUS | Status: DC

## 2018-11-11 MED ORDER — TICAGRELOR 90 MG PO TABS
ORAL_TABLET | ORAL | Status: AC
  Filled 2018-11-11: qty 1

## 2018-11-11 MED ORDER — LABETALOL HCL 5 MG/ML IV SOLN: 10.0000 mg | INTRAVENOUS | Status: AC | PRN

## 2018-11-11 MED ORDER — HEPARIN (PORCINE) IN NACL 1000-0.9 UT/500ML-% IV SOLN
INTRAVENOUS | Status: DC | PRN
  Administered 2018-11-11 (×3): 500 mL

## 2018-11-11 MED ORDER — SODIUM CHLORIDE 0.9 % IV SOLN: INTRAVENOUS | Status: AC

## 2018-11-11 MED ORDER — LIDOCAINE HCL (PF) 1 % IJ SOLN
INTRAMUSCULAR | Status: AC
  Filled 2018-11-11: qty 30

## 2018-11-11 MED ORDER — HEPARIN SODIUM (PORCINE) 1000 UNIT/ML IJ SOLN
INTRAMUSCULAR | Status: AC
  Filled 2018-11-11: qty 1

## 2018-11-11 MED ORDER — ROSUVASTATIN CALCIUM 20 MG PO TABS
40.0000 mg | ORAL_TABLET | Freq: Every day | ORAL | Status: DC
  Administered 2018-11-11 – 2018-11-12 (×2): 40 mg via ORAL
  Filled 2018-11-11 (×2): qty 2

## 2018-11-11 MED ORDER — SODIUM CHLORIDE 0.9% FLUSH: 3.0000 mL | INTRAVENOUS | Status: DC | PRN

## 2018-11-11 MED ORDER — ROSUVASTATIN CALCIUM 20 MG PO TABS: 40.0000 mg | ORAL_TABLET | Freq: Every day | ORAL | Status: DC

## 2018-11-11 MED ORDER — ASPIRIN 81 MG PO CHEW: 81.0000 mg | CHEWABLE_TABLET | ORAL | Status: DC

## 2018-11-11 MED ORDER — ASPIRIN EC 81 MG PO TBEC
81.0000 mg | DELAYED_RELEASE_TABLET | Freq: Every day | ORAL | Status: DC
  Administered 2018-11-12: 08:00:00 81 mg via ORAL
  Filled 2018-11-11: qty 1

## 2018-11-11 MED ORDER — HEPARIN SODIUM (PORCINE) 1000 UNIT/ML IJ SOLN
INTRAMUSCULAR | Status: DC | PRN
  Administered 2018-11-11: 4500 [IU] via INTRAVENOUS
  Administered 2018-11-11: 7000 [IU] via INTRAVENOUS

## 2018-11-11 MED ORDER — IOHEXOL 350 MG/ML SOLN
INTRAVENOUS | Status: DC | PRN
  Administered 2018-11-11: 200 mL via INTRAVENOUS

## 2018-11-11 MED ORDER — SODIUM CHLORIDE 0.9 % WEIGHT BASED INFUSION: 1.0000 mL/kg/h | INTRAVENOUS | Status: DC

## 2018-11-11 MED ORDER — MIDAZOLAM HCL 2 MG/2ML IJ SOLN
INTRAMUSCULAR | Status: AC
  Filled 2018-11-11: qty 2

## 2018-11-11 MED ORDER — MIDAZOLAM HCL 2 MG/2ML IJ SOLN
INTRAMUSCULAR | Status: DC | PRN
  Administered 2018-11-11: 1 mg via INTRAVENOUS
  Administered 2018-11-11: 2 mg via INTRAVENOUS

## 2018-11-11 MED ORDER — METOPROLOL TARTRATE 12.5 MG HALF TABLET
12.5000 mg | ORAL_TABLET | Freq: Two times a day (BID) | ORAL | Status: DC
  Administered 2018-11-11 – 2018-11-12 (×2): 12.5 mg via ORAL
  Filled 2018-11-11 (×3): qty 1

## 2018-11-11 MED ORDER — COLCHICINE 0.6 MG PO TABS
0.6000 mg | ORAL_TABLET | Freq: Every day | ORAL | Status: DC
  Administered 2018-11-11 – 2018-11-12 (×2): 0.6 mg via ORAL
  Filled 2018-11-11 (×2): qty 1

## 2018-11-11 MED ORDER — ACETAMINOPHEN 325 MG PO TABS: 650.0000 mg | ORAL_TABLET | ORAL | Status: DC | PRN

## 2018-11-11 MED ORDER — ANGIOPLASTY BOOK
Freq: Once | Status: AC
  Administered 2018-11-11: 23:00:00

## 2018-11-11 MED ORDER — SODIUM CHLORIDE 0.9 % IV SOLN
250.0000 mL | INTRAVENOUS | Status: DC | PRN
  Administered 2018-11-11: 1000 mL via INTRAVENOUS

## 2018-11-11 MED ORDER — AMLODIPINE BESYLATE 5 MG PO TABS
5.0000 mg | ORAL_TABLET | Freq: Every day | ORAL | Status: DC
  Administered 2018-11-11 – 2018-11-12 (×2): 5 mg via ORAL
  Filled 2018-11-11 (×2): qty 1

## 2018-11-11 MED ORDER — TICAGRELOR 90 MG PO TABS
ORAL_TABLET | ORAL | Status: DC | PRN
  Administered 2018-11-11: 180 mg via ORAL

## 2018-11-11 MED ORDER — ASPIRIN 81 MG PO CHEW: 81.0000 mg | CHEWABLE_TABLET | Freq: Every day | ORAL | Status: DC

## 2018-11-11 MED ORDER — VERAPAMIL HCL 2.5 MG/ML IV SOLN
INTRAVENOUS | Status: AC
  Filled 2018-11-11: qty 2

## 2018-11-11 MED ORDER — DIAZEPAM 5 MG PO TABS: 5.0000 mg | ORAL_TABLET | Freq: Four times a day (QID) | ORAL | Status: DC | PRN

## 2018-11-11 MED ORDER — NITROGLYCERIN 1 MG/10 ML FOR IR/CATH LAB
INTRA_ARTERIAL | Status: AC
  Filled 2018-11-11: qty 10

## 2018-11-11 MED ORDER — HYDRALAZINE HCL 20 MG/ML IJ SOLN: 5.0000 mg | INTRAMUSCULAR | Status: AC | PRN

## 2018-11-11 MED ORDER — NITROGLYCERIN 1 MG/10 ML FOR IR/CATH LAB
INTRA_ARTERIAL | Status: DC | PRN
  Administered 2018-11-11 (×2): 100 ug via INTRACORONARY
  Administered 2018-11-11 (×2): 200 ug via INTRACORONARY

## 2018-11-11 MED ORDER — LIDOCAINE HCL (PF) 1 % IJ SOLN
INTRAMUSCULAR | Status: DC | PRN
  Administered 2018-11-11: 2 mL

## 2018-11-11 MED ORDER — FENTANYL CITRATE (PF) 100 MCG/2ML IJ SOLN
INTRAMUSCULAR | Status: DC | PRN
  Administered 2018-11-11: 25 ug via INTRAVENOUS
  Administered 2018-11-11: 50 ug via INTRAVENOUS

## 2018-11-11 MED ORDER — TICAGRELOR 90 MG PO TABS
90.0000 mg | ORAL_TABLET | Freq: Two times a day (BID) | ORAL | Status: DC
  Administered 2018-11-12 (×2): 90 mg via ORAL
  Filled 2018-11-11 (×2): qty 1

## 2018-11-11 MED ORDER — ONDANSETRON HCL 4 MG/2ML IJ SOLN: 4.0000 mg | Freq: Four times a day (QID) | INTRAMUSCULAR | Status: DC | PRN

## 2018-11-11 MED ORDER — SODIUM CHLORIDE 0.9 % IV SOLN: 250.0000 mL | INTRAVENOUS | Status: DC | PRN

## 2018-11-11 SURGICAL SUPPLY — 19 items
BALLN SAPPHIRE ~~LOC~~ 2.5X12 (BALLOONS) ×1 IMPLANT
BALLN WOLVERINE 2.00X10 (BALLOONS) ×2
BALLOON WOLVERINE 2.00X10 (BALLOONS) IMPLANT
CATH INFINITI 5FR ANG PIGTAIL (CATHETERS) ×1 IMPLANT
CATH OPTITORQUE TIG 4.0 5F (CATHETERS) ×1 IMPLANT
CATH VISTA GUIDE 6FR XB3.5 (CATHETERS) ×1 IMPLANT
DEVICE RAD COMP TR BAND LRG (VASCULAR PRODUCTS) ×1 IMPLANT
GLIDESHEATH SLEND SS 6F .021 (SHEATH) ×1 IMPLANT
GUIDEWIRE INQWIRE 1.5J.035X260 (WIRE) IMPLANT
INQWIRE 1.5J .035X260CM (WIRE) ×2
KIT ENCORE 26 ADVANTAGE (KITS) ×1 IMPLANT
KIT HEART LEFT (KITS) ×2 IMPLANT
PACK CARDIAC CATHETERIZATION (CUSTOM PROCEDURE TRAY) ×2 IMPLANT
SHEATH PROBE COVER 6X72 (BAG) ×1 IMPLANT
STENT RESOLUTE ONYX 2.5X18 (Permanent Stent) ×1 IMPLANT
SYR MEDRAD MARK 7 150ML (SYRINGE) ×2 IMPLANT
TRANSDUCER W/STOPCOCK (MISCELLANEOUS) ×2 IMPLANT
TUBING CIL FLEX 10 FLL-RA (TUBING) ×2 IMPLANT
WIRE ASAHI SION 190X3X12 .014 (WIRE) ×1 IMPLANT

## 2018-11-11 NOTE — Progress Notes (Signed)
Patient had small hematoma proximal to site a few minutes after arrival, pressure held and noted bruising with no hematoma. Oozing noted under band when assessed. Cath lab staff on unit transferring another patient and asked to assess site. They stated that the band looked like it was properly placed.  Continued to attempt to deflate band with oozing and firmness at proximal site. Only 2 mls removed 2 times. All other deflations needed to be reinflated because of oozing or firmness noted proximal to site. IV Right Forearm was just below band and tubing over band. IV removed.  Loma Messing into pull sheath on another patient and asked to assess the TR band. She noted it was to distal to site and needed to be repositioned.  TR band repositioned and inflated with 10 mls. At this time bruising noted proximal to site, no hematoma. Reported to Randell Loop RN

## 2018-11-11 NOTE — TOC Benefit Eligibility Note (Signed)
Transition of Care [facility]) Benefit Eligibility Note    Patient Details  Name: Eugene Taylor MRN: [id number] Date of Birth: [birth year]   Medication/Dose: Marden Noble  90mg  BID  Covered?: Yes  Prescription Coverage Preferred Pharmacy: [facility]  Spoke with Person/Company/Phone Number:: [employer] [phone]  Co-Pay: $40.00  Prior Approval: No     Orson Aloe Phone Number: 11/11/2018, 3:30 PM

## 2018-11-12 ENCOUNTER — Encounter (HOSPITAL_COMMUNITY): Payer: Self-pay | Admitting: Cardiovascular Disease

## 2018-11-12 DIAGNOSIS — Q231 Congenital insufficiency of aortic valve: Secondary | ICD-10-CM | POA: Diagnosis not present

## 2018-11-12 DIAGNOSIS — I10 Essential (primary) hypertension: Secondary | ICD-10-CM | POA: Diagnosis not present

## 2018-11-12 DIAGNOSIS — I25118 Atherosclerotic heart disease of native coronary artery with other forms of angina pectoris: Secondary | ICD-10-CM | POA: Diagnosis not present

## 2018-11-12 DIAGNOSIS — I209 Angina pectoris, unspecified: Secondary | ICD-10-CM

## 2018-11-12 DIAGNOSIS — I300 Acute nonspecific idiopathic pericarditis: Secondary | ICD-10-CM | POA: Diagnosis not present

## 2018-11-12 LAB — CBC
HCT: 44.1 % (ref 39.0–52.0)
Hemoglobin: 14.6 g/dL (ref 13.0–17.0)
MCH: 30.3 pg (ref 26.0–34.0)
MCHC: 33.1 g/dL (ref 30.0–36.0)
MCV: 91.5 fL (ref 80.0–100.0)
Platelets: 208 10*3/uL (ref 150–400)
RBC: 4.82 MIL/uL (ref 4.22–5.81)
RDW: 13.4 % (ref 11.5–15.5)
WBC: 7.3 10*3/uL (ref 4.0–10.5)
nRBC: 0 % (ref 0.0–0.2)

## 2018-11-12 LAB — BASIC METABOLIC PANEL
Anion gap: 6 (ref 5–15)
BUN: 15 mg/dL (ref 8–23)
CO2: 23 mmol/L (ref 22–32)
Calcium: 8.6 mg/dL — ABNORMAL LOW (ref 8.9–10.3)
Chloride: 110 mmol/L (ref 98–111)
Creatinine, Ser: 1.12 mg/dL (ref 0.61–1.24)
GFR calc Af Amer: >60 mL/min (ref >60)
GFR calc non Af Amer: >60 mL/min (ref >60)
Glucose, Bld: 97 mg/dL (ref 70–99)
Potassium: 4 mmol/L (ref 3.5–5.1)
Sodium: 139 mmol/L (ref 135–145)

## 2018-11-12 LAB — HEPATIC FUNCTION PANEL
ALT: 31 U/L (ref 0–44)
AST: 22 U/L (ref 15–41)
Albumin: 3.3 g/dL — ABNORMAL LOW (ref 3.5–5.0)
Alkaline Phosphatase: 51 U/L (ref 38–126)
Bilirubin, Direct: 0.2 mg/dL (ref 0.0–0.2)
Indirect Bilirubin: 0.8 mg/dL (ref 0.3–0.9)
Total Bilirubin: 1 mg/dL (ref 0.3–1.2)
Total Protein: 6 g/dL — ABNORMAL LOW (ref 6.5–8.1)

## 2018-11-12 MED ORDER — NITROGLYCERIN 0.4 MG SL SUBL: 0.4000 mg | SUBLINGUAL_TABLET | SUBLINGUAL | 3 refills | Status: DC | PRN

## 2018-11-12 MED ORDER — ROSUVASTATIN CALCIUM 40 MG PO TABS: 40.0000 mg | ORAL_TABLET | Freq: Every day | ORAL | 1 refills | Status: DC

## 2018-11-12 MED ORDER — METOPROLOL TARTRATE 25 MG PO TABS: 12.5000 mg | ORAL_TABLET | Freq: Two times a day (BID) | ORAL | 2 refills | Status: DC

## 2018-11-12 MED ORDER — TICAGRELOR 90 MG PO TABS: 90.0000 mg | ORAL_TABLET | Freq: Two times a day (BID) | ORAL | 2 refills | Status: DC

## 2018-11-12 MED FILL — METOPROLOL TARTRATE 25 MG T: 25 | 60 days supply | Qty: 60 | Fill #0 | Status: TO

## 2018-11-12 MED FILL — NITROGLYCERIN 0.4 MG TAB SL: 0.4 | 30 days supply | Qty: 25 | Fill #0 | Status: TO

## 2018-11-12 MED FILL — BRILINTA 90 MG TABLET: 90 | 30 days supply | Qty: 60 | Fill #0 | Status: TO

## 2018-11-12 NOTE — Progress Notes (Signed)
CARDIAC REHAB PHASE I   PRE:  Rate/Rhythm: 77 SR    BP: sitting 108/73    SaO2:   MODE:  Ambulation: 1000 ft   POST:  Rate/Rhythm: 93 SR    BP: sitting 148/78     SaO2:   Tolerated well, no c/o. Ed completed with great reception (wife present). Understands the importance of Brilinta/ASA. Will refer to [place] CRPII. Pt does ex and is cognizant of diet. [id number]   Harriet Masson CES, ACSM 11/12/2018 9:24 AM

## 2018-11-12 NOTE — TOC Transition Note (Signed)
Transition of Care [facility]) - CM/SW Discharge Note   Patient Details  Name: Eugene Taylor MRN: [id number] Date of Birth: May 23, 1951  [facility] of [facility]) CM/SW Contact:  Leone Haven, RN Phone Number: 11/12/2018, 10:19 AM   Clinical Narrative:    Patient from home, pta indep, s/p stent intervention, will be on brilinta, RN will give him 30 day coupon,  [facility] pharmacy is feeling the 30 day free for patinet.  NCM informed patient of the co pay for refills and that cardiologist has samples in the office if needed.  Patient for dc today, no other needs.    Final next level of care: Home/Self Care Barriers to Discharge: No Barriers Identified   Patient Goals and CMS Choice Patient states their goals for this hospitalization and ongoing recovery are:: to go home and get better      Discharge Placement                       Discharge Plan and Services                        Social Determinants of Health (SDOH) Interventions     Readmission Risk Interventions No flowsheet data found.

## 2018-11-12 NOTE — Discharge Summary (Addendum)
Discharge Summary    Patient ID: Eugene Taylor,  MRN: [id number], DOB/AGE: Jun 12, 1951 68 y.o.  Admit date: 11/11/2018 Discharge date: 11/12/2018  Primary Care Provider: [facility] Primary Cardiologist: Eugene Taylor  Discharge Diagnoses    Active Problems:   Angina pectoris Eugene Taylor)   Coronary artery disease of native artery of native heart with stable angina pectoris (HCC)   Abnormal cardiac CT angiography   CAD in native artery   Allergies Allergies  Allergen Reactions   Penicillins Other (See Comments)    Did it involve swelling of the face/tongue/throat, SOB, or low BP? Unknown Did it involve sudden or severe rash/hives, skin peeling, or any reaction on the inside of your mouth or nose? Unknown Did you need to seek medical attention at a [facility] or doctor's office? Unknown When did it last happen?Childhood allergy If all above answers are NO, may proceed with cephalosporin use.    Hydrocodeine [Dihydrocodeine] Itching    Diagnostic Studies/Procedures    Cath: 11/11/2018   Prox RCA lesion is 20% stenosed.  Post Atrio lesion is 20% stenosed.  Ost Cx lesion is 35% stenosed.  Prox Cx to Mid Cx lesion is 85% stenosed.  Ost 2nd Mrg to 2nd Mrg lesion is 80% stenosed.  1st Diag lesion is 50% stenosed.  Ost LAD to Prox LAD lesion is 20% stenosed.  Prox LAD lesion is 55% stenosed.  Prox LAD to Mid LAD lesion is 30% stenosed.  Dist LAD lesion is 80% stenosed.  Post intervention, there is a 0% residual stenosis.  A stent was successfully placed.   Evidence for multivessel coronary calcification with most prominent calcification in the proximal to mid LAD.    The LAD has smooth 20% proximal narrowing.  The first diagonal vessel has a superior branch that has mid narrowing of 50%.  Beyond the first diagonal vessel the LAD has calcification proximally and inferiorly and probable 50 to 60% narrowing followed by 30% irregularity to the mid segment.  The  apical portion of the LAD has 80% stenosis.  The left circumflex has 30-40% focal ostial stenosis and has an eccentric 85% stenosis after a small left atrial circumflex branch and before a very small second marginal vessel.  The second marginal vessel has ostial proximal narrowing of 80%.  The RCA is a dominant vessel with mild calcification with 20% proximal narrowing and 20% smooth narrowing in the proximal PLA.  Successful PCI with a Eugene Taylor predilatation and ultimate DES stenting with a 2.5 x 18 mm Resolute Onyx stent postdilated to approximately 2.5 mm with the 85% stenosis being reduced to 0% and brisk TIMI-3 flow.  RECOMMENDATION: The patient has been on telmisartan HCT for hypertension and was just given a prescription to start rosuvastatin 10 mg.  Since he is never had any edema, consider discontinuing telmisartan HCT and consider possible therapy with amlodipine and beta-blocker therapy for anti-ischemic benefit and possible low dose ARB if additional BP control is needed.  Recommend dual antiplatelet therapy and with significant concomitant CAD for at least 1 year.  Will increase rosuvastatin to 40 mg for optimal LDL management and if unable to reach a target less than 70 consider adding Zetia 10 mg or possible PCSK9 inhibition.  If patient develops recurrent symptomatology on anti-ischemic medication, consider intervention to the mid LAD with possible atherectomy due to superior and inferior calcification. _____________   History of Present Illness     Eugene Taylor is a 68 y.o. male  with a hx of recurrent idiopathic pericarditis a bicuspid aortic valve with mild stenosis mild regurgitation and mild enlargement of the a sending thoracic aorta 40 mm October 2019 as well as hypertensionlast seen 10/12/17 and was continued on treatment with his nonsteroidal anti-inflammatory drug and colchicine  He subsequent a cardiac CTA performed with a high CT calcium score severe  proximal LAD disease involving the first diagonal and proximal mid left circumflex.  His FFR returned yesterday and shows flow-limiting stenosis in both vessels.  Eugene Taylor contacted the patient regarding his high risk anatomy and after discussion of options benefits and risk he will undergo coronary angiography.  His pericardium was normal and he did not have enlargement of his aorta.  Taylor Course     Underwent cardiac cath yesterday with Eugene Taylor noted above with successful PCI/DESx1 with cutting Taylor to the mLCx with residual disease in the mLAD 55% with 80% in the apical LAD. Placed on DAPT with ASA/Brilinta. Plan for medical therapy for residual diease. Crestor increased from 10mg  to 40mg  daily. Added metoprolol, and will continue on home ARB/HCTZ combination for now as he was just started this medication. Could consider added amlodipine for anti-anginal affect if addition blood pressure medication needed. Worked well with cardiac rehab without recurrent chest pain. Of note he was being treated for idiopathic pericarditis on a colchicine/Ibuprofen taper. Has completed nearly 5 1/2 months of treatment. Will stop colchicine and ibuprofen given his need for DAPT with ASA/Brilinta.   General: Well developed, well nourished, male appearing in no acute distress. Head: Normocephalic, atraumatic.  Neck: Supple without bruits, JVD. Lungs:  Resp regular and unlabored, CTA. Heart: RRR, S1, S2, no S3, S4, or murmur; no rub. Abdomen: Soft, non-tender, non-distended with normoactive bowel sounds. No hepatomegaly. No rebound/guarding. No obvious abdominal masses. Extremities: No clubbing, cyanosis, edema. Distal pedal pulses are 2+ bilaterally. Right cath site stable without bruising or hematoma Neuro: Alert and oriented X 3. Moves all extremities spontaneously. Psych: Normal affect.  Eugene Taylor was seen by Dr. Excell Seltzer and determined stable for discharge home. Follow up in the office has been  arranged. Medications are listed below.   _____________  Discharge Vitals Blood pressure 109/82, pulse 62, temperature 97.7 F (36.5 C), temperature source Oral, resp. rate 20, height 5\' 11"  (1.803 m), weight 90.6 kg, SpO2 100 %.  Filed Weights   11/11/18 0631 11/12/18 0500  Weight: 93 kg 90.6 kg    Labs & Radiologic Studies    CBC Recent Labs    11/11/18 1544 11/12/18 0509  WBC 9.0 7.3  HGB 15.1 14.6  HCT 45.7 44.1  MCV 90.1 91.5  PLT 214 208   Basic Metabolic Panel Recent Labs    23/53/61 1544 11/12/18 0509  NA  --  139  K  --  4.0  CL  --  110  CO2  --  23  GLUCOSE  --  97  BUN  --  15  CREATININE 1.12 1.12  CALCIUM  --  8.6*   Liver Function Tests Recent Labs    11/12/18 0509  AST 22  ALT 31  ALKPHOS 51  BILITOT 1.0  PROT 6.0*  ALBUMIN 3.3*   No results for input(s): LIPASE, AMYLASE in the last 72 hours. Cardiac Enzymes No results for input(s): CKTOTAL, CKMB, CKMBINDEX, TROPONINI in the last 72 hours. BNP Invalid input(s): POCBNP D-Dimer No results for input(s): DDIMER in the last 72 hours. Hemoglobin A1C No results for input(s): HGBA1C in the  last 72 hours. Fasting Lipid Panel No results for input(s): CHOL, HDL, LDLCALC, TRIG, CHOLHDL, LDLDIRECT in the last 72 hours. Thyroid Function Tests No results for input(s): TSH, T4TOTAL, T3FREE, THYROIDAB in the last 72 hours.  Invalid input(s): FREET3 _____________  Ct Coronary Morph W/cta Cor W/score W/ca W/cm &/or Wo/cm  Addendum Date: 11/08/2018   ADDENDUM REPORT: 11/08/2018 09:55 CLINICAL DATA:  Chest pain EXAM: CT FFR MEDICATIONS: No additional medications TECHNIQUE: The coronary CT was sent for FFR. FINDINGS: FFR 0.62 distal LCx FFR 0.67 mid LAD FFR 0.66 mid D1 FFR 0.84 proximal PDA IMPRESSION: There is evidence for hemodynamically significant stenosis in the proximal LAD, ostial/proximal D1, and mid LCx. Dalton Mclean Electronically Signed   By: Marca Ancona M.D.   On: 11/08/2018 09:55    Addendum Date: 11/06/2018   ADDENDUM REPORT: 11/06/2018 17:45 CLINICAL DATA:  Chest pain EXAM: Cardiac CTA MEDICATIONS: Sub lingual nitro.  x 2 and lopressor mg TECHNIQUE: The patient was scanned on a Siemens 192 slice scanner. Gantry rotation speed was 250 msecs. Collimation was 0.6 mm. A 100 kV prospective scan was triggered in the ascending thoracic aorta at 35-75% of the R-R interval. Average HR during the scan was 60 bpm. The 3D data set was interpreted on a dedicated work station using MPR, MIP and VRT modes. A total of 80cc of contrast was used. FINDINGS: Non-cardiac: See separate report from [facility]. The pulmonary veins drain normally to the left atrium. Calcium Score: 782 Agatston units. Coronary Arteries: Right dominant with no anomalies LM: No plaque or stenosis. LAD system: The LAD gives rise to a large 1st diagonal. The proximal LAD and D1 are heavily involved by mixed plaque. Concern for severe (70-90%) stenosis in proximal LAD just beyond D1 ostium and in proximal D1. Circumflex system: Extensive mixed plaque proximal and mid LCx. Possible severe (70-90%) stenosis. RCA system: Mixed plaque in proximal, mid, and distal RCA. Suspect mild (<50%) stenosis in the proximal and mid RCA. Ostial PDA with mixed plaque, possible severe (70-90%) stenosis. IMPRESSION: 1. Coronary artery calcium score 782 Agatston units, placing the patient in the 89th percentile for age and gender, suggesting high risk for future cardiac events. 2. Concern for significant stenosis in the ostial PDA, proximal LAD, proximal D1, and proximal LCx. Will send for FFR. Dalton Mclean Electronically Signed   By: Marca Ancona M.D.   On: 11/06/2018 17:45   Result Date: 11/08/2018 EXAM: OVER-READ INTERPRETATION  CT CHEST The following report is an over-read performed by radiologist Dr. Trudie Reed of [facility] on 11/06/2018. This over-read does not include interpretation of cardiac or coronary anatomy  or pathology. The coronary calcium score/coronary CTA interpretation by the cardiologist is attached. COMPARISON:  None. FINDINGS: Aortic atherosclerosis. Within the visualized portions of the thorax there are no suspicious appearing pulmonary nodules or masses, there is no acute consolidative airspace disease, no pleural effusions, no pneumothorax and no lymphadenopathy. Visualized portions of the upper abdomen are unremarkable. There are no aggressive appearing lytic or blastic lesions noted in the visualized portions of the skeleton. IMPRESSION: 1.  Aortic Atherosclerosis (ICD10-I70.0). Electronically Signed: By: Trudie Reed M.D. On: 11/06/2018 09:24   Disposition   Pt is being discharged home today in good condition.  Follow-up Plans & Appointments    Follow-up Information    Baldo Daub, MD Follow up on 12/10/2018.   Specialty:  Cardiology Why:  at 8am for your follow up appt.  Contact information: [address] [employer]  [place] [address] [phone]          Discharge Instructions    Amb Referral to Cardiac Rehabilitation   Complete by:  As directed    Diagnosis:   Coronary Stents PTCA        Discharge Medications     Medication List    STOP taking these medications   colchicine 0.6 MG tablet   ibuprofen 200 MG tablet Commonly known as:  ADVIL,MOTRIN     TAKE these medications   aspirin EC 81 MG tablet Take 1 tablet (81 mg total) by mouth daily. Notes to patient:  Blood thinner Prevents clotting in stent and heart attack   cholecalciferol 25 MCG (1000 UT) tablet Commonly known as:  VITAMIN D3 Take 1,000 Units by mouth daily. Notes to patient:  Supplement    clindamycin 300 MG capsule Commonly known as:  CLEOCIN Take 2 capsules (600 mg total) by mouth as directed. Take 2 capsules ( ) 45 minutes prior to dental visits Notes to patient:  As needed before dentist visits   EpiPen 2-Pak 0.3 mg/0.3 mL Soaj injection Generic drug:  EPINEPHrine Inject  0.3 mg into the muscle as needed for anaphylaxis (red meat). Notes to patient:  As needed for anaphylaxis   metoprolol tartrate 25 MG tablet Commonly known as:  LOPRESSOR Take 0.5 tablets (12.5 mg total) by mouth 2 (two) times daily. Notes to patient:  Lowers blood pressure and heart rate Decreases work of the heart  Decreases force of contraction of heart muscle   nitroGLYCERIN 0.4 MG SL tablet Commonly known as:  Nitrostat Place 1 tablet (0.4 mg total) under the tongue every 5 (five) minutes as needed for chest pain. Notes to patient:  As needed for chest pain   rosuvastatin 40 MG tablet Commonly known as:  CRESTOR Take 1 tablet (40 mg total) by mouth daily. Start taking on:  November 13, 2018 What changed:    medication strength  how much to take   telmisartan-hydrochlorothiazide 40-12.5 MG tablet Commonly known as:  MICARDIS HCT Take 1 tablet by mouth daily. Notes to patient:  Lowers blood pressure Weak fluid medication    ticagrelor 90 MG Tabs tablet Commonly known as:  BRILINTA Take 1 tablet (90 mg total) by mouth 2 (two) times daily. Notes to patient:  Blood thinner Prevents clotting in stent and heart attack   vitamin B-12 1000 MCG tablet Commonly known as:  CYANOCOBALAMIN Take 1,000 mcg by mouth daily. Notes to patient:  Supplement         Acute coronary syndrome (MI, NSTEMI, STEMI, etc) this admission?: No.     Outstanding Labs/Studies   FLP/LFTs in 6 weeks if tolerating statin change.  Duration of Discharge Encounter   Greater than 30 minutes including physician time.  Signed, Laverda Page NP-C 11/12/2018, 11:47 AM   Patient seen, examined. Available data reviewed. Agree with findings, assessment, and plan as outlined by Laverda Page, NP.  On my exam the patient is alert and oriented in no distress.  Lung fields are clear, JVP is normal, heart is regular rate and rhythm with no murmur gallop, abdomen is soft and nontender, extremities have no  edema, the right radial site is clear.  The patient underwent uncomplicated PCI of the left circumflex yesterday.  He has moderate residual CAD with recommendations for medical therapy.  We discussed indication for aggressive lipid lowering with target LDL less than 70 mg/dL, adherence to dual antiplatelet therapy, as well as diet and lifestyle modification.  He is stable for Taylor discharge today.  Tonny Bollman, M.D. 11/12/2018 12:11 PM

## 2018-11-12 NOTE — Progress Notes (Signed)
TR BAND REMOVAL  LOCATION:    right radial  DEFLATED PER PROTOCOL:    Yes.    TIME BAND OFF / DRESSING APPLIED:    2130   SITE UPON ARRIVAL:    Level 1(small hematoma)  SITE AFTER BAND REMOVAL:    Level 1(bruising)  CIRCULATION SENSATION AND MOVEMENT:    Within Normal Limits   Yes.    COMMENTS:   Pt.tolerated procedure well

## 2018-11-13 MED FILL — Verapamil HCl IV Soln 2.5 MG/ML: INTRAVENOUS | Qty: 2 | Status: AC

## 2018-11-13 MED FILL — Nitroglycerin IV Soln 100 MCG/ML in D5W: INTRA_ARTERIAL | Qty: 10 | Status: AC

## 2018-11-16 NOTE — Telephone Encounter (Signed)
Please advise 

## 2018-11-30 ENCOUNTER — Other Ambulatory Visit: Payer: Self-pay | Admitting: Cardiology

## 2018-11-30 MED ORDER — TELMISARTAN-HCTZ 40-12.5 MG PO TABS: 1.0000 | ORAL_TABLET | Freq: Every day | ORAL | 1 refills | Status: DC

## 2018-11-30 NOTE — Telephone Encounter (Signed)
°*  STAT* If patient is at the pharmacy, call can be transferred to refill team.   1. Which medications need to be refilled? (please list name of each medication and dose if known) Telmisartan-HCTZ 40-12.5 2. Which pharmacy/location (including street and city if local pharmacy) is medication to be sent to? [facility] 3. Do they need a 30 day or 90 day supply? 90 day

## 2018-12-03 ENCOUNTER — Other Ambulatory Visit: Payer: Self-pay

## 2018-12-03 MED ORDER — TELMISARTAN-HCTZ 40-12.5 MG PO TABS: 1.0000 | ORAL_TABLET | Freq: Every day | ORAL | 1 refills | Status: DC

## 2018-12-03 NOTE — Telephone Encounter (Signed)
Talmisartan refill sent to [facility] per pt preference

## 2018-12-09 ENCOUNTER — Other Ambulatory Visit: Payer: Self-pay

## 2018-12-09 ENCOUNTER — Telehealth: Payer: Medicare Other | Admitting: Nurse Practitioner

## 2018-12-09 ENCOUNTER — Telehealth (INDEPENDENT_AMBULATORY_CARE_PROVIDER_SITE_OTHER): Payer: Medicare Other | Admitting: Cardiology

## 2018-12-09 ENCOUNTER — Encounter: Payer: Self-pay | Admitting: Cardiology

## 2018-12-09 VITALS — BP 125/75 | HR 71 | Ht 71.0 in | Wt 205.0 lb

## 2018-12-09 DIAGNOSIS — R05 Cough: Secondary | ICD-10-CM

## 2018-12-09 DIAGNOSIS — I25118 Atherosclerotic heart disease of native coronary artery with other forms of angina pectoris: Secondary | ICD-10-CM

## 2018-12-09 DIAGNOSIS — I10 Essential (primary) hypertension: Secondary | ICD-10-CM

## 2018-12-09 DIAGNOSIS — I300 Acute nonspecific idiopathic pericarditis: Secondary | ICD-10-CM

## 2018-12-09 DIAGNOSIS — R059 Cough, unspecified: Secondary | ICD-10-CM

## 2018-12-09 DIAGNOSIS — I350 Nonrheumatic aortic (valve) stenosis: Secondary | ICD-10-CM

## 2018-12-09 DIAGNOSIS — E782 Mixed hyperlipidemia: Secondary | ICD-10-CM

## 2018-12-09 DIAGNOSIS — Z7189 Other specified counseling: Secondary | ICD-10-CM

## 2018-12-09 MED ORDER — PREDNISONE 5 MG (21) PO TBPK: ORAL_TABLET | ORAL | 0 refills | Status: DC

## 2018-12-09 NOTE — Progress Notes (Signed)
Virtual Visit via Video Note   This visit type was conducted due to national recommendations for restrictions regarding the COVID-19 Pandemic (e.g. social distancing) in an effort to limit this patient's exposure and mitigate transmission in our community.  Due to his co-morbid illnesses, this patient is at least at moderate risk for complications without adequate follow up.  This format is felt to be most appropriate for this patient at this time.  All issues noted in this document were discussed and addressed.  A limited physical exam was performed with this format.  Please refer to the patient's chart for his consent to telehealth for [facility].   Evaluation Performed:  Follow-up visit  Date:  12/09/2018   ID:  Eugene Taylor, DOB 11-Aug-1951, MRN [id number]  Patient Location: Home Provider Location: Office  PCP:  Olive Bass, MD  Cardiologist:  No primary care provider on file. 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  He had no complication and has minimal aortic stenosis with a gradient of 9 mm on pullback.  1 month ago his CRP and sedimentation rate were both normal.   The patient does have symptoms concerning for COVID-19 infection (fever, chills, cough, or new shortness of breath).  With the pollen he has developed a cough but is not short of breath he has noticed wheezing he made a call to PCP he was prescribed prednisone and I think rather than taken  corticosteroid with COVID-19 outbreak I prefer to stop his beta-blocker he will monitor blood pressure at home and if he develops more typical symptoms of respiratory infection he will contact me and his primary care physician and he can use over-the-counter Coricidin and Zyrtec. He has had no angina shortness of breath palpitation syncope or TIA.   Past Medical History:  Diagnosis Date  . Acute idiopathic pericarditis 04/25/2016   Overview:  2017: hosp, EKG changes, colchicine/indomethacin  . Acute medial meniscal tear 12/15/2014  . Arthritis    LEFT SHOULDER  . Asthma   . Benign hypertension 11/27/2015  . Complication of anesthesia    " during a knee surgery , my bllod pressure was up & stayed up "  . Coronary artery disease   . External hemorrhoid, thrombosed 12/28/2015   Overview:  2017:   . History of adenomatous polyp of colon   . History of kidney stones   . Hyperlipidemia   . Hypertension   . Lower respiratory infection 02/08/2016  . Mixed hyperlipidemia 11/27/2015  . Obstructive sleep apnea 11/27/2015  . OSA on CPAP    uses cpap  . Right knee meniscal tear   . Screening for diabetes mellitus (DM) 11/27/2015  . Wears glasses    Past Surgical History:  Procedure Laterality Date  . APPENDECTOMY  age 49  . CARDIAC CATHETERIZATION  11/11/2018  . CHONDROPLASTY Right 12/15/2014   Procedure: CHONDROPLASTY;  Surgeon: Ollen Gross, MD;  Location: [facility];  Service: Orthopedics;  Laterality: Right;  . COLONOSCOPY W/ POLYPECTOMY  2011  . CORONARY STENT INTERVENTION  11/11/2018  . CORONARY STENT INTERVENTION N/A 11/11/2018   Procedure: CORONARY STENT INTERVENTION;  Surgeon: Lennette Bihari, MD;  Location: [facility] INVASIVE CV LAB;  Service: Cardiovascular;  Laterality: N/A;  . CYSTO/  URETEROSCOPIC STONE EXTRACTIONS  1995  . KNEE ARTHROSCOPY WITH LATERAL MENISECTOMY Right 12/15/2014   Procedure: KNEE ARTHROSCOPY WITH LATERAL MENISECTOMY;  Surgeon: Ollen Gross, MD;  Location:  [facility];  Service: Orthopedics;  Laterality: Right;  . KNEE ARTHROSCOPY WITH MEDIAL MENISECTOMY Right 12/15/2014   Procedure: KNEE ARTHROSCOPY WITH MEDIAL MENISECTOMY;  Surgeon: Ollen Gross, MD;  Location: [facility];  Service: Orthopedics;  Laterality: Right;  . LEFT HEART CATH AND CORONARY ANGIOGRAPHY N/A 11/11/2018   Procedure: LEFT HEART CATH AND CORONARY ANGIOGRAPHY;  Surgeon: Lennette Bihari, MD;  Location: [facility] INVASIVE CV LAB;  Service: Cardiovascular;  Laterality: N/A;  . NASAL SEPTUM SURGERY  1995     No outpatient medications have been marked as taking for the 12/09/18 encounter (Appointment) with Baldo Daub, MD.   Current Facility-Administered Medications for the 12/09/18 encounter (Appointment) with Baldo Daub, MD  Medication  . triamcinolone acetonide (KENALOG-40) injection 20 mg     Allergies:   Penicillins and Hydrocodeine [dihydrocodeine]   Social History   Tobacco Use  . Smoking status: Never Smoker  . Smokeless tobacco: Never Used  Substance Use Topics  . Alcohol use: No  . Drug use: No     Family Hx: The patient's family history includes Pancreatic cancer in his father; Stomach cancer in his mother.  ROS:   Please see the history of present illness.     All other systems reviewed and are negative.   Prior CV studies:   The following studies were reviewed today:  Procedures 11/11/18  CORONARY STENT INTERVENTION  LEFT HEART CATH AND CORONARY ANGIOGRAPHY  Conclusion      Prox RCA lesion is 20% stenosed.  Post Atrio lesion is 20% stenosed.  Ost Cx lesion is 35% stenosed.  Prox Cx to Mid Cx lesion is 85% stenosed.  Ost 2nd Mrg to 2nd Mrg lesion is 80% stenosed.  1st Diag lesion is 50% stenosed.  Ost LAD to Prox LAD lesion is 20% stenosed.  Prox LAD lesion is 55% stenosed.  Prox LAD to Mid LAD lesion is 30% stenosed.  Dist LAD lesion is 80% stenosed.  Post intervention, there is a 0% residual  stenosis.  A stent was successfully placed.   Evidence for multivessel coronary calcification with most prominent calcification in the proximal to mid LAD.     The LAD has smooth 20% proximal narrowing.  The first diagonal vessel has a superior branch that has mid narrowing of 50%.  Beyond the first diagonal vessel the LAD has calcification proximally and inferiorly and probable 50 to 60% narrowing followed by 30% irregularity to the mid segment.  The apical portion of the LAD has 80% stenosis.   The left circumflex has 30-40% focal ostial stenosis and has an eccentric 85% stenosis after a small left atrial circumflex branch and before a very small second marginal vessel.  The second marginal vessel has ostial proximal narrowing of 80%.   The RCA is a dominant vessel with mild calcification with 20% proximal narrowing and 20% smooth narrowing in the proximal PLA.   Successful PCI of the proximal LCF with a Wolverine cutting balloon predilatation and ultimate DES stenting with a 2.5 x 18 mm Resolute Onyx stent  postdilated to approximately 2.5 mm with the 85% stenosis being reduced to 0% and brisk TIMI-3 flow.   His aortic valve gradient was minimal 9 mmHg on pullback  Labs/Other Tests and Data Reviewed:    EKG:  No ECG reviewed.  Recent Labs: 11/12/2018: ALT 31; BUN 15; Creatinine, Ser 1.12; Hemoglobin 14.6; Platelets 208; Potassium 4.0; Sodium 139   Recent Lipid Panel   10/01/2018 total cholesterol 109 LDL 63 HDL 36 triglyceride 114, his baseline cholesterol was 250 triglyceride 293 and LDL 198. No results found for: CHOL, TRIG, HDL, CHOLHDL, LDLCALC, LDLDIRECT  Wt Readings from Last 3 Encounters:  11/12/18 199 lb 11.2 oz (90.6 kg)  11/09/18 204 lb 3.2 oz (92.6 kg)  10/12/18 208 lb 3.2 oz (94.4 kg)     Objective:    Vital Signs:  There were no vitals taken for this visit.    Constitutional, well-nourished well-developed in no acute distress Vital signs reviewed Eyes, conjunctiva  and sclera are normal without pallor or icterus extraocular motions intact and normal there is no lid lag Respiratory, normal effort and excursion no audible wheezing without a stethoscope Cardiovascular, no neck vein distention or peripheral edema Skin, no rash skin lesion or ulceration of the extremities there is no ecchymosis or swelling at the radial artery cannulation site Neurologic, cranial nerves II to XII are grossly intact and the patient moves all 4 extremities Neuro/Psychiatric, judgment and thought processes are intact and coherent, alert and oriented x3, mood and affect appear normal.  ASSESSMENT & PLAN:    1. CAD stable continue dual antiplatelet therapy lipid-lowering and I think we can go ahead and stop his beta-blocker that clinically appears to be causing cough and bronchospasm.  For the time being I told him to hold on corticosteroids both to avoid suppressing his immune system COVID-19 and also at times in individuals with pericarditis cortical screw steroid therapy can be with difficult withdrawal in the future. 2. Pericarditis is improved presently is off colchicine 3. Market improvement continue his high intensity statin lipids at target 4. Stable monitor blood pressure off beta-blocker if systolics are greater than 150 he will contact my office 5. Stable he had very low gradient on recent heart catheterization 6. He does raise concern with the symptom of cough but he is not becoming progressively ill not systemically sick and will need to be in touch should his symptoms become more typical or worrisome for COVID-19  COVID-19 Education: The signs and symptoms of COVID-19 were discussed with the patient and how to seek care for testing (follow up with PCP or arrange E-visit).  The importance of social distancing was discussed today.  Time:   Today, I have spent 30 minutes with the patient with telehealth technology discussing the above problems.     Medication  Adjustments/Labs and Tests Ordered: Current medicines are reviewed at length with the patient today.  Concerns regarding medicines are outlined above.   Tests Ordered: No orders of the defined types were placed in this encounter.   Medication Changes: No orders of the defined types were placed in this encounter.   Disposition:  Follow up in 2 week(s) with withdrawal of his beta-blocker and cough in many ways just to be sure  Signed, Norman HerrlichBrian Jayliah Benett, MD  12/09/2018 1:28 PM    [facility]

## 2018-12-09 NOTE — Progress Notes (Signed)
We are sorry that you are not feeling well.  Here is how we plan to help!  Based on your presentation I believe you most likely have A cough due to a virus.  This is called viral bronchitis and is best treated by rest, plenty of fluids and control of the cough.  You may use Ibuprofen or Tylenol as directed to help your symptoms.     In addition you may use A non-prescription cough medication called Mucinex DM: take 2 tablets every 12 hours.  Prednisone 5 mg daily for 6 days (see taper instructions below)  Directions for 6 day taper: Day 1: 2 tablets before breakfast, 1 after both lunch & dinner and 2 at bedtime Day 2: 1 tab before breakfast, 1 after both lunch & dinner and 2 at bedtime Day 3: 1 tab at each meal & 1 at bedtime Day 4: 1 tab at breakfast, 1 at lunch, 1 at bedtime Day 5: 1 tab at breakfast & 1 tab at bedtime Day 6: 1 tab at breakfast   From your responses in the eVisit questionnaire you describe inflammation in the upper respiratory tract which is causing a significant cough.  This is commonly called Bronchitis and has four common causes:    Allergies  Viral Infections  Acid Reflux  Bacterial Infection Allergies, viruses and acid reflux are treated by controlling symptoms or eliminating the cause. An example might be a cough caused by taking certain blood pressure medications. You stop the cough by changing the medication. Another example might be a cough caused by acid reflux. Controlling the reflux helps control the cough.  USE OF BRONCHODILATOR ("RESCUE") INHALERS: There is a risk from using your bronchodilator too frequently.  The risk is that over-reliance on a medication which only relaxes the muscles surrounding the breathing tubes can reduce the effectiveness of medications prescribed to reduce swelling and congestion of the tubes themselves.  Although you feel brief relief from the bronchodilator inhaler, your asthma may actually be worsening with the tubes becoming  more swollen and filled with mucus.  This can delay other crucial treatments, such as oral steroid medications. If you need to use a bronchodilator inhaler daily, several times per day, you should discuss this with your provider.  There are probably better treatments that could be used to keep your asthma under control.     HOME CARE . Only take medications as instructed by your medical team. . Complete the entire course of an antibiotic. . Drink plenty of fluids and get plenty of rest. . Avoid close contacts especially the very young and the elderly . Cover your mouth if you cough or cough into your sleeve. . Always remember to wash your hands . A steam or ultrasonic humidifier can help congestion.   GET HELP RIGHT AWAY IF: . You develop worsening fever. . You become short of breath . You cough up blood. . Your symptoms persist after you have completed your treatment plan MAKE SURE YOU   Understand these instructions.  Will watch your condition.  Will get help right away if you are not doing well or get worse.  Your e-visit answers were reviewed by a board certified advanced clinical practitioner to complete your personal care plan.  Depending on the condition, your plan could have included both over the counter or prescription medications. If there is a problem please reply  once you have received a response from your provider. Your safety is important to us.  If you   have drug allergies check your prescription carefully.    You can use MyChart to ask questions about today's visit, request a non-urgent call back, or ask for a work or school excuse for 24 hours related to this e-Visit. If it has been greater than 24 hours you will need to follow up with your provider, or enter a new e-Visit to address those concerns. You will get an e-mail in the next two days asking about your experience.  I hope that your e-visit has been valuable and will speed your recovery. Thank you for using  e-visits.  5 minutes spent reviewing and documenting in chart.    

## 2018-12-09 NOTE — Patient Instructions (Signed)
Medication Instructions:  Your physician has recommended you make the following change in your medication:  STOP: METOPROLOL  If you need a refill on your cardiac medications before your next appointment, please call your pharmacy.   Lab work: None If you have labs (blood work) drawn today and your tests are completely normal, you will receive your results only by: Marland Kitchen MyChart Message (if you have MyChart) OR . A paper copy in the mail If you have any lab test that is abnormal or we need to change your treatment, we will call you to review the results.  Testing/Procedures: None  Follow-Up: At [facility], you and your health needs are our priority.  As part of our continuing mission to provide you with exceptional heart care, we have created designated Provider Care Teams.  These Care Teams include your primary Cardiologist (physician) and Advanced Practice Providers (APPs -  Physician Assistants and Nurse Practitioners) who all work together to provide you with the care you need, when you need it. You will need a follow up appointment in 2 weeks.   Any Other Special Instructions Will Be Listed Below (If Applicable).   Blood Pressure Record Sheet To take your blood pressure, you will need a blood pressure machine. You can buy a blood pressure machine (blood pressure monitor) at your clinic, drug store, or online. When choosing one, consider:  An automatic monitor that has an arm cuff.  A cuff that wraps snugly around your upper arm. You should be able to fit only one finger between your arm and the cuff.  A device that stores blood pressure reading results.  Do not choose a monitor that measures your blood pressure from your wrist or finger. Follow your health care provider's instructions for how to take your blood pressure. To use this form:  Get one reading in the morning (a.m.) before you take any medicines.  Get one reading in the evening (p.m.) before supper.  Take at  least 2 readings with each blood pressure check. This makes sure the results are correct. Wait 1-2 minutes between measurements.  Write down the results in the spaces on this form.  Repeat this once a week, or as told by your health care provider.  Make a follow-up appointment with your health care provider to discuss the results. Blood pressure log Date: _______________________  a.m. _____________________(1st reading) _____________________(2nd reading)  p.m. _____________________(1st reading) _____________________(2nd reading) Date: _______________________  a.m. _____________________(1st reading) _____________________(2nd reading)  p.m. _____________________(1st reading) _____________________(2nd reading) Date: _______________________  a.m. _____________________(1st reading) _____________________(2nd reading)  p.m. _____________________(1st reading) _____________________(2nd reading) Date: _______________________  a.m. _____________________(1st reading) _____________________(2nd reading)  p.m. _____________________(1st reading) _____________________(2nd reading) Date: _______________________  a.m. _____________________(1st reading) _____________________(2nd reading)  p.m. _____________________(1st reading) _____________________(2nd reading) This information is not intended to replace advice given to you by your health care provider. Make sure you discuss any questions you have with your health care provider. Document Released: 05/11/2003 Document Revised: 08/12/2017 Document Reviewed: 08/12/2017 Elsevier Interactive Patient Education  2019 [employer].

## 2018-12-09 NOTE — Addendum Note (Signed)
Addended by: Fayrene Fearing B on: 12/09/2018 03:03 PM   Modules accepted: Orders

## 2018-12-10 ENCOUNTER — Ambulatory Visit: Payer: Medicare Other | Admitting: Cardiology

## 2018-12-10 ENCOUNTER — Telehealth: Payer: Medicare Other | Admitting: Cardiology

## 2018-12-26 NOTE — Progress Notes (Signed)
Virtual Visit via Video Note   This visit type was conducted due to national recommendations for restrictions regarding the COVID-19 Pandemic (e.g. social distancing) in an effort to limit this patient's exposure and mitigate transmission in our community.  Due to his co-morbid illnesses, this patient is at least at moderate risk for complications without adequate follow up.  This format is felt to be most appropriate for this patient at this time.  All issues noted in this document were discussed and addressed.  A limited physical exam was performed with this format.  Please refer to the patient's chart for his consent to telehealth [facility].   Date:  12/28/2018   ID:  Eugene Taylor, DOB 11/29/1950, MRN [id number]  Patient Location: Home Provider Location: Home  PCP:  Olive Bassough, Robert L, MD  Cardiologist:  No primary care provider on file. 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 He is quite improved his malaise resolved off beta-blocker he feels stronger he went golfing he is working in the garden and has no exercise intolerance edema shortness of breath chest pain palpitation or syncope.  He is not worried any longer.  He starts cardiac rehabilitation tomorrow.  The patient does not have symptoms concerning for COVID-19 infection (fever, chills, cough, or new shortness of breath).    Past Medical History:  Diagnosis Date  . Acute idiopathic pericarditis 04/25/2016   Overview:  2017: hosp, EKG changes, colchicine/indomethacin  . Acute medial meniscal  tear 12/15/2014  . Arthritis    LEFT SHOULDER  . Asthma   . Benign hypertension 11/27/2015  . Complication of anesthesia    " during a knee surgery , my bllod pressure was up & stayed up "  . Coronary artery disease   . External hemorrhoid, thrombosed 12/28/2015   Overview:  2017:   . History of adenomatous polyp of colon   . History of kidney stones   . Hyperlipidemia   . Hypertension   . Lower respiratory infection 02/08/2016  . Mixed hyperlipidemia 11/27/2015  . Obstructive sleep apnea 11/27/2015  . OSA on CPAP    uses cpap  . Right knee meniscal tear   . Screening for diabetes mellitus (DM) 11/27/2015  . Wears glasses    Past Surgical History:  Procedure Laterality Date  . APPENDECTOMY  age 68  . CARDIAC CATHETERIZATION  11/11/2018  . CHONDROPLASTY Right 12/15/2014   Procedure: CHONDROPLASTY;  Surgeon: Ollen GrossFrank Aluisio, MD;  [facility] [place];  Service: Orthopedics;  Laterality: Right;  . COLONOSCOPY W/ POLYPECTOMY  2011  . CORONARY STENT INTERVENTION  11/11/2018  . CORONARY STENT INTERVENTION N/A 11/11/2018   Procedure: CORONARY STENT INTERVENTION;  Surgeon: Lennette BihariKelly, Thomas A, MD;  [facility];  Service: Cardiovascular;  Laterality: N/A;  . CYSTO/  URETEROSCOPIC STONE EXTRACTIONS  1995  . KNEE ARTHROSCOPY WITH LATERAL MENISECTOMY Right 12/15/2014   Procedure: KNEE ARTHROSCOPY WITH LATERAL MENISECTOMY;  Surgeon: Ollen GrossFrank Aluisio, MD;  [facility] [place];  Service: Orthopedics;  Laterality: Right;  . KNEE ARTHROSCOPY WITH  MEDIAL MENISECTOMY Right 12/15/2014   Procedure: KNEE ARTHROSCOPY WITH MEDIAL MENISECTOMY;  Surgeon: Ollen Gross, MD;  Location: [facility];  Service: Orthopedics;  Laterality: Right;  . LEFT HEART CATH AND CORONARY ANGIOGRAPHY N/A 11/11/2018   Procedure: LEFT HEART CATH AND CORONARY ANGIOGRAPHY;  Surgeon: Lennette Bihari, MD;  Location: [facility] CV LAB;  Service: Cardiovascular;  Laterality: N/A;  . NASAL  SEPTUM SURGERY  1995     Current Meds  Medication Sig  . aspirin EC 81 MG tablet Take 1 tablet (81 mg total) by mouth daily.  . cholecalciferol (VITAMIN D3) 25 MCG (1000 UT) tablet Take 1,000 Units by mouth daily.  . clindamycin (CLEOCIN) 300 MG capsule Take 2 capsules (600 mg total) by mouth as directed. Take 2 capsules ( ) 45 minutes prior to dental visits  . EPIPEN 2-PAK 0.3 MG/0.3ML SOAJ injection Inject 0.3 mg into the muscle as needed for anaphylaxis (red meat).   . nitroGLYCERIN (NITROSTAT) 0.4 MG SL tablet Place 1 tablet (0.4 mg total) under the tongue every 5 (five) minutes as needed for chest pain.  . rosuvastatin (CRESTOR) 10 MG tablet Take 10 mg by mouth daily.  Marland Kitchen telmisartan-hydrochlorothiazide (MICARDIS HCT) 40-12.5 MG tablet Take 1 tablet by mouth daily.  . ticagrelor (BRILINTA) 90 MG TABS tablet Take 1 tablet (90 mg total) by mouth 2 (two) times daily.  . vitamin B-12 (CYANOCOBALAMIN) 1000 MCG tablet Take 1,000 mcg by mouth daily.   Current Facility-Administered Medications for the 12/28/18 encounter (Telemedicine) with Baldo Daub, MD  Medication  . triamcinolone acetonide (KENALOG-40) injection 20 mg     Allergies:   Penicillins and Hydrocodeine [dihydrocodeine]   Social History   Tobacco Use  . Smoking status: Never Smoker  . Smokeless tobacco: Never Used  Substance Use Topics  . Alcohol use: No  . Drug use: No     Family Hx: The patient's family history includes Pancreatic cancer in his father; Stomach cancer in his mother.  ROS:   Please see the history of present illness.     All other systems reviewed and are negative.   Prior CV studies:   The following studies were reviewed today:   Labs/Other Tests and Data Reviewed:    EKG:  No ECG reviewed.  Recent Labs: 11/12/2018: ALT 31; BUN 15; Creatinine, Ser 1.12; Hemoglobin 14.6; Platelets 208; Potassium 4.0; Sodium 139   Recent Lipid Panel 10/01/2018 total cholesterol 109 LDL 63 HDL 36  triglyceride 114,  his baseline cholesterol was 250 triglyceride 293 and LDL 198.  Wt Readings from Last 3 Encounters:  12/28/18 206 lb (93.4 kg)  12/09/18 205 lb (93 kg)  11/12/18 199 lb 11.2 oz (90.6 kg)     Objective:    Vital Signs:  BP 115/71 (BP Location: Left Arm, Patient Position: Sitting)   Pulse 77   Ht  (1.803 m)   Wt 206 lb (93.4 kg)   BMI 28.73 kg/m    VITAL SIGNS:  reviewed GEN:  no acute distress EYES:  sclerae anicteric, EOMI - Extraocular Movements Intact RESPIRATORY:  normal respiratory effort, symmetric expansion CARDIOVASCULAR:  no peripheral edema SKIN:  no rash, lesions or ulcers. MUSCULOSKELETAL:  no obvious deformities. NEURO:  alert and oriented x 3, no obvious focal deficit PSYCH:  normal affect  ASSESSMENT & PLAN:    1. Coronary artery disease stable he is having no angina after revascularization and current medications and will continue his current treatment including dual antiplatelet therapy uninterrupted  for 1 year.  He will enter cardiac rehabilitation tomorrow. 2. Hypertension stable blood pressure target continue treatment including ARB thiazide diuretic.  He will follow-up labs with his PCP to look at renal function potassium. 3. Hyperlipidemia stable his last lipid profile was on target he will have a repeat done and if his LDLs are greater than 70 he will require an additional drug such as Zetia. 4. Pericarditis stable no recurrence 5. Aortic stenosis stable hemodynamically very mild.  COVID-19 Education: The signs and symptoms of COVID-19 were discussed with the patient and how to seek care for testing (follow up with PCP or arrange E-visit).  The importance of social distancing was discussed today.  Time:   Today, I have spent 20 minutes with the patient with telehealth technology discussing the above problems.     Medication Adjustments/Labs and Tests Ordered: Current medicines are reviewed at length with the patient today.   Concerns regarding medicines are outlined above.   Tests Ordered: No orders of the defined types were placed in this encounter.   Medication Changes: No orders of the defined types were placed in this encounter.   Disposition:  Follow up as scheduled June 2020  Signed, [name], MD  12/28/2018 2:06 PM    [facility]

## 2018-12-28 ENCOUNTER — Other Ambulatory Visit: Payer: Self-pay

## 2018-12-28 ENCOUNTER — Telehealth (INDEPENDENT_AMBULATORY_CARE_PROVIDER_SITE_OTHER): Payer: Medicare Other | Admitting: Cardiology

## 2018-12-28 ENCOUNTER — Encounter: Payer: Self-pay | Admitting: Cardiology

## 2018-12-28 VITALS — BP 115/71 | HR 77 | Ht 71.0 in | Wt 206.0 lb

## 2018-12-28 DIAGNOSIS — I10 Essential (primary) hypertension: Secondary | ICD-10-CM

## 2018-12-28 DIAGNOSIS — I300 Acute nonspecific idiopathic pericarditis: Secondary | ICD-10-CM

## 2018-12-28 DIAGNOSIS — E782 Mixed hyperlipidemia: Secondary | ICD-10-CM

## 2018-12-28 DIAGNOSIS — I350 Nonrheumatic aortic (valve) stenosis: Secondary | ICD-10-CM

## 2018-12-28 DIAGNOSIS — I25118 Atherosclerotic heart disease of native coronary artery with other forms of angina pectoris: Secondary | ICD-10-CM

## 2018-12-28 NOTE — Patient Instructions (Signed)
Medication Instructions:  Your physician recommends that you continue on your current medications as directed. Please refer to the Current Medication list given to you today.  If you need a refill on your cardiac medications before your next appointment, please call your pharmacy.   Lab work: Your physician recommends that you return for lab work at your primary care  doctor  If you have labs (blood work) drawn today and your tests are completely normal, you will receive your results only by: Marland Kitchen MyChart Message (if you have MyChart) OR . A paper copy in the mail If you have any lab test that is abnormal or we need to change your treatment, we will call you to review the results.  Testing/Procedures: None  Follow-Up: At [facility], you and your health needs are our priority.  As part of our continuing mission to provide you with exceptional heart care, we have created designated Provider Care Teams.  These Care Teams include your primary Cardiologist (physician) and Advanced Practice Providers (APPs -  Physician Assistants and Nurse Practitioners) who all work together to provide you with the care you need, when you need it. You will need a follow up appointment in 1 months.  Please call our office 2 months in advance to schedule this appointment.  You may see No primary care provider on file. or another member of our [employer] Provider Team in [place]: Gypsy Balsam, MD . Belva Crome, MD  Any Other Special Instructions Will Be Listed Below (If Applicable).

## 2019-01-19 ENCOUNTER — Ambulatory Visit: Payer: Medicare Other | Admitting: Cardiology

## 2019-01-22 ENCOUNTER — Telehealth: Payer: Self-pay | Admitting: Cardiology

## 2019-01-22 NOTE — Telephone Encounter (Signed)
Virtual Visit Pre-Appointment Phone Call  "(Name), I am calling you today to discuss your upcoming appointment. We are currently trying to limit exposure to the virus that causes COVID-19 by seeing patients at home rather than in the office."  1. "What is the BEST phone number to call the day of the visit?" - include this in appointment notes  2. Do you have or have access to (through a family member/friend) a smartphone with video capability that we can use for your visit?" a. If yes - list this number in appt notes as cell (if different from BEST phone #) and list the appointment type as a VIDEO visit in appointment notes b. If no - list the appointment type as a PHONE visit in appointment notes  Confirm consent - "In the setting of the current Covid19 crisis, you are scheduled for a (phone or video) visit with your provider on (date) at (time).  Just as we do with many in-office visits, in order for you to participate in this visit, we must obtain consent.  If you'd like, I can send this to your mychart (if signed up) or email for you to review.  Otherwise, I can obtain your verbal consent now.  All virtual visits are billed to your insurance company just like a normal visit would be.  By agreeing to a virtual visit, we'd like you to understand that the technology does not allow for your provider to perform an examination, and thus may limit your provider's ability to fully assess your condition. If your provider identifies any concerns that need to be evaluated in person, we will make arrangements to do so.  Finally, though the technology is pretty good, we cannot assure that it will always work on either your or our end, and in the setting of a video visit, we may have to convert it to a phone-only visit.  In either situation, we cannot ensure that we have a secure connection.  Are you willing to proceed?" STAFF: Did the patient verbally acknowledge consent to telehealth visit? Document  YES/NO here: YES 3. Advise patient to be prepared - "Two hours prior to your appointment, go ahead and check your blood pressure, pulse, oxygen saturation, and your weight (if you have the equipment to check those) and write them all down. When your visit starts, your provider will ask you for this information. If you have an Apple Watch or Kardia device, please plan to have heart rate information ready on the day of your appointment. Please have a pen and paper handy nearby the day of the visit as well."  4. Give patient instructions for MyChart download to smartphone OR [email or web address] as below if video visit (depending on what platform provider is using)  5. Inform patient they will receive a phone call 15 minutes prior to their appointment time (may be from unknown caller ID) so they should be prepared to answer    TELEPHONE CALL NOTE  Kapono D Wnek has been deemed a candidate for a follow-up tele-health visit to limit community exposure during the Covid-19 pandemic. I spoke with the patient via phone to ensure availability of phone/video source, confirm preferred email & phone number, and discuss instructions and expectations.  I reminded Glenda Mcloone Gosa to be prepared with any vital sign and/or heart rhythm information that could potentially be obtained via home monitoring, at the time of his visit. I reminded Levar Knierim Gowin to expect a phone call prior to his visit.  Samella ParrDenise Wilson 01/22/2019 12:09 PM   INSTRUCTIONS FOR DOWNLOADING THE MYCHART APP TO SMARTPHONE  - The patient must first make sure to have activated MyChart and know their login information - If Apple, go to [employer] Store and type in MyChart in the search bar and download the app. If Android, ask patient to go to [employer] Play Store and type in [place] in the search bar and download the app. The app is free but as with any other app downloads, their phone may require them to verify saved payment information or Apple/Android  password.  - The patient will need to then log into the app with their MyChart username and password, and select [place] as their healthcare provider to link the account. When it is time for your visit, go to the MyChart app, find appointments, and click Begin Video Visit. Be sure to Select Allow for your device to access the Microphone and Camera for your visit. You will then be connected, and your provider will be with you shortly.  **If they have any issues connecting, or need assistance please contact MyChart service desk [phone])**  **If using a computer, in order to ensure the best quality for their visit they will need to use either of the following Internet Browsers: [employer] Edge, or Google Chrome**  IF USING DOXIMITY or DOXY.ME - The patient will receive a link just prior to their visit by text.     FULL LENGTH CONSENT FOR TELE-HEALTH VISIT   I hereby voluntarily request, consent and authorize [facility] and its employed or contracted physicians, physician assistants, nurse practitioners or other licensed health care professionals (the Practitioner), to provide me with telemedicine health care services (the Services") as deemed necessary by the treating Practitioner. I acknowledge and consent to receive the Services by the Practitioner via telemedicine. I understand that the telemedicine visit will involve communicating with the Practitioner through live audiovisual communication technology and the disclosure of certain medical information by electronic transmission. I acknowledge that I have been given the opportunity to request an in-person assessment or other available alternative prior to the telemedicine visit and am voluntarily participating in the telemedicine visit.  I understand that I have the right to withhold or withdraw my consent to the use of telemedicine in the course of my care at any time, without affecting my right to future care or treatment,  and that the Practitioner or I may terminate the telemedicine visit at any time. I understand that I have the right to inspect all information obtained and/or recorded in the course of the telemedicine visit and may receive copies of available information for a reasonable fee.  I understand that some of the potential risks of receiving the Services via telemedicine include:   Delay or interruption in medical evaluation due to technological equipment failure or disruption;  Information transmitted may not be sufficient (e.g. poor resolution of images) to allow for appropriate medical decision making by the Practitioner; and/or   In rare instances, security protocols could fail, causing a breach of personal health information.  Furthermore, I acknowledge that it is my responsibility to provide information about my medical history, conditions and care that is complete and accurate to the best of my ability. I acknowledge that Practitioner's advice, recommendations, and/or decision may be based on factors not within their control, such as incomplete or inaccurate data provided by me or distortions of diagnostic images or specimens that may result from electronic transmissions. I understand that the practice  of medicine is not an Chief Strategy Officer and that Practitioner makes no warranties or guarantees regarding treatment outcomes. I acknowledge that I will receive a copy of this consent concurrently upon execution via email to the email address I last provided but may also request a printed copy by calling the office of [place].    I understand that my insurance will be billed for this visit.   I have read or had this consent read to me.  I understand the contents of this consent, which adequately explains the benefits and risks of the Services being provided via telemedicine.   I have been provided ample opportunity to ask questions regarding this consent and the Services and have had my questions  answered to my satisfaction.  I give my informed consent for the services to be provided through the use of telemedicine in my medical care  By participating in this telemedicine visit I agree to the above.

## 2019-01-22 NOTE — Progress Notes (Signed)
Virtual Visit via Video Note   This visit type was conducted due to national recommendations for restrictions regarding the COVID-19 Pandemic (e.g. social distancing) in an effort to limit this patient's exposure and mitigate transmission in our community.  Due to his co-morbid illnesses, this patient is at least at moderate risk for complications without adequate follow up.  This format is felt to be most appropriate for this patient at this time.  All issues noted in this document were discussed and addressed.  A limited physical exam was performed with this format.  Please refer to the patient's chart for his consent to telehealth for [facility].   Date:  01/25/2019   ID:  Eugene Taylor, DOB February 19, 1951, MRN [id number]  Patient Location: Home Provider Location: Office  PCP:  Olive Bass, MD  Cardiologist:  [name], MD  Electrophysiologist:  None   Evaluation Performed:  Follow-Up Visit  Chief Complaint:  FU for CAD  History of Present Illness:    Eugene Taylor is a 68 y.o. male with a history of recurrent idiopathic pericarditis a bicuspid aortic valve with mild stenosis mild regurgitation and mild enlargement of the ascending thoracic aorta 40 mm October 2019 as well as hypertension and CAD with PCI and stent of his proximal LCFx 11/11/18 last seen 12/28/18.   The patient does not have symptoms concerning for COVID-19 infection (fever, chills, cough, or new shortness of breath).    Past Medical History:  Diagnosis Date  . Acute idiopathic pericarditis 04/25/2016   Overview:  2017: hosp, EKG changes, colchicine/indomethacin  . Acute medial meniscal tear 12/15/2014  . Angina pectoris (HCC) 05/05/2018  . Arthritis    LEFT SHOULDER  . Asthma   . Benign hypertension 11/27/2015  . Complication of anesthesia    " during a knee surgery , my bllod pressure was up & stayed up "  . Coronary artery disease   . External hemorrhoid, thrombosed 12/28/2015   Overview:  2017:    . History of adenomatous polyp of colon   . History of kidney stones   . Hyperlipidemia   . Hypertension   . Lower respiratory infection 02/08/2016  . Mixed hyperlipidemia 11/27/2015  . Obstructive sleep apnea 11/27/2015  . OSA on CPAP    uses cpap  . Right knee meniscal tear   . Screening for diabetes mellitus (DM) 11/27/2015  . Wears glasses    Past Surgical History:  Procedure Laterality Date  . APPENDECTOMY  age 35  . CARDIAC CATHETERIZATION  11/11/2018  . CHONDROPLASTY Right 12/15/2014   Procedure: CHONDROPLASTY;  Surgeon: Ollen Gross, MD;  Location: [facility];  Service: Orthopedics;  Laterality: Right;  . COLONOSCOPY W/ POLYPECTOMY  2011  . CORONARY STENT INTERVENTION  11/11/2018  . CORONARY STENT INTERVENTION N/A 11/11/2018   Procedure: CORONARY STENT INTERVENTION;  Surgeon: Lennette Bihari, MD;  Location: [facility] INVASIVE CV LAB;  Service: Cardiovascular;  Laterality: N/A;  . CYSTO/  URETEROSCOPIC STONE EXTRACTIONS  1995  . KNEE ARTHROSCOPY WITH LATERAL MENISECTOMY Right 12/15/2014   Procedure: KNEE ARTHROSCOPY WITH LATERAL MENISECTOMY;  Surgeon: Ollen Gross, MD;  Location: [facility];  Service: Orthopedics;  Laterality: Right;  . KNEE ARTHROSCOPY WITH MEDIAL MENISECTOMY Right 12/15/2014   Procedure: KNEE ARTHROSCOPY WITH MEDIAL MENISECTOMY;  Surgeon: Ollen Gross, MD;  Location: [facility];  Service: Orthopedics;  Laterality: Right;  . LEFT HEART CATH AND CORONARY ANGIOGRAPHY N/A 11/11/2018   Procedure: LEFT HEART CATH AND  CORONARY ANGIOGRAPHY;  Surgeon: Lennette Bihari, MD;  Location: [facility] INVASIVE CV LAB;  Service: Cardiovascular;  Laterality: N/A;  . NASAL SEPTUM SURGERY  1995     Current Meds  Medication Sig  . aspirin EC 81 MG tablet Take 1 tablet (81 mg total) by mouth daily.  . cholecalciferol (VITAMIN D3) 25 MCG (1000 UT) tablet Take 1,000 Units by mouth daily.  . clindamycin (CLEOCIN) 300 MG capsule Take 2 capsules (600 mg  total) by mouth as directed. Take 2 capsules (600mg ) 45 minutes prior to dental visits  . EPIPEN 2-PAK 0.3 MG/0.3ML SOAJ injection Inject 0.3 mg into the muscle as needed for anaphylaxis (red meat).   . nitroGLYCERIN (NITROSTAT) 0.4 MG SL tablet Place 1 tablet (0.4 mg total) under the tongue every 5 (five) minutes as needed for chest pain.  . rosuvastatin (CRESTOR) 20 MG tablet Take 20 mg by mouth daily.  Marland Kitchen telmisartan-hydrochlorothiazide (MICARDIS HCT) 40-12.5 MG tablet Take 1 tablet by mouth daily.  . ticagrelor (BRILINTA) 90 MG TABS tablet Take 1 tablet (90 mg total) by mouth 2 (two) times daily.  . vitamin B-12 (CYANOCOBALAMIN) 1000 MCG tablet Take 1,000 mcg by mouth daily.   Current Facility-Administered Medications for the 01/25/19 encounter (Telemedicine) with Baldo Daub, MD  Medication  . triamcinolone acetonide (KENALOG-40) injection 20 mg     Allergies:   Penicillins and Hydrocodeine [dihydrocodeine]   Social History   Tobacco Use  . Smoking status: Never Smoker  . Smokeless tobacco: Never Used  Substance Use Topics  . Alcohol use: No  . Drug use: No     Family Hx: The patient's family history includes Pancreatic cancer in his father; Stomach cancer in his mother.  ROS:   Please see the history of present illness.     All other systems reviewed and are negative.   Prior CV studies:   The following studies were reviewed today:    Labs/Other Tests and Data Reviewed:     Recent Labs: 11/12/2018: ALT 31; BUN 15; Creatinine, Ser 1.12; Hemoglobin 14.6; Platelets 208; Potassium 4.0; Sodium 139   Recent Lipid Panel 10/01/18;   Chol 109 LDL 63 HDL 36 TG 114  Wt Readings from Last 3 Encounters:  01/25/19 203 lb (92.1 kg)  12/28/18 206 lb (93.4 kg)  12/09/18 205 lb (93 kg)     Objective:    Vital Signs:  BP 104/69 (BP Location: Left Arm, Patient Position: Sitting)   Pulse 82   Ht 5\' 11"  (1.803 m)   Wt 203 lb (92.1 kg)   BMI 28.31 kg/m    VITAL SIGNS:   reviewed GEN:  no acute distress EYES:  sclerae anicteric, EOMI - Extraocular Movements Intact RESPIRATORY:  normal respiratory effort, symmetric expansion CARDIOVASCULAR:  no peripheral edema SKIN:  no rash, lesions or ulcers. MUSCULOSKELETAL:  no obvious deformities. NEURO:  alert and oriented x 3, no obvious focal deficit PSYCH:  normal affect  ASSESSMENT & PLAN:    1. Coronary artery disease stable he is engaged in cardiac rehabilitation is having no anginal discomfort on current medical regimen and will continue his current treatment including dual antiplatelet therapy for 1 year after PCI and his high intensity statin. 2. Hyperlipidemia stable his dose of rosuvastatin was increased with a residual LDL of 60 and a recent lipid profile cholesterol 126 LDL 60 HDL 47 he continue this treatment I do not think he will need combined therapy or PCSK9 inhibitor 3. Hypertension stable continue current treatment  with ARB diuretic 4. Bicuspid aortic valve with very mild aortic stenosis will need a repeat echocardiogram 1 to 2 years 5. Enlargement thoracic aorta mild we will need a follow-up CT scan in 1 to 2 years  COVID-19 Education: The signs and symptoms of COVID-19 were discussed with the patient and how to seek care for testing (follow up with PCP or arrange E-visit).  The importance of social distancing was discussed today.  Time:   Today, I have spent 20 minutes with the patient with telehealth technology discussing the above problems.     Medication Adjustments/Labs and Tests Ordered: Current medicines are reviewed at length with the patient today.  Concerns regarding medicines are outlined above.   Tests Ordered: No orders of the defined types were placed in this encounter.   Medication Changes: No orders of the defined types were placed in this encounter.   Disposition:  Follow up in 3 month(s)  Signed, Norman HerrlichBrian Munley, MD  01/25/2019 8:53 AM    [facility]  Heart[facility]

## 2019-01-25 ENCOUNTER — Other Ambulatory Visit: Payer: Self-pay

## 2019-01-25 ENCOUNTER — Encounter: Payer: Self-pay | Admitting: Cardiology

## 2019-01-25 ENCOUNTER — Telehealth (INDEPENDENT_AMBULATORY_CARE_PROVIDER_SITE_OTHER): Payer: Medicare Other | Admitting: Cardiology

## 2019-01-25 VITALS — BP 104/69 | HR 82 | Ht 71.0 in | Wt 203.0 lb

## 2019-01-25 DIAGNOSIS — I10 Essential (primary) hypertension: Secondary | ICD-10-CM

## 2019-01-25 DIAGNOSIS — I25118 Atherosclerotic heart disease of native coronary artery with other forms of angina pectoris: Secondary | ICD-10-CM | POA: Diagnosis not present

## 2019-01-25 DIAGNOSIS — I7789 Other specified disorders of arteries and arterioles: Secondary | ICD-10-CM

## 2019-01-25 DIAGNOSIS — I350 Nonrheumatic aortic (valve) stenosis: Secondary | ICD-10-CM

## 2019-01-25 DIAGNOSIS — E782 Mixed hyperlipidemia: Secondary | ICD-10-CM

## 2019-01-25 NOTE — Patient Instructions (Addendum)
Medication Instructions:  Your physician recommends that you continue on your current medications as directed. Please refer to the Current Medication list given to you today.  If you need a refill on your cardiac medications before your next appointment, please call your pharmacy.   Lab work: None  If you have labs (blood work) drawn today and your tests are completely normal, you will receive your results only by: Marland Kitchen MyChart Message (if you have MyChart) OR . A paper copy in the mail If you have any lab test that is abnormal or we need to change your treatment, we will call you to review the results.  Testing/Procedures: None  Follow-Up: At [facility], you and your health needs are our priority.  As part of our continuing mission to provide you with exceptional heart care, we have created designated Provider Care Teams.  These Care Teams include your primary Cardiologist (physician) and Advanced Practice Providers (APPs -  Physician Assistants and Nurse Practitioners) who all work together to provide you with the care you need, when you need it. You will need a follow up appointment in 3 months: Thursday, 04/29/2019, at 11:30 am in the [place] office.

## 2019-02-10 ENCOUNTER — Other Ambulatory Visit: Payer: Self-pay | Admitting: Cardiology

## 2019-02-10 MED ORDER — TICAGRELOR 90 MG PO TABS: 90.0000 mg | ORAL_TABLET | Freq: Two times a day (BID) | ORAL | 0 refills | Status: DC

## 2019-02-10 NOTE — Telephone Encounter (Signed)
Brilinta refill sent to [place] per request.

## 2019-02-10 NOTE — Telephone Encounter (Signed)
°*  STAT* If patient is at the pharmacy, call can be transferred to refill team.   1. Which medications need to be refilled? (please list name of each medication and dose if known) ticagrelor (BRILINTA) 90 MG TABS   2. Which pharmacy/location (including street and city if local pharmacy) is medication to be sent to?  [facility], [place] - [address] [phone] (Phone) [phone] (Fax)    3. Do they need a 30 day or 90 day supply? 90 day

## 2019-03-08 ENCOUNTER — Telehealth: Payer: Self-pay | Admitting: Cardiology

## 2019-03-08 DIAGNOSIS — I300 Acute nonspecific idiopathic pericarditis: Secondary | ICD-10-CM

## 2019-03-08 NOTE — Telephone Encounter (Signed)
Patient informed to come to the [place] office for lab work today, no appointment needed, no need to fast beforehand. Patient has been scheduled for an office visit on Wednesday, 03/10/2019, at 1:00 pm in the [place] office. Patient is agreeable and verbalized understanding. No further questions.

## 2019-03-08 NOTE — Addendum Note (Signed)
Addended by: Austin Miles on: 03/08/2019 11:02 AM   Modules accepted: Orders

## 2019-03-08 NOTE — Telephone Encounter (Signed)
Needs sed rate CRp and office Weds

## 2019-03-08 NOTE — Telephone Encounter (Signed)
Patient has called and states that he is getting the early onset of Pericarditis and wants to see what he may need to do, he would like to prevent this from happening.

## 2019-03-08 NOTE — Telephone Encounter (Signed)
Please advise. Thanks.  

## 2019-03-09 ENCOUNTER — Telehealth: Payer: Self-pay | Admitting: Cardiology

## 2019-03-09 LAB — SEDIMENTATION RATE: Sed Rate: 25 mm/hr (ref 0–30)

## 2019-03-09 LAB — HIGH SENSITIVITY CRP: CRP, High Sensitivity: 7.95 mg/L — ABNORMAL HIGH (ref 0.00–3.00)

## 2019-03-09 NOTE — Telephone Encounter (Signed)
Patient was called as reminder about appt tomorrow and reports having a fever of 99.8. Please advise whether he should cancel

## 2019-03-09 NOTE — Telephone Encounter (Signed)
Please advise 

## 2019-03-09 NOTE — Telephone Encounter (Signed)
Provided he is less than 100.4 we will see him in the office he appears to have recurrent pericarditis

## 2019-03-10 ENCOUNTER — Encounter: Payer: Self-pay | Admitting: Cardiology

## 2019-03-10 ENCOUNTER — Ambulatory Visit (INDEPENDENT_AMBULATORY_CARE_PROVIDER_SITE_OTHER): Payer: Medicare Other | Admitting: Cardiology

## 2019-03-10 ENCOUNTER — Other Ambulatory Visit: Payer: Self-pay

## 2019-03-10 VITALS — BP 114/78 | HR 83 | Temp 98.6°F | Ht 71.0 in | Wt 213.2 lb

## 2019-03-10 DIAGNOSIS — Q231 Congenital insufficiency of aortic valve: Secondary | ICD-10-CM

## 2019-03-10 DIAGNOSIS — I25118 Atherosclerotic heart disease of native coronary artery with other forms of angina pectoris: Secondary | ICD-10-CM | POA: Diagnosis not present

## 2019-03-10 DIAGNOSIS — I300 Acute nonspecific idiopathic pericarditis: Secondary | ICD-10-CM | POA: Diagnosis not present

## 2019-03-10 DIAGNOSIS — I7789 Other specified disorders of arteries and arterioles: Secondary | ICD-10-CM

## 2019-03-10 DIAGNOSIS — I10 Essential (primary) hypertension: Secondary | ICD-10-CM | POA: Diagnosis not present

## 2019-03-10 DIAGNOSIS — E782 Mixed hyperlipidemia: Secondary | ICD-10-CM | POA: Diagnosis not present

## 2019-03-10 MED ORDER — IBUPROFEN 800 MG PO TABS: 800.0000 mg | ORAL_TABLET | Freq: Three times a day (TID) | ORAL | 1 refills | Status: DC

## 2019-03-10 NOTE — Telephone Encounter (Signed)
Patient advised if his temperature is less than 100.4 then we will keep his office visit as scheduled this morning at 10:40 am. Patient verbalized understanding. No further questions.

## 2019-03-10 NOTE — Patient Instructions (Signed)
Medication Instructions:  Your physician has recommended you make the following change in your medication:  INCREASE: Ibuprofen 800 mg : Take 1 tab three times daily  If you need a refill on your cardiac medications before your next appointment, please call your pharmacy.   Lab work: Your physician recommends that you return for lab work in: 1 week CRP,Sed Rate,CBC  l  If you have labs (blood work) drawn today and your tests are completely normal, you will receive your results only by: Marland Kitchen MyChart Message (if you have MyChart) OR . A paper copy in the mail If you have any lab test that is abnormal or we need to change your treatment, we will call you to review the .  Testing/Procedures: None  Follow-Up: At [facility], you and your health needs are our priority.  As part of our continuing mission to provide you with exceptional heart care, we have created designated Provider Care Teams.  These Care Teams include your primary Cardiologist (physician) and Advanced Practice Providers (APPs -  Physician Assistants and Nurse Practitioners) who all work together to provide you with the care you need, when you need it. You will need a follow up appointment in 2 weeks.  Please call our office 2 months in advance to schedule this appointment.  You may see Shirlee More, MD or another member of our [place] Provider Team in [place]: Jenne Campus, MD . Jyl Heinz, MD  Any Other Special Instructions Will Be Listed Below (If Applicable).  See DR. Dough for COVID 19 test

## 2019-03-10 NOTE — Progress Notes (Signed)
Cardiology Office Note:    Date:  03/10/2019   ID:  Eugene Taylor Kutzer, DOB 10/24/1950, MRN [id number]  PCP:  Olive Bassough, Robert L, MD  Cardiologist:  Norman HerrlichBrian Charde Macfarlane, MD    Referring MD: Olive Bassough, Robert L, MD    ASSESSMENT:    1. Acute idiopathic pericarditis   2. Coronary artery disease of native artery of native heart with stable angina pectoris (HCC)   3. Mixed hyperlipidemia   4. Essential hypertension   5. Bicuspid aortic valve   6. Enlarged thoracic aorta (HCC)    PLAN:    In order of problems listed above:  1. Pericarditis  -he is having recurrent relapsing pericarditis fever typical symptoms EKG and C-reactive protein he is back on colchicine twice daily I will keep him on ibuprofen 800 mg three times daily 1 week recheck C-reactive protein and sedimentation rate see me in 2 weeks.  Long-term I will keep him on low-dose colchicine and will have a strategy for ibuprofen if needed in the future. 2. CAD - S/p cath and stent to LCx 10/2018. DAPT: Aspirin and brilinta recommended for 1 year. GDMT: aspirin, statin.  Stable CAD stop aspirin due to NSAID for pericarditis.  3. HLD - Crestor dose increased at time of cath 10/2018. LDL 60 01/14/19 at goal of <40<70.  4. HTN -stable continue current treatment 5. Bicuspid AV - Mild aortic stenosis by echo 04/2018. Consider repeat echo in 1-2 years.  6. Enlarged thoracic aorta - stable recent imaging likely repeat in 1 year   Next appointment: 2 weeks   Medication Adjustments/Labs and Tests Ordered: Current medicines are reviewed at length with the patient today.  Concerns regarding medicines are outlined above.  Orders Placed This Encounter  Procedures  . CBC  . C-reactive protein  . Sedimentation rate  . EKG 12-Lead   Meds ordered this encounter  Medications  . ibuprofen (ADVIL) 800 MG tablet    Sig: Take 1 tablet (800 mg total) by mouth 3 (three) times daily.    Dispense:  90 tablet    Refill:  1    Chief Complaint: 68 yo male presents  today for concern of possible recurrent pericarditis.   History of Present Illness:    Eugene Taylor Knighton is a 68 y.o. male with a hx of recurrent idiopathic pericarditis, bicuspid aortic valve with mild stenosis and mid regurgitation, mild enlargement of ascending thoracic aorta 40mm in Oct 2019, HTN, CCAD with PCI and stent of proximal LFx 11/11/18 last seen 01/25/19.   He called our office with concern of recurrent pericarditis. Labs were ordered with results 03/08/19 of CRP 7.95 and sed rate 25.   Compliance with diet, lifestyle and medications: Yes  Sunday the onset of recurrent pleuropericardial pain he has had low-grade fever up to 100.8 he has had minimal cough no sputum not short of breath.  Pain is typical pericardial its pleuritic and is alleviated with leaning forward worse supine.  He has not felt well.  I called him last night when I saw a C-reactive protein was markedly elevated sedimentation rate was high normal but double previous and start him on colchicine twice daily and ibuprofen 800 mg.  He is improved he has very little pain today he feels better he is not short of breath.  Physical examination shows a 2 component pericardial friction rub and he has the typical subtle ST segment elevation diffusely consistent with pericarditis his chest pain is not anginal. Past Medical History:  Diagnosis Date  .  Acute idiopathic pericarditis 04/25/2016   Overview:  2017: hosp, EKG changes, colchicine/indomethacin  . Acute medial meniscal tear 12/15/2014  . Angina pectoris (HCC) 05/05/2018  . Arthritis    LEFT SHOULDER  . Asthma   . Benign hypertension 11/27/2015  . Complication of anesthesia    " during a knee surgery , my bllod pressure was up & stayed up "  . Coronary artery disease   . External hemorrhoid, thrombosed 12/28/2015   Overview:  2017:   . History of adenomatous polyp of colon   . History of kidney stones   . Hyperlipidemia   . Hypertension   . Lower respiratory infection  02/08/2016  . Mixed hyperlipidemia 11/27/2015  . Obstructive sleep apnea 11/27/2015  . OSA on CPAP    uses cpap  . Right knee meniscal tear   . Screening for diabetes mellitus (DM) 11/27/2015  . Wears glasses     Past Surgical History:  Procedure Laterality Date  . APPENDECTOMY  age 68  . CARDIAC CATHETERIZATION  11/11/2018  . CHONDROPLASTY Right 12/15/2014   Procedure: CHONDROPLASTY;  Surgeon: Ollen GrossFrank Aluisio, MD;  [facility] [place];  Service: Orthopedics;  Laterality: Right;  . COLONOSCOPY W/ POLYPECTOMY  2011  . CORONARY STENT INTERVENTION  11/11/2018  . CORONARY STENT INTERVENTION N/A 11/11/2018   Procedure: CORONARY STENT INTERVENTION;  Surgeon: Lennette BihariKelly, Thomas A, MD;  [facility];  Service: Cardiovascular;  Laterality: N/A;  . CYSTO/  URETEROSCOPIC STONE EXTRACTIONS  1995  . KNEE ARTHROSCOPY WITH LATERAL MENISECTOMY Right 12/15/2014   Procedure: KNEE ARTHROSCOPY WITH LATERAL MENISECTOMY;  Surgeon: Ollen GrossFrank Aluisio, MD;  [facility] [place];  Service: Orthopedics;  Laterality: Right;  . KNEE ARTHROSCOPY WITH MEDIAL MENISECTOMY Right 12/15/2014   Procedure: KNEE ARTHROSCOPY WITH MEDIAL MENISECTOMY;  Surgeon: Ollen GrossFrank Aluisio, MD;  [facility] [place];  Service: Orthopedics;  Laterality: Right;  . LEFT HEART CATH AND CORONARY ANGIOGRAPHY N/A 11/11/2018   Procedure: LEFT HEART CATH AND CORONARY ANGIOGRAPHY;  Surgeon: Lennette BihariKelly, Thomas A, MD;  Location: [facility] CV [facility];  Service: Cardiovascular;  Laterality: N/A;  . NASAL SEPTUM SURGERY  1995    Current Medications: Current Meds  Medication Sig  . cholecalciferol (VITAMIN D3) 25 MCG (1000 UT) tablet Take 1,000 Units by mouth daily.  . clindamycin (CLEOCIN) 300 MG capsule Take 2 capsules (600 mg total) by mouth as directed. Take 2 capsules (600mg ) 45 minutes prior to dental visits  . colchicine 0.6 MG tablet Take 0.6 mg by mouth daily.  Marland Kitchen. EPIPEN 2-PAK 0.3 MG/0.3ML SOAJ injection  Inject 0.3 mg into the muscle as needed for anaphylaxis (red meat).   Marland Kitchen. ibuprofen (ADVIL) 800 MG tablet Take 1 tablet (800 mg total) by mouth 3 (three) times daily.  . nitroGLYCERIN (NITROSTAT) 0.4 MG SL tablet Place 1 tablet (0.4 mg total) under the tongue every 5 (five) minutes as needed for chest pain.  . rosuvastatin (CRESTOR) 20 MG tablet Take 20 mg by mouth daily.  Marland Kitchen. telmisartan-hydrochlorothiazide (MICARDIS HCT) 40-12.5 MG tablet Take 1 tablet by mouth daily.  . ticagrelor (BRILINTA) 90 MG TABS tablet Take 1 tablet (90 mg total) by mouth 2 (two) times daily.  . vitamin B-12 (CYANOCOBALAMIN) 1000 MCG tablet Take 1,000 mcg by mouth daily.  . [DISCONTINUED] aspirin EC 81 MG tablet Take 1 tablet (81 mg total) by mouth daily.  . [DISCONTINUED] ibuprofen (ADVIL) 800 MG tablet Take 800 mg by mouth 3 (three) times daily.   Current  Facility-Administered Medications for the 03/10/19 encounter (Office Visit) with Richardo Priest, MD  Medication  . triamcinolone acetonide (KENALOG-40) injection 20 mg     Allergies:   Penicillins and Hydrocodeine [dihydrocodeine]   Social History   Socioeconomic History  . Marital status: Married    Spouse name: Not on file  . Number of children: Not on file  . Years of education: Not on file  . Highest education level: Not on file  Occupational History  . Not on file  Social Needs  . Financial resource strain: Not on file  . Food insecurity    Worry: Not on file    Inability: Not on file  . Transportation needs    Medical: Not on file    Non-medical: Not on file  Tobacco Use  . Smoking status: Never Smoker  . Smokeless tobacco: Never Used  Substance and Sexual Activity  . Alcohol use: No  . Drug use: No  . Sexual activity: Not on file  Lifestyle  . Physical activity    Days per week: Not on file    Minutes per session: Not on file  . Stress: Not on file  Relationships  . Social Herbalist on phone: Not on file    Gets together:  Not on file    Attends religious service: Not on file    Active member of club or organization: Not on file    Attends meetings of clubs or organizations: Not on file    Relationship status: Not on file  Other Topics Concern  . Not on file  Social History Narrative  . Not on file     Family History: The patient's family history includes Pancreatic cancer in his father; Stomach cancer in his mother. ROS:   Please see the history of present illness.    Review of Systems  Constitution: Positive for fever ("low grade 99.8") and malaise/fatigue. Negative for chills.  Cardiovascular: Positive for chest pain. Negative for dyspnea on exertion, leg swelling and orthopnea.  Respiratory: Positive for cough. Negative for hemoptysis, shortness of breath and wheezing.   Gastrointestinal: Negative for nausea and vomiting.  Neurological: Negative for dizziness, light-headedness and numbness.    All other systems reviewed and are negative.  EKGs/Labs/Other Studies Reviewed:    The following studies were reviewed today:  Cardiac cath 11/11/18  Prox RCA lesion is 20% stenosed.  Post Atrio lesion is 20% stenosed.  Ost Cx lesion is 35% stenosed.  Prox Cx to Mid Cx lesion is 85% stenosed.  Ost 2nd Mrg to 2nd Mrg lesion is 80% stenosed.  1st Diag lesion is 50% stenosed.  Ost LAD to Prox LAD lesion is 20% stenosed.  Prox LAD lesion is 55% stenosed.  Prox LAD to Mid LAD lesion is 30% stenosed.  Dist LAD lesion is 80% stenosed.  Post intervention, there is a 0% residual stenosis.  A stent was successfully placed.   Evidence for multivessel coronary calcification with most prominent calcification in the proximal to mid LAD.     The LAD has smooth 20% proximal narrowing.  The first diagonal vessel has a superior branch that has mid narrowing of 50%.  Beyond the first diagonal vessel the LAD has calcification proximally and inferiorly and probable 50 to 60% narrowing followed by 30%  irregularity to the mid segment.  The apical portion of the LAD has 80% stenosis.   The left circumflex has 30-40% focal ostial stenosis and has an eccentric 85% stenosis  after a small left atrial circumflex branch and before a very small second marginal vessel.  The second marginal vessel has ostial proximal narrowing of 80%.   The RCA is a dominant vessel with mild calcification with 20% proximal narrowing and 20% smooth narrowing in the proximal PLA.   Successful PCI with a Wolverine cutting balloon predilatation and ultimate DES stenting with a 2.5 x 18 mm Resolute Onyx stent postdilated to approximately 2.5 mm with the 85% stenosis being reduced to 0% and brisk TIMI-3 flow.  RECOMMENDATION: The patient has been on telmisartan HCT for hypertension and was just given a prescription to start rosuvastatin 10 mg.  Since he is never had any edema, consider discontinuing telmisartan HCT and consider possible therapy with amlodipine and beta-blocker therapy for anti-ischemic benefit and possible low dose ARB if additional BP control is needed.  Recommend dual antiplatelet therapy and with significant concomitant CAD for at least 1 year.  Will increase rosuvastatin to 40 mg for optimal LDL management and if unable to reach a target less than 70 consider adding Zetia 10 mg or possible PCSK9 inhibition.  If patient develops recurrent symptomatology on anti-ischemic medication, consider intervention to the mid LAD with possible atherectomy due to superior and inferior calcification.   EKG:  EKG ordered today and personally reviewed.  The ekg ordered today demonstrates  subtle ST segment elevation diffusely consistent with pericarditis   Recent Labs: 11/12/2018: ALT 31; BUN 15; Creatinine, Ser 1.12; Hemoglobin 14.6; Platelets 208; Potassium 4.0; Sodium 139  01/14/19 via [facility]: A1c 5.9,  Recent Lipid Panel 01/14/19 via [facility]: Total 126, HDL 47, LDL 60, Triglycerides 140  Physical Exam:    VS:  BP 114/78 (BP  Location: Right Arm, Patient Position: Sitting, Cuff Size: Normal)   Pulse 83   Temp 98.6 F (37 C)   Ht 5\' 11"  (1.803 m)   Wt 213 lb 3.2 oz (96.7 kg)   SpO2 96%   BMI 29.74 kg/m     Wt Readings from Last 3 Encounters:  03/10/19 213 lb 3.2 oz (96.7 kg)  01/25/19 203 lb (92.1 kg)  12/28/18 206 lb (93.4 kg)     GEN:  Well nourished, well developed in no acute distress HEENT: Normal NECK: No JVD; No carotid bruits LYMPHATICS: No lymphadenopathy CARDIAC: He has a 2 component pericardial friction rub RRR, no murmurs, rubs, gallops RESPIRATORY:  Clear to auscultation without rales, wheezing or rhonchi  ABDOMEN: Soft, non-tender, non-distended MUSCULOSKELETAL:  No edema; No deformity  SKIN: Warm and dry NEUROLOGIC:  Alert and oriented x 3 PSYCHIATRIC:  Normal affect    Signed, Norman HerrlichBrian Taggert Bozzi, MD  03/10/2019 11:21 AM    [facility]

## 2019-03-23 ENCOUNTER — Telehealth: Payer: Self-pay | Admitting: *Deleted

## 2019-03-23 DIAGNOSIS — I300 Acute nonspecific idiopathic pericarditis: Secondary | ICD-10-CM

## 2019-03-23 LAB — CBC
Hematocrit: 42 % (ref 37.5–51.0)
Hemoglobin: 13.7 g/dL (ref 13.0–17.7)
MCH: 29.1 pg (ref 26.6–33.0)
MCHC: 32.6 g/dL (ref 31.5–35.7)
MCV: 89 fL (ref 79–97)
Platelets: 295 10*3/uL (ref 150–450)
RBC: 4.71 x10E6/uL (ref 4.14–5.80)
RDW: 13.5 % (ref 11.6–15.4)
WBC: 5.1 10*3/uL (ref 3.4–10.8)

## 2019-03-23 LAB — C-REACTIVE PROTEIN: CRP: 1 mg/L (ref 0–10)

## 2019-03-23 LAB — SEDIMENTATION RATE: Sed Rate: 33 mm/hr — ABNORMAL HIGH (ref 0–30)

## 2019-03-23 MED ORDER — IBUPROFEN 800 MG PO TABS: 400.0000 mg | ORAL_TABLET | Freq: Three times a day (TID) | ORAL | 1 refills | Status: DC

## 2019-03-23 NOTE — Progress Notes (Signed)
Cardiology Office Note:    Date:  03/24/2019   ID:  Eugene Taylor, DOB 07/02/1951, MRN [id number]  PCP:  Olive Bassough, Robert L, MD  Cardiologist:  Norman HerrlichBrian Dorethea Strubel, MD    Referring MD: Olive Bassough, Robert L, MD    ASSESSMENT:    1. Recurrent idiopathic pericarditis    PLAN:    In order of problems listed above:  1. Improved will remain on ibuprofen and colchicine recheck CRP and sed rate in 2 weeks and refer to rheumatology.  His hypertension hyperlipidemia bicuspid aortic valve CAD stable continue those medical treatments and I will plan to see back in my office in 3 months or sooner   Next appointment: 3 months   Medication Adjustments/Labs and Tests Ordered: Current medicines are reviewed at length with the patient today.  Concerns regarding medicines are outlined above.  Orders Placed This Encounter  Procedures  . Ambulatory referral to Rheumatology   No orders of the defined types were placed in this encounter.   Chief Complaint  Patient presents with  . Follow-up    History of Present Illness:    Eugene Taylor is a 68 y.o. male with a hx of recurrent idiopathic pericarditis, bicuspid aortic valve with mild stenosis and mid regurgitation, mild enlargement of ascending thoracic aorta 40mm in Oct 2019, HTN, CCAD with PCI and stent of proximal LFx 11/11/18 last seen  03/10/2019 with pericarditis recurrence.  Previous evaluation with antinuclear antibody and complement levels was normal. Compliance with diet, lifestyle and medications: Yes  He has had no recurrence of pericardial pain overall he feels well but we have held on cardiac rehabilitation with recurrent pericarditis.  He is having some arthritic symptoms and after a nice discussion shared decision making will undergo referral to rheumatology in view of his recurrent pericarditis and joint symptoms he has no obvious evidence of lupus or other rheumatologic diseases at this time.  He tolerates colchicine will remain on a  long-term along with his nonsteroidal anti-inflammatory drug.  Both of us will do our best to avoid steroid therapy.  He has had no angina edema shortness of breath chest pain palpitation or syncope and no fever chills Past Medical History:  Diagnosis Date  . Acute idiopathic pericarditis 04/25/2016   Overview:  2017: hosp, EKG changes, colchicine/indomethacin  . Acute medial meniscal tear 12/15/2014  . Angina pectoris (HCC) 05/05/2018  . Arthritis    LEFT SHOULDER  . Asthma   . Benign hypertension 11/27/2015  . Complication of anesthesia    " during a knee surgery , my bllod pressure was up & stayed up "  . Coronary artery disease   . External hemorrhoid, thrombosed 12/28/2015   Overview:  2017:   . History of adenomatous polyp of colon   . History of kidney stones   . Hyperlipidemia   . Hypertension   . Lower respiratory infection 02/08/2016  . Mixed hyperlipidemia 11/27/2015  . Obstructive sleep apnea 11/27/2015  . OSA on CPAP    uses cpap  . Right knee meniscal tear   . Screening for diabetes mellitus (DM) 11/27/2015  . Wears glasses     Past Surgical History:  Procedure Laterality Date  . APPENDECTOMY  age 68  . CARDIAC CATHETERIZATION  11/11/2018  . CHONDROPLASTY Right 12/15/2014   Procedure: CHONDROPLASTY;  Surgeon: Ollen GrossFrank Aluisio, MD;  [facility] [place];  Service: Orthopedics;  Laterality: Right;  . COLONOSCOPY W/ POLYPECTOMY  2011  . CORONARY STENT INTERVENTION  11/11/2018  .  CORONARY STENT INTERVENTION N/A 11/11/2018   Procedure: CORONARY STENT INTERVENTION;  Surgeon: Lennette BihariKelly, Thomas A, MD;  [facility];  Service: Cardiovascular;  Laterality: N/A;  . CYSTO/  URETEROSCOPIC STONE EXTRACTIONS  1995  . KNEE ARTHROSCOPY WITH LATERAL MENISECTOMY Right 12/15/2014   Procedure: KNEE ARTHROSCOPY WITH LATERAL MENISECTOMY;  Surgeon: Ollen GrossFrank Aluisio, MD;  [facility] [place];  Service: Orthopedics;  Laterality: Right;  . KNEE ARTHROSCOPY WITH  MEDIAL MENISECTOMY Right 12/15/2014   Procedure: KNEE ARTHROSCOPY WITH MEDIAL MENISECTOMY;  Surgeon: Ollen GrossFrank Aluisio, MD;  [facility] [place];  Service: Orthopedics;  Laterality: Right;  . LEFT HEART CATH AND CORONARY ANGIOGRAPHY N/A 11/11/2018   Procedure: LEFT HEART CATH AND CORONARY ANGIOGRAPHY;  Surgeon: Lennette BihariKelly, Thomas A, MD;  Location: [facility] CV LAB;  Service: Cardiovascular;  Laterality: N/A;  . NASAL SEPTUM SURGERY  1995    Current Medications: Current Meds  Medication Sig  . cholecalciferol (VITAMIN D3) 25 MCG (1000 UT) tablet Take 1,000 Units by mouth daily.  . clindamycin (CLEOCIN) 300 MG capsule Take 2 capsules (600 mg total) by mouth as directed. Take 2 capsules (600mg ) 45 minutes prior to dental visits  . colchicine 0.6 MG tablet Take 0.6 mg by mouth daily.  Marland Kitchen. EPIPEN 2-PAK 0.3 MG/0.3ML SOAJ injection Inject 0.3 mg into the muscle as needed for anaphylaxis (red meat).   Marland Kitchen. ibuprofen (ADVIL) 800 MG tablet Take 0.5 tablets (400 mg total) by mouth 3 (three) times daily.  . nitroGLYCERIN (NITROSTAT) 0.4 MG SL tablet Place 1 tablet (0.4 mg total) under the tongue every 5 (five) minutes as needed for chest pain.  . rosuvastatin (CRESTOR) 20 MG tablet Take 20 mg by mouth daily.  Marland Kitchen. telmisartan-hydrochlorothiazide (MICARDIS HCT) 40-12.5 MG tablet Take 1 tablet by mouth daily.  . ticagrelor (BRILINTA) 90 MG TABS tablet Take 1 tablet (90 mg total) by mouth 2 (two) times daily.  . vitamin B-12 (CYANOCOBALAMIN) 1000 MCG tablet Take 1,000 mcg by mouth daily.   Current Facility-Administered Medications for the 03/24/19 encounter (Office Visit) with Baldo DaubMunley, Charis Juliana J, MD  Medication  . triamcinolone acetonide (KENALOG-40) injection 20 mg     Allergies:   Penicillins and Hydrocodeine [dihydrocodeine]   Social History   Socioeconomic History  . Marital status: Married    Spouse name: Not on file  . Number of children: Not on file  . Years of education: Not on file  .  Highest education level: Not on file  Occupational History  . Not on file  Social Needs  . Financial resource strain: Not on file  . Food insecurity    Worry: Not on file    Inability: Not on file  . Transportation needs    Medical: Not on file    Non-medical: Not on file  Tobacco Use  . Smoking status: Never Smoker  . Smokeless tobacco: Never Used  Substance and Sexual Activity  . Alcohol use: No  . Drug use: No  . Sexual activity: Not on file  Lifestyle  . Physical activity    Days per week: Not on file    Minutes per session: Not on file  . Stress: Not on file  Relationships  . Social Musicianconnections    Talks on phone: Not on file    Gets together: Not on file    Attends religious service: Not on file    Active member of club or organization: Not on file    Attends meetings of clubs or  organizations: Not on file    Relationship status: Not on file  Other Topics Concern  . Not on file  Social History Narrative  . Not on file     Family History: The patient's family history includes Pancreatic cancer in his father; Stomach cancer in his mother. ROS:   Please see the history of present illness.    All other systems reviewed and are negative.  EKGs/Labs/Other Studies Reviewed:    The following studies were reviewed today:  Recent Labs:   Ref Range & Units 2d ago 2wk ago 8mo ago  Sed Rate 0 - 30 mm/hr 33High   25  14     11/12/2018: ALT 31; BUN 15; Creatinine, Ser 1.12; Potassium 4.0; Sodium 139 03/22/2019: Hemoglobin 13.7; Platelets 295  Recent Lipid Panel No results found for: CHOL, TRIG, HDL, CHOLHDL, VLDL, LDLCALC, LDLDIRECT  Physical Exam:    VS:  BP 122/78 (BP Location: Right Arm, Patient Position: Sitting, Cuff Size: Normal)   Pulse 68   Ht 5\' 11"  (1.803 m)   Wt 206 lb (93.4 kg)   BMI 28.73 kg/m     Wt Readings from Last 3 Encounters:  03/24/19 206 lb (93.4 kg)  03/10/19 213 lb 3.2 oz (96.7 kg)  01/25/19 203 lb (92.1 kg)     GEN:  Well  nourished, well developed in no acute distress HEENT: Normal NECK: No JVD; No carotid bruits LYMPHATICS: No lymphadenopathy CARDIAC: No rub on exam today RRR, no murmurs, rubs, gallops RESPIRATORY:  Clear to auscultation without rales, wheezing or rhonchi  ABDOMEN: Soft, non-tender, non-distended MUSCULOSKELETAL:  No edema; No deformity  SKIN: Warm and dry NEUROLOGIC:  Alert and oriented x 3 PSYCHIATRIC:  Normal affect    Signed, Shirlee More, MD  03/24/2019 12:16 PM    [facility]

## 2019-03-23 NOTE — Telephone Encounter (Signed)
Patient informed of results. Advised him to decrease colchicine from twice to once daily and decrease ibuprofen from 800 mg to 400 mg three times daily. Patient will return to our office in 2 weeks for repeat lab work, no appointment needed. Patient verbalized understanding. No further questions.

## 2019-03-23 NOTE — Telephone Encounter (Signed)
-----   Message from Richardo Priest, MD sent at 03/23/2019 10:15 AM EDT ----- Reduce colchicine to once day and ibuprofen to 400 tid recheck same 2 weeks

## 2019-03-24 ENCOUNTER — Other Ambulatory Visit: Payer: Self-pay

## 2019-03-24 ENCOUNTER — Ambulatory Visit (INDEPENDENT_AMBULATORY_CARE_PROVIDER_SITE_OTHER): Payer: Medicare Other | Admitting: Cardiology

## 2019-03-24 ENCOUNTER — Encounter: Payer: Self-pay | Admitting: Cardiology

## 2019-03-24 VITALS — BP 122/78 | HR 68 | Ht 71.0 in | Wt 206.0 lb

## 2019-03-24 DIAGNOSIS — I300 Acute nonspecific idiopathic pericarditis: Secondary | ICD-10-CM | POA: Diagnosis not present

## 2019-03-24 NOTE — Patient Instructions (Signed)
Medication Instructions:  Your physician recommends that you continue on your current medications as directed. Please refer to the Current Medication list given to you today.  If you need a refill on your cardiac medications before your next appointment, please call your pharmacy.   Lab work: Your physician recommends that you return for lab work in 2 weeks: CBC, sedimentation rate, c-reactive protein.   If you have labs (blood work) drawn today and your tests are completely normal, you will receive your results only by: Marland Kitchen MyChart Message (if you have MyChart) OR . A paper copy in the mail If you have any lab test that is abnormal or we need to change your treatment, we will call you to review the results.  Testing/Procedures: You have been referred to see a rheumatologist, Dr. Estanislado Pandy, due to recurrent pericarditis. You will be contacted to schedule an appointment.    Follow-Up: At [facility], you and your health needs are our priority.  As part of our continuing mission to provide you with exceptional heart care, we have created designated Provider Care Teams.  These Care Teams include your primary Cardiologist (physician) and Advanced Practice Providers (APPs -  Physician Assistants and Nurse Practitioners) who all work together to provide you with the care you need, when you need it. You will need a follow up appointment in 4 weeks.

## 2019-04-09 LAB — CBC
Hematocrit: 42.9 % (ref 37.5–51.0)
Hemoglobin: 14.4 g/dL (ref 13.0–17.7)
MCH: 29.1 pg (ref 26.6–33.0)
MCHC: 33.6 g/dL (ref 31.5–35.7)
MCV: 87 fL (ref 79–97)
Platelets: 220 10*3/uL (ref 150–450)
RBC: 4.95 x10E6/uL (ref 4.14–5.80)
RDW: 13.8 % (ref 11.6–15.4)
WBC: 6.8 10*3/uL (ref 3.4–10.8)

## 2019-04-09 LAB — SEDIMENTATION RATE: Sed Rate: 25 mm/hr (ref 0–30)

## 2019-04-09 LAB — C-REACTIVE PROTEIN: CRP: <1 mg/L (ref 0–10)

## 2019-04-14 ENCOUNTER — Other Ambulatory Visit: Payer: Self-pay

## 2019-04-14 ENCOUNTER — Telehealth: Payer: Self-pay | Admitting: Cardiology

## 2019-04-14 MED ORDER — TICAGRELOR 90 MG PO TABS: 90.0000 mg | ORAL_TABLET | Freq: Two times a day (BID) | ORAL | 0 refills | Status: DC

## 2019-04-14 NOTE — Telephone Encounter (Signed)
Refill for brilinta sent to [facility] in [place] as requested.

## 2019-04-14 NOTE — Telephone Encounter (Signed)
Patient is changing to pharmacies to [facility] on [address] in [place], [place], [place] has gone out of business.  He also needs a refill sent ti the [facility] as follows:   *STAT* If patient is at the pharmacy, call can be transferred to refill team.   1. Which medications need to be refilled? (please list name of each medication and dose if known) ticagrelor (BRILINTA) 90 MG TABS   2. Which pharmacy/location (including street and city if local pharmacy) is medication to be sent to? [facility] in [place] 3. Do they need a 30 day or 90 day supply? 90 day

## 2019-04-19 ENCOUNTER — Other Ambulatory Visit: Payer: Self-pay | Admitting: Cardiology

## 2019-04-20 NOTE — Progress Notes (Signed)
Cardiology Office Note:    Date:  04/21/2019   ID:  Eugene MeierRicky D Taylor, DOB 02/21/1951, MRN [id number]  PCP:  Eugene Bassough, Robert L, MD  Cardiologist:  Eugene HerrlichBrian Munley, MD    Referring MD: Eugene Bassough, Robert L, MD    ASSESSMENT:    1. Recurrent idiopathic pericarditis   2. Coronary artery disease of native artery of native heart with stable angina pectoris (HCC)   3. Essential hypertension   4. Mixed hyperlipidemia    PLAN:    In order of problems listed above:  1. Stable recheck sed rate CRP reduce his ibuprofen 200 mg twice daily continue colchicine recheck in 3 months.  He did not schedule appointment with rheumatology 2. Stable CAD after PCI and stent continue his Brilinta and lipid-lowering treatment.  New York Heart Association class I having no angina 3. Stable hypertension continue his ARB thiazide diuretic 4. Continue with statin will check a CMP and lipid profile today   Next appointment: 3 months   Medication Adjustments/Labs and Tests Ordered: Current medicines are reviewed at length with the patient today.  Concerns regarding medicines are outlined above.  No orders of the defined types were placed in this encounter.  No orders of the defined types were placed in this encounter.   Chief Complaint  Patient presents with  . Follow-up    pericarditis    History of Present Illness:    Eugene Taylor is a 68 y.o. male with a hx of recurrent idiopathic pericarditis, bicuspid aortic valve with mild stenosis and mid regurgitation, mild enlargement of ascending thoracic aorta 40mm in Oct 2019, HTN, CCAD with PCI and stent of proximal LFx 11/11/18 seen  03/10/2019 with pericarditis recurrence.  Previous evaluation with antinuclear antibody and complement levels was normal.   last seen 03/24/2019. Compliance with diet, lifestyle and medications: Yes he has had no recurrent pericardial pain Compliant with medications no muscle symptoms from statin no angina dyspnea palpitation or  syncope Past Medical History:  Diagnosis Date  . Acute idiopathic pericarditis 04/25/2016   Overview:  2017: hosp, EKG changes, colchicine/indomethacin  . Acute medial meniscal tear 12/15/2014  . Angina pectoris (HCC) 05/05/2018  . Arthritis    LEFT SHOULDER  . Asthma   . Benign hypertension 11/27/2015  . Complication of anesthesia    " during a knee surgery , my bllod pressure was up & stayed up "  . Coronary artery disease   . External hemorrhoid, thrombosed 12/28/2015   Overview:  2017:   . History of adenomatous polyp of colon   . History of kidney stones   . Hyperlipidemia   . Hypertension   . Lower respiratory infection 02/08/2016  . Mixed hyperlipidemia 11/27/2015  . Obstructive sleep apnea 11/27/2015  . OSA on CPAP    uses cpap  . Right knee meniscal tear   . Screening for diabetes mellitus (DM) 11/27/2015  . Wears glasses     Past Surgical History:  Procedure Laterality Date  . APPENDECTOMY  age 89  . CARDIAC CATHETERIZATION  11/11/2018  . CHONDROPLASTY Right 12/15/2014   Procedure: CHONDROPLASTY;  Surgeon: Ollen GrossFrank Aluisio, MD;  [facility];  Service: Orthopedics;  Laterality: Right;  . COLONOSCOPY W/ POLYPECTOMY  2011  . CORONARY STENT INTERVENTION  11/11/2018  . CORONARY STENT INTERVENTION N/A 11/11/2018   Procedure: CORONARY STENT INTERVENTION;  Surgeon: Lennette BihariKelly, Thomas A, MD;  [facility];  Service: Cardiovascular;  Laterality: N/A;  . CYSTO/  URETEROSCOPIC STONE  EXTRACTIONS  1995  . KNEE ARTHROSCOPY WITH LATERAL MENISECTOMY Right 12/15/2014   Procedure: KNEE ARTHROSCOPY WITH LATERAL MENISECTOMY;  Surgeon: Ollen Gross, MD;  Location: [facility];  Service: Orthopedics;  Laterality: Right;  . KNEE ARTHROSCOPY WITH MEDIAL MENISECTOMY Right 12/15/2014   Procedure: KNEE ARTHROSCOPY WITH MEDIAL MENISECTOMY;  Surgeon: Ollen Gross, MD;  Location: [facility];  Service: Orthopedics;  Laterality: Right;  . LEFT  HEART CATH AND CORONARY ANGIOGRAPHY N/A 11/11/2018   Procedure: LEFT HEART CATH AND CORONARY ANGIOGRAPHY;  Surgeon: Lennette Bihari, MD;  [facility];  Service: Cardiovascular;  Laterality: N/A;  . NASAL SEPTUM SURGERY  1995    Current Medications: Current Meds  Medication Sig  . BRILINTA 90 MG TABS tablet TAKE 1 TABLET BY MOUTH TWICE DAILY.  . cholecalciferol (VITAMIN D3) 25 MCG (1000 UT) tablet Take 1,000 Units by mouth daily.  . clindamycin (CLEOCIN) 300 MG capsule Take 2 capsules (600 mg total) by mouth as directed. Take 2 capsules (600mg ) 45 minutes prior to dental visits  . colchicine 0.6 MG tablet Take 0.6 mg by mouth daily.  Marland Kitchen EPIPEN 2-PAK 0.3 MG/0.3ML SOAJ injection Inject 0.3 mg into the muscle as needed for anaphylaxis (red meat).   Marland Kitchen ibuprofen (ADVIL) 800 MG tablet Take 0.5 tablets (400 mg total) by mouth 3 (three) times daily.  . nitroGLYCERIN (NITROSTAT) 0.4 MG SL tablet Place 1 tablet (0.4 mg total) under the tongue every 5 (five) minutes as needed for chest pain.  . rosuvastatin (CRESTOR) 20 MG tablet Take 20 mg by mouth daily.  Marland Kitchen telmisartan-hydrochlorothiazide (MICARDIS HCT) 40-12.5 MG tablet Take 1 tablet by mouth daily.  . vitamin B-12 (CYANOCOBALAMIN) 1000 MCG tablet Take 1,000 mcg by mouth daily.   Current Facility-Administered Medications for the 04/21/19 encounter (Office Visit) with Baldo Daub, MD  Medication  . triamcinolone acetonide (KENALOG-40) injection 20 mg     Allergies:   Penicillins and Hydrocodeine [dihydrocodeine]   Social History   Socioeconomic History  . Marital status: Married    Spouse name: Not on file  . Number of children: Not on file  . Years of education: Not on file  . Highest education level: Not on file  Occupational History  . Not on file  Social Needs  . Financial resource strain: Not on file  . Food insecurity    Worry: Not on file    Inability: Not on file  . Transportation needs    Medical: Not on  file    Non-medical: Not on file  Tobacco Use  . Smoking status: Never Smoker  . Smokeless tobacco: Never Used  Substance and Sexual Activity  . Alcohol use: No  . Drug use: No  . Sexual activity: Not on file  Lifestyle  . Physical activity    Days per week: Not on file    Minutes per session: Not on file  . Stress: Not on file  Relationships  . Social Musician on phone: Not on file    Gets together: Not on file    Attends religious service: Not on file    Active member of club or organization: Not on file    Attends meetings of clubs or organizations: Not on file    Relationship status: Not on file  Other Topics Concern  . Not on file  Social History Narrative  . Not on file     Family History: The patient's family history includes  Pancreatic cancer in his father; Stomach cancer in his mother. ROS:   Please see the history of present illness.    All other systems reviewed and are negative.  EKGs/Labs/Other Studies Reviewed:    The following studies were reviewed today:  Recent Labs: 11/12/2018: ALT 31; BUN 15; Creatinine, Ser 1.12; Potassium 4.0; Sodium 139 04/08/2019: Hemoglobin 14.4; Platelets 220  Recent Lipid Panel No results found for: CHOL, TRIG, HDL, CHOLHDL, VLDL, LDLCALC, LDLDIRECT  Physical Exam:    VS:  BP 116/76 (BP Location: Right Arm, Patient Position: Sitting, Cuff Size: Normal)   Pulse 77   Ht 5\' 11"  (1.803 m)   Wt 211 lb 3.2 oz (95.8 kg)   SpO2 96%   BMI 29.46 kg/m     Wt Readings from Last 3 Encounters:  04/21/19 211 lb 3.2 oz (95.8 kg)  03/24/19 206 lb (93.4 kg)  03/10/19 213 lb 3.2 oz (96.7 kg)     GEN:  Well nourished, well developed in no acute distress HEENT: Normal NECK: No JVD; No carotid bruits LYMPHATICS: No lymphadenopathy CARDIAC: There is no pericardial rub he has a grade 1/6 to 2/6 aortic outflow murmur soft mid peaking does not encompass S2 no AR RRR, no  rubs, gallops RESPIRATORY:  Clear to auscultation  without rales, wheezing or rhonchi  ABDOMEN: Soft, non-tender, non-distended MUSCULOSKELETAL:  No edema; No deformity  SKIN: Warm and dry NEUROLOGIC:  Alert and oriented x 3 PSYCHIATRIC:  Normal affect    Signed, Shirlee More, MD  04/21/2019 2:12 PM    [facility]

## 2019-04-21 ENCOUNTER — Encounter: Payer: Self-pay | Admitting: Cardiology

## 2019-04-21 ENCOUNTER — Ambulatory Visit (INDEPENDENT_AMBULATORY_CARE_PROVIDER_SITE_OTHER): Payer: Medicare Other | Admitting: Cardiology

## 2019-04-21 ENCOUNTER — Other Ambulatory Visit: Payer: Self-pay

## 2019-04-21 VITALS — BP 116/76 | HR 77 | Ht 71.0 in | Wt 211.2 lb

## 2019-04-21 DIAGNOSIS — E782 Mixed hyperlipidemia: Secondary | ICD-10-CM

## 2019-04-21 DIAGNOSIS — I300 Acute nonspecific idiopathic pericarditis: Secondary | ICD-10-CM | POA: Diagnosis not present

## 2019-04-21 DIAGNOSIS — I25118 Atherosclerotic heart disease of native coronary artery with other forms of angina pectoris: Secondary | ICD-10-CM | POA: Diagnosis not present

## 2019-04-21 DIAGNOSIS — I10 Essential (primary) hypertension: Secondary | ICD-10-CM | POA: Diagnosis not present

## 2019-04-21 MED ORDER — IBUPROFEN 200 MG PO TABS: 200.0000 mg | ORAL_TABLET | Freq: Two times a day (BID) | ORAL | Status: DC

## 2019-04-21 NOTE — Patient Instructions (Signed)
Medication Instructions:  Your physician has recommended you make the following change in your medication:  DECREASE ibuprofen (advil) 200 mg: Take 1 tablet twice daily  If you need a refill on your cardiac medications before your next appointment, please call your pharmacy.   Lab work: Your physician recommends that you return for lab work today: sedimentation rate, CBC, C-reactive protein, CMP, lipid panel.   If you have labs (blood work) drawn today and your tests are completely normal, you will receive your results only by: Marland Kitchen MyChart Message (if you have MyChart) OR . A paper copy in the mail If you have any lab test that is abnormal or we need to change your treatment, we will call you to review the results.  Testing/Procedures: None  Follow-Up: At [facility], you and your health needs are our priority.  As part of our continuing mission to provide you with exceptional heart care, we have created designated Provider Care Teams.  These Care Teams include your primary Cardiologist (physician) and Advanced Practice Providers (APPs -  Physician Assistants and Nurse Practitioners) who all work together to provide you with the care you need, when you need it. You will need a follow up appointment in 3 months.

## 2019-04-22 LAB — COMPREHENSIVE METABOLIC PANEL
ALT: 29 IU/L (ref 0–44)
AST: 26 IU/L (ref 0–40)
Albumin/Globulin Ratio: 2.2 (ref 1.2–2.2)
Albumin: 4.7 g/dL (ref 3.8–4.8)
Alkaline Phosphatase: 66 IU/L (ref 39–117)
BUN/Creatinine Ratio: 21 (ref 10–24)
BUN: 25 mg/dL (ref 8–27)
Bilirubin Total: 0.5 mg/dL (ref 0.0–1.2)
CO2: 20 mmol/L (ref 20–29)
Calcium: 9.4 mg/dL (ref 8.6–10.2)
Chloride: 100 mmol/L (ref 96–106)
Creatinine, Ser: 1.2 mg/dL (ref 0.76–1.27)
GFR calc Af Amer: 72 mL/min/{1.73_m2} (ref >59)
GFR calc non Af Amer: 63 mL/min/{1.73_m2} (ref >59)
Globulin, Total: 2.1 g/dL (ref 1.5–4.5)
Glucose: 148 mg/dL — ABNORMAL HIGH (ref 65–99)
Potassium: 3.8 mmol/L (ref 3.5–5.2)
Sodium: 140 mmol/L (ref 134–144)
Total Protein: 6.8 g/dL (ref 6.0–8.5)

## 2019-04-22 LAB — LIPID PANEL
Chol/HDL Ratio: 2.7 ratio (ref 0.0–5.0)
Cholesterol, Total: 113 mg/dL (ref 100–199)
HDL: 42 mg/dL (ref >39)
LDL Calculated: 27 mg/dL (ref 0–99)
Triglycerides: 222 mg/dL — ABNORMAL HIGH (ref 0–149)
VLDL Cholesterol Cal: 44 mg/dL — ABNORMAL HIGH (ref 5–40)

## 2019-04-22 LAB — CBC
Hematocrit: 42.6 % (ref 37.5–51.0)
Hemoglobin: 14.3 g/dL (ref 13.0–17.7)
MCH: 29.8 pg (ref 26.6–33.0)
MCHC: 33.6 g/dL (ref 31.5–35.7)
MCV: 89 fL (ref 79–97)
Platelets: 252 10*3/uL (ref 150–450)
RBC: 4.8 x10E6/uL (ref 4.14–5.80)
RDW: 14.2 % (ref 11.6–15.4)
WBC: 5.1 10*3/uL (ref 3.4–10.8)

## 2019-04-22 LAB — SEDIMENTATION RATE: Sed Rate: 14 mm/hr (ref 0–30)

## 2019-04-22 LAB — C-REACTIVE PROTEIN: CRP: <1 mg/L (ref 0–10)

## 2019-04-29 ENCOUNTER — Ambulatory Visit: Payer: Medicare Other | Admitting: Cardiology

## 2019-05-21 ENCOUNTER — Telehealth: Payer: Self-pay | Admitting: Cardiology

## 2019-05-21 DIAGNOSIS — I300 Acute nonspecific idiopathic pericarditis: Secondary | ICD-10-CM

## 2019-05-21 DIAGNOSIS — M25541 Pain in joints of right hand: Secondary | ICD-10-CM

## 2019-05-21 NOTE — Telephone Encounter (Signed)
Patient called and states that Dr Estanislado Pandy denied his referral due to not his speciality.  Please call patoet on cell number Monday.

## 2019-05-24 NOTE — Addendum Note (Signed)
Addended by: Austin Miles on: 05/24/2019 09:32 AM   Modules accepted: Orders

## 2019-05-24 NOTE — Telephone Encounter (Signed)
Please advise. Thanks.  

## 2019-05-24 NOTE — Telephone Encounter (Signed)
Left message to return call to discuss rheumatology referral.

## 2019-05-24 NOTE — Telephone Encounter (Signed)
Patient returned call and is agreeable to a rheumatology referral to Dr. Amil Amen. Patient given Dr. Melissa Noon office contact information and address. Advised him that he should get a call from Dr. Melissa Noon office to schedule an appointment due to joint pain and recurrent pericarditis.  Patient verbalized understanding and is agreeable to plan. Referral information faxed successfully. No further questions.

## 2019-05-24 NOTE — Addendum Note (Signed)
Addended by: Austin Miles on: 05/24/2019 10:37 AM   Modules accepted: Orders

## 2019-05-26 NOTE — Telephone Encounter (Signed)
Patient has been scheduled for an appointment with Dr. Amil Amen on 06/30/2019 at 9:00 am. Information received via fax.

## 2019-06-22 ENCOUNTER — Encounter: Payer: Self-pay | Admitting: Cardiology

## 2019-06-22 ENCOUNTER — Other Ambulatory Visit: Payer: Self-pay

## 2019-06-22 ENCOUNTER — Ambulatory Visit (INDEPENDENT_AMBULATORY_CARE_PROVIDER_SITE_OTHER): Payer: Medicare Other | Admitting: Cardiology

## 2019-06-22 VITALS — BP 110/68 | HR 87 | Ht 71.0 in | Wt 213.4 lb

## 2019-06-22 DIAGNOSIS — I10 Essential (primary) hypertension: Secondary | ICD-10-CM

## 2019-06-22 DIAGNOSIS — I2510 Atherosclerotic heart disease of native coronary artery without angina pectoris: Secondary | ICD-10-CM | POA: Diagnosis not present

## 2019-06-22 DIAGNOSIS — I300 Acute nonspecific idiopathic pericarditis: Secondary | ICD-10-CM | POA: Diagnosis not present

## 2019-06-22 DIAGNOSIS — R079 Chest pain, unspecified: Secondary | ICD-10-CM | POA: Diagnosis not present

## 2019-06-22 MED ORDER — IBUPROFEN 800 MG PO TABS: 800.0000 mg | ORAL_TABLET | Freq: Three times a day (TID) | ORAL | 1 refills | Status: DC

## 2019-06-22 MED ORDER — IBUPROFEN 800 MG PO TABS: 800.0000 mg | ORAL_TABLET | Freq: Three times a day (TID) | ORAL | Status: DC

## 2019-06-22 MED ORDER — COLCHICINE 0.6 MG PO TABS: 0.6000 mg | ORAL_TABLET | Freq: Two times a day (BID) | ORAL | 1 refills | Status: DC

## 2019-06-22 NOTE — Progress Notes (Signed)
Cardiology Office Note:    Date:  06/22/2019   ID:  Eugene Taylor, DOB 12/20/1950, MRN [id number]  PCP:  Olive Bassough, Robert L, MD  Cardiologist:  Norman HerrlichBrian Cinch Ormond, MD    Referring MD: Olive Bassough, Robert L, MD    ASSESSMENT:    No diagnosis found. PLAN:    In order of problems listed above:  1. Clinically he has recurrent idiopathic pericarditis treatment further evaluation detailed below and prior to initiating steroids of a rheumatologic evaluation.  Completeness he will also have a troponin drawn.  If steroids are initiated like him to have a cardiac MRI first to look for signs of pericardial inflammation.  He has CAD hypertension hyperlipidemia and aortic valve disease are stable treatment for these is unchanged   Next appointment: 2 to 3 weeks   Medication Adjustments/Labs and Tests Ordered: Current medicines are reviewed at length with the patient today.  Concerns regarding medicines are outlined above.  No orders of the defined types were placed in this encounter.  No orders of the defined types were placed in this encounter.   Chief Complaint  Patient presents with   Pericarditis    History of Present Illness:    Eugene Taylor is a 68 y.o. male with a hx of recurrent idiopathic pericarditis, bicuspid aortic valve with mild stenosis and mid regurgitation, mild enlargement of ascending thoracic aorta 40mm in Oct 2019, HTN, CCAD with PCI and stent of proximal LFx 11/11/18 seen  03/10/2019 with pericarditis recurrence.  His initial episode was September 2017 he was [facility]Wall [facility].  His recurrence initially was September 2019 and again February 2020 most recently July 2020.Marland Kitchen. He was last seen 04/21/2019 improved on colchicine and NSAID and his inflammatory markers had normalized.  He is awaiting rheumatologic evaluation Dr. Dierdre ForthBeekman 06/30/2019. Previous evaluation with antinuclear antibody and complement levels was normal.  Echocardiogram performed  05/11/2018 showed a small 9 mm posterior pericardial effusion. Compliance with diet, lifestyle and medications: Yes  Last 3 weeks is not felt well wonders if he is having pericarditis again he has had chest pain that sharp substernal radiates to the back it occurs when he supine takes a deep breath and he has felt chills.  He does not have a cough or shortness of breath no fever or chills.  He sees me in the office and although his EKG does not show typical findings pericarditis he has a pericardial rub left lower sternal border leaning forward.  Increase his colchicine back to twice daily increase his ibuprofen recheck his inflammatory markers and echocardiogram and await rheumatology evaluation.  I suspect he is going to require a steroid course and current recommendations are low-dose 0.2 2.5 mg/kg for a period of 2 to 4 weeks and then a gradual taper over 6 months.  Prior to she steroids I would like and have a cardiac MRI but await the results of the lab work rheumatologic evaluation and echo cardiogram.  449mo ago (04/21/19) 449mo ago (04/08/19) 49mo ago (03/22/19)    CRP 0 - 10 mg/L <1  <1  1   Resulting Agency  [facility]   449mo ago (04/21/19) 449mo ago (04/08/19) 49mo ago (03/22/19)    Sed Rate 0 - 30 mm/hr 14  25  33High    Resulting Agency  [facility]    Past Medical History:  Diagnosis Date   Acute idiopathic pericarditis 04/25/2016   Overview:  2017: hosp, EKG changes, colchicine/indomethacin  Acute medial meniscal tear 12/15/2014   Angina pectoris ([place]) 05/05/2018   Arthritis    LEFT SHOULDER   Asthma    Benign hypertension 10/24/4968   Complication of anesthesia    " during a knee surgery , my bllod pressure was up & stayed up "   Coronary artery disease    External hemorrhoid, thrombosed 12/28/2015   Overview:  2017:    History of adenomatous polyp of colon    History of kidney stones    Hyperlipidemia    Hypertension    Lower respiratory  infection 02/08/2016   Mixed hyperlipidemia 11/27/2015   Obstructive sleep apnea 11/27/2015   OSA on CPAP    uses cpap   Right knee meniscal tear    Screening for diabetes mellitus (DM) 11/27/2015   Wears glasses     Past Surgical History:  Procedure Laterality Date   APPENDECTOMY  age 68   CARDIAC CATHETERIZATION  11/11/2018   CHONDROPLASTY Right 12/15/2014   Procedure: CHONDROPLASTY;  Surgeon: Gaynelle Arabian, MD;  [facility];  Service: Orthopedics;  Laterality: Right;   COLONOSCOPY W/ POLYPECTOMY  2011   CORONARY STENT INTERVENTION  11/11/2018   CORONARY STENT INTERVENTION N/A 11/11/2018   Procedure: CORONARY STENT INTERVENTION;  Surgeon: Troy Sine, MD;  Location: [place] [facility];  Service: Cardiovascular;  Laterality: N/A;   CYSTO/  URETEROSCOPIC STONE EXTRACTIONS  1995   KNEE ARTHROSCOPY WITH LATERAL MENISECTOMY Right 12/15/2014   Procedure: KNEE ARTHROSCOPY WITH LATERAL MENISECTOMY;  Surgeon: Gaynelle Arabian, MD;  Location: [place];  Service: Orthopedics;  Laterality: Right;   KNEE ARTHROSCOPY WITH MEDIAL MENISECTOMY Right 12/15/2014   Procedure: KNEE ARTHROSCOPY WITH MEDIAL MENISECTOMY;  Surgeon: Gaynelle Arabian, MD;  Location[facility];  Service: Orthopedics;  Laterality: Right;   LEFT HEART CATH AND CORONARY ANGIOGRAPHY N/A 11/11/2018   Procedure: LEFT HEART CATH AND CORONARY ANGIOGRAPHY;  Surgeon: Troy Sine, MD;  Location: [place] [facility];  Service: Cardiovascular;  Laterality: N/A;   NASAL SEPTUM SURGERY  1995    Current Medications: Current Meds  Medication Sig   BRILINTA 90 MG TABS tablet TAKE 1 TABLET BY MOUTH TWICE DAILY.   cholecalciferol (VITAMIN D3) 25 MCG (1000 UT) tablet Take 1,000 Units by mouth daily.   clindamycin (CLEOCIN) 300 MG capsule Take 2 capsules (600 mg total) by mouth as directed. Take 2 capsules (600mg ) 45 minutes prior to dental visits   colchicine 0.6 MG tablet  Take 0.6 mg by mouth daily.   EPIPEN 2-PAK 0.3 MG/0.3ML SOAJ injection Inject 0.3 mg into the muscle as needed for anaphylaxis (red meat).    ibuprofen (ADVIL) 200 MG tablet Take 1 tablet (200 mg total) by mouth 2 (two) times daily.   nitroGLYCERIN (NITROSTAT) 0.4 MG SL tablet Place 1 tablet (0.4 mg total) under the tongue every 5 (five) minutes as needed for chest pain.   rosuvastatin (CRESTOR) 20 MG tablet Take 20 mg by mouth daily.   telmisartan-hydrochlorothiazide (MICARDIS HCT) 40-12.5 MG tablet Take 1 tablet by mouth daily.   vitamin B-12 (CYANOCOBALAMIN) 1000 MCG tablet Take 1,000 mcg by mouth daily.   Current Facility-Administered Medications for the 06/22/19 encounter (Office Visit) with Richardo Priest, MD  Medication   triamcinolone acetonide (KENALOG-40) injection 20 mg     Allergies:   Penicillins and Hydrocodeine [dihydrocodeine]   Social History   Socioeconomic History   Marital status: Married    Spouse name: Not on file  Number of children: Not on file   Years of education: Not on file   Highest education level: Not on file  Occupational History   Not on file  Social Needs   Financial resource strain: Not on file   Food insecurity    Worry: Not on file    Inability: Not on file   Transportation needs    Medical: Not on file    Non-medical: Not on file  Tobacco Use   Smoking status: Never Smoker   Smokeless tobacco: Never Used  Substance and Sexual Activity   Alcohol use: No   Drug use: No   Sexual activity: Not on file  Lifestyle   Physical activity    Days per week: Not on file    Minutes per session: Not on file   Stress: Not on file  Relationships   Social connections    Talks on phone: Not on file    Gets together: Not on file    Attends religious service: Not on file    Active member of club or organization: Not on file    Attends meetings of clubs or organizations: Not on file    Relationship status: Not on file    Other Topics Concern   Not on file  Social History Narrative   Not on file     Family History: The patient's family history includes Pancreatic cancer in his father; Stomach cancer in his mother. ROS:   Please see the history of present illness.    All other systems reviewed and are negative.  EKGs/Labs/Other Studies Reviewed:    The following studies were reviewed today:  EKG:  EKG ordered today and personally reviewed.  The ekg ordered today demonstrates sinus rhythm left atrial enlargement nonspecific T waves  Recent Labs: 04/21/2019: ALT 29; BUN 25; Creatinine, Ser 1.20; Hemoglobin 14.3; Platelets 252; Potassium 3.8; Sodium 140  Recent Lipid Panel    Component Value Date/Time   CHOL 113 04/21/2019 1428   TRIG 222 (H) 04/21/2019 1428   HDL 42 04/21/2019 1428   CHOLHDL 2.7 04/21/2019 1428   LDLCALC 27 04/21/2019 1428    Physical Exam:    VS:  BP 110/68    Pulse 87    Ht 5\' 11"  (1.803 m)    Wt 213 lb 6.4 oz (96.8 kg)    SpO2 98%    BMI 29.76 kg/m     Wt Readings from Last 3 Encounters:  06/22/19 213 lb 6.4 oz (96.8 kg)  04/21/19 211 lb 3.2 oz (95.8 kg)  03/24/19 206 lb (93.4 kg)     GEN:  Well nourished, well developed in no acute distress HEENT: Normal NECK: No JVD; No carotid bruits LYMPHATICS: No lymphadenopathy CARDIAC: As a pleural pericardial several component rub left lower sternal border leaning forward on deep inspiration RRR, no murmurs, rubs, gallops RESPIRATORY:  Clear to auscultation without rales, wheezing or rhonchi  ABDOMEN: Soft, non-tender, non-distended MUSCULOSKELETAL:  No edema; No deformity  SKIN: Warm and dry NEUROLOGIC:  Alert and oriented x 3 PSYCHIATRIC:  Normal affect    Signed, 03/26/19, MD  06/22/2019 3:45 PM    [facility]

## 2019-06-22 NOTE — Patient Instructions (Signed)
Medication Instructions:  Your physician has recommended you make the following change in your medication:   INCREASE colchicine 0.6 tablet: Take 1 tablet twice daily  INCREASE ibuprofen (motrin) 800 mg: Take 1 tablet three times daily   *If you need a refill on your cardiac medications before your next appointment, please call your pharmacy*  Lab Work: Your physician recommends that you return for lab work today: sedimentation rate, C-reactive protein, CBC, troponin I.   If you have labs (blood work) drawn today and your tests are completely normal, you will receive your results only by: Marland Kitchen MyChart Message (if you have MyChart) OR . A paper copy in the mail If you have any lab test that is abnormal or we need to change your treatment, we will call you to review the results.  Testing/Procedures: You had an EKG today.   Your physician has requested that you have an echocardiogram. Echocardiography is a painless test that uses sound waves to create images of your heart. It provides your doctor with information about the size and shape of your heart and how well your heart's chambers and valves are working. This procedure takes approximately one hour. There are no restrictions for this procedure.   Follow-Up: At [facility] you and your health needs are our priority.  As part of our continuing mission to provide you with exceptional heart care, we have created designated Provider Care Teams.  These Care Teams include your primary Cardiologist (physician) and Advanced Practice Providers (APPs -  Physician Assistants and Nurse Practitioners) who all work together to provide you with the care you need, when you need it.  Your next appointment:   3 weeks  The format for your next appointment:   In Person  Provider:   Shirlee More, MD

## 2019-06-23 LAB — CBC
Hematocrit: 44.8 % (ref 37.5–51.0)
Hemoglobin: 14.8 g/dL (ref 13.0–17.7)
MCH: 29.5 pg (ref 26.6–33.0)
MCHC: 33 g/dL (ref 31.5–35.7)
MCV: 89 fL (ref 79–97)
Platelets: 227 10*3/uL (ref 150–450)
RBC: 5.01 x10E6/uL (ref 4.14–5.80)
RDW: 14.4 % (ref 11.6–15.4)
WBC: 8.2 10*3/uL (ref 3.4–10.8)

## 2019-06-23 LAB — TROPONIN I: Troponin I: <0.01 ng/mL (ref 0.00–0.04)

## 2019-06-23 LAB — SEDIMENTATION RATE: Sed Rate: 46 mm/hr — ABNORMAL HIGH (ref 0–30)

## 2019-06-23 LAB — C-REACTIVE PROTEIN: CRP: 32 mg/L — ABNORMAL HIGH (ref 0–10)

## 2019-06-24 ENCOUNTER — Ambulatory Visit (HOSPITAL_BASED_OUTPATIENT_CLINIC_OR_DEPARTMENT_OTHER): Payer: Medicare Other

## 2019-06-30 ENCOUNTER — Ambulatory Visit (HOSPITAL_BASED_OUTPATIENT_CLINIC_OR_DEPARTMENT_OTHER)
Admission: RE | Admit: 2019-06-30 | Discharge: 2019-06-30 | Disposition: A | Discharge status: Home / Self Care | Payer: Medicare Other | Source: Ambulatory Visit | Attending: Cardiology | Admitting: Cardiology

## 2019-06-30 DIAGNOSIS — I300 Acute nonspecific idiopathic pericarditis: Secondary | ICD-10-CM | POA: Diagnosis not present

## 2019-06-30 DIAGNOSIS — I2510 Atherosclerotic heart disease of native coronary artery without angina pectoris: Secondary | ICD-10-CM

## 2019-06-30 DIAGNOSIS — R079 Chest pain, unspecified: Secondary | ICD-10-CM

## 2019-06-30 DIAGNOSIS — I10 Essential (primary) hypertension: Secondary | ICD-10-CM | POA: Diagnosis present

## 2019-06-30 NOTE — Progress Notes (Signed)
  Echocardiogram 2D Echocardiogram has been performed.  Cardell Peach 06/30/2019, 11:56 AM

## 2019-07-03 ENCOUNTER — Other Ambulatory Visit: Payer: Self-pay | Admitting: Cardiology

## 2019-07-05 NOTE — Telephone Encounter (Signed)
Telmisartan refill sent to [facility] in [place]

## 2019-07-14 ENCOUNTER — Other Ambulatory Visit: Payer: Self-pay

## 2019-07-14 ENCOUNTER — Ambulatory Visit (INDEPENDENT_AMBULATORY_CARE_PROVIDER_SITE_OTHER): Payer: Medicare Other | Admitting: Cardiology

## 2019-07-14 ENCOUNTER — Encounter: Payer: Self-pay | Admitting: Cardiology

## 2019-07-14 VITALS — BP 112/78 | HR 80 | Ht 71.0 in | Wt 213.2 lb

## 2019-07-14 DIAGNOSIS — I2510 Atherosclerotic heart disease of native coronary artery without angina pectoris: Secondary | ICD-10-CM | POA: Diagnosis not present

## 2019-07-14 DIAGNOSIS — E782 Mixed hyperlipidemia: Secondary | ICD-10-CM

## 2019-07-14 DIAGNOSIS — I300 Acute nonspecific idiopathic pericarditis: Secondary | ICD-10-CM

## 2019-07-14 DIAGNOSIS — I319 Disease of pericardium, unspecified: Secondary | ICD-10-CM

## 2019-07-14 DIAGNOSIS — I10 Essential (primary) hypertension: Secondary | ICD-10-CM | POA: Diagnosis not present

## 2019-07-14 MED ORDER — IBUPROFEN 800 MG PO TABS: 800.0000 mg | ORAL_TABLET | Freq: Two times a day (BID) | ORAL | 1 refills | Status: DC

## 2019-07-14 NOTE — Progress Notes (Signed)
Cardiology Office Note:    Date:  07/14/2019   ID:  SAYER MASINI, DOB [birth year], MRN [id number]  PCP:  Olive Bass, MD  Cardiologist:  [name], MD    Referring MD: Olive Bass, MD    ASSESSMENT:    1. Recurrent idiopathic pericarditis   2. Essential hypertension   3. Mixed hyperlipidemia   4. CAD in native artery    PLAN:     In order of problems listed above:  1. Again he has improved continue colchicine and ibuprofen recheck his inflammatory markers and prior to starting steroids have a cardiac MR done to look for inflammation pericardial space. 2. Stable hypertension continue current treatment including ARB hydrochlorothiazide check renal function potassium 3. Stable continue his high intensity statin check liver function lipids 4. Stable CAD continue with single antiplatelet agent Brilinta along with lipid-lowering treatment.   Next appointment: 4 weeks   Medication Adjustments/Labs and Tests Ordered: Current medicines are reviewed at length with the patient today.  Concerns regarding medicines are outlined above.  No orders of the defined types were placed in this encounter.  No orders of the defined types were placed in this encounter.   Chief Complaint  Patient presents with  . Follow-up    With recurrent pericarditis after rheumatology evaluation    History of Present Illness:    Eugene Taylor is a 68 y.o. male with a hx of recurrent idiopathic pericarditis, bicuspid aortic valve with mild stenosis and mid regurgitation, mild enlargement of ascending thoracic aorta 40mm in Oct 2019, HTN, CCAD with PCI and stent of proximal LFx 11/11/18 seen  03/10/2019 with pericarditis recurrence.  His initial episode was September 2017 he was seen by Dr. Adelene Amas, [facility] regional cardiology.  His recurrence initially was September 2019 and again February 2020 most recently July 2020.  He was last 04/21/2019 improved on colchicine and NSAID and his  inflammatory markers had normalized.  He is awaiting rheumatologic evaluation Dr. Dierdre Forth 06/30/2019. Previous evaluation with antinuclear antibody and complement levels was normal.  Echocardiogram performed 05/11/2018 showed a small 9 mm posterior pericardial effusion.  He was last seen 06/22/2019.  Compliance with diet, lifestyle and medications: Yes   Ref Range & Units 3wk ago 145mo ago 45mo ago  CRP 0 - 10 mg/L 32High   <1  <1    Ref Range & Units 3wk ago 145mo ago 45mo ago  Sed Rate 0 - 30 mm/hr 46High   14  25    Echo 06/30/2019:  1. Left ventricular ejection fraction, by visual estimation, is 60 to 65%. The left ventricle has normal function. There is mildly increased left ventricular hypertrophy.  2. Left ventricular diastolic parameters are consistent with Grade I diastolic dysfunction (impaired relaxation).  3. Global right ventricle has normal systolic function.The right ventricular size is normal. No increase in right ventricular wall thickness.  4. Left atrial size was normal.  5. Right atrial size was normal.  6. The mitral valve is normal in structure. No evidence of mitral valve regurgitation. No evidence of mitral stenosis.  7. The tricuspid valve is normal in structure. Tricuspid valve regurgitation is not demonstrated.  8. The aortic valve is normal in structure. Aortic valve regurgitation is mild. Mild aortic valve stenosis.  9. The pulmonic valve was normal in structure. Pulmonic valve regurgitation is not visualized. 10. The inferior vena cava is normal in size with greater than 50% respiratory variability, suggesting right atrial pressure of 3 mmHg.  11. Pericardium normal without effusion.  I have wanted him to have a formal rheumatology evaluation with concerns of connective tissue disease. Dr. Dierdre ForthBeekman phoned me just prior to the visit said he seen the patient twice in the office he has had extensive evaluation and has no evidence of underlying rheumatologic disease.  He  has mild osteoarthritis.  As he had done previously he quickly has defervesced and has had no recurrent pericardial pain.  We will recheck his inflammatory markers today have a cardiac MRI done and then start him on steroids between 0.2 -.5 mg/kg, 20 to 50 mg/day for 4 weeks and then gradually wean over 3 months.  I will keep him on his ibuprofen and next visit reduce colchicine.  His CAD hypertension hyperlipidemia are stable 4 weeks 12 Past Medical History:  Diagnosis Date  . Acute idiopathic pericarditis 04/25/2016   Overview:  2017: hosp, EKG changes, colchicine/indomethacin  . Acute medial meniscal tear 12/15/2014  . Angina pectoris (HCC) 05/05/2018  . Arthritis    LEFT SHOULDER  . Asthma   . Benign hypertension 11/27/2015  . Complication of anesthesia    " during a knee surgery , my bllod pressure was up & stayed up "  . Coronary artery disease   . External hemorrhoid, thrombosed 12/28/2015   Overview:  2017:   . History of adenomatous polyp of colon   . History of kidney stones   . Hyperlipidemia   . Hypertension   . Lower respiratory infection 02/08/2016  . Mixed hyperlipidemia 11/27/2015  . Obstructive sleep apnea 11/27/2015  . OSA on CPAP    uses cpap  . Right knee meniscal tear   . Screening for diabetes mellitus (DM) 11/27/2015  . Wears glasses     Past Surgical History:  Procedure Laterality Date  . APPENDECTOMY  age 68  . CARDIAC CATHETERIZATION  11/11/2018  . CHONDROPLASTY Right 12/15/2014   Procedure: CHONDROPLASTY;  Surgeon: Ollen GrossFrank Aluisio, MD;  [facility];  Service: Orthopedics;  Laterality: Right;  . COLONOSCOPY W/ POLYPECTOMY  2011  . CORONARY STENT INTERVENTION  11/11/2018  . CORONARY STENT INTERVENTION N/A 11/11/2018   Procedure: CORONARY STENT INTERVENTION;  Surgeon: Lennette BihariKelly, Thomas A, MD;  [facility];  Service: Cardiovascular;  Laterality: N/A;  . CYSTO/  URETEROSCOPIC STONE EXTRACTIONS  1995  . KNEE ARTHROSCOPY WITH LATERAL  MENISECTOMY Right 12/15/2014   Procedure: KNEE ARTHROSCOPY WITH LATERAL MENISECTOMY;  Surgeon: Ollen GrossFrank Aluisio, MD;  [facility];  Service: Orthopedics;  Laterality: Right;  . KNEE ARTHROSCOPY WITH MEDIAL MENISECTOMY Right 12/15/2014   Procedure: KNEE ARTHROSCOPY WITH MEDIAL MENISECTOMY;  Surgeon: Ollen GrossFrank Aluisio, MD;  [facility];  Service: Orthopedics;  Laterality: Right;  . LEFT HEART CATH AND CORONARY ANGIOGRAPHY N/A 11/11/2018   Procedure: LEFT HEART CATH AND CORONARY ANGIOGRAPHY;  Surgeon: Lennette BihariKelly, Thomas A, MD;  [facility];  Service: Cardiovascular;  Laterality: N/A;  . NASAL SEPTUM SURGERY  1995    Current Medications: Current Meds  Medication Sig  . BRILINTA 90 MG TABS tablet TAKE 1 TABLET BY MOUTH TWICE DAILY.  . cholecalciferol (VITAMIN D3) 25 MCG (1000 UT) tablet Take 1,000 Units by mouth daily.  . clindamycin (CLEOCIN) 300 MG capsule Take 2 capsules (600 mg total) by mouth as directed. Take 2 capsules (600mg ) 45 minutes prior to dental visits  . colchicine 0.6 MG tablet Take 1 tablet (0.6 mg total) by mouth 2 (two) times daily.  .Marland Kitchen  EPIPEN 2-PAK 0.3 MG/0.3ML SOAJ injection Inject 0.3 mg into the muscle as needed for anaphylaxis (red meat).   Marland Kitchen ibuprofen (ADVIL) 800 MG tablet Take 1 tablet (800 mg total) by mouth 3 (three) times daily.  . nitroGLYCERIN (NITROSTAT) 0.4 MG SL tablet Place 1 tablet (0.4 mg total) under the tongue every 5 (five) minutes as needed for chest pain.  . rosuvastatin (CRESTOR) 20 MG tablet Take 20 mg by mouth daily.  Marland Kitchen telmisartan-hydrochlorothiazide (MICARDIS HCT) 40-12.5 MG tablet TAKE 1 TABLET ONCE DAILY.  . vitamin B-12 (CYANOCOBALAMIN) 1000 MCG tablet Take 1,000 mcg by mouth daily.     Allergies:   Penicillins and Hydrocodeine [dihydrocodeine]   Social History   Socioeconomic History  . Marital status: Married    Spouse name: Not on file  . Number of children: Not on file  . Years of  education: Not on file  . Highest education level: Not on file  Occupational History  . Not on file  Social Needs  . Financial resource strain: Not on file  . Food insecurity    Worry: Not on file    Inability: Not on file  . Transportation needs    Medical: Not on file    Non-medical: Not on file  Tobacco Use  . Smoking status: Never Smoker  . Smokeless tobacco: Never Used  Substance and Sexual Activity  . Alcohol use: No  . Drug use: No  . Sexual activity: Not on file  Lifestyle  . Physical activity    Days per week: Not on file    Minutes per session: Not on file  . Stress: Not on file  Relationships  . Social Musician on phone: Not on file    Gets together: Not on file    Attends religious service: Not on file    Active member of club or organization: Not on file    Attends meetings of clubs or organizations: Not on file    Relationship status: Not on file  Other Topics Concern  . Not on file  Social History Narrative  . Not on file     Family History: The patient's family history includes Pancreatic cancer in his father; Stomach cancer in his mother. ROS:   Please see the history of present illness.    All other systems reviewed and are negative.  EKGs/Labs/Other Studies Reviewed:    The following studies were reviewed today:   Recent Labs: 04/21/2019: ALT 29; BUN 25; Creatinine, Ser 1.20; Potassium 3.8; Sodium 140 06/22/2019: Hemoglobin 14.8; Platelets 227  Recent Lipid Panel    Component Value Date/Time   CHOL 113 04/21/2019 1428   TRIG 222 (H) 04/21/2019 1428   HDL 42 04/21/2019 1428   CHOLHDL 2.7 04/21/2019 1428   LDLCALC 27 04/21/2019 1428    Physical Exam:    VS:  BP 112/78 (BP Location: Right Arm, Patient Position: Sitting, Cuff Size: Normal)   Pulse 80   Ht 5\' 11"  (1.803 m)   Wt 213 lb 3.2 oz (96.7 kg)   SpO2 98%   BMI 29.74 kg/m     Wt Readings from Last 3 Encounters:  07/14/19 213 lb 3.2 oz (96.7 kg)  06/22/19 213 lb  6.4 oz (96.8 kg)  04/21/19 211 lb 3.2 oz (95.8 kg)     GEN:  Well nourished, well developed in no acute distress HEENT: Normal NECK: No JVD; No carotid bruits LYMPHATICS: No lymphadenopathy CARDIAC: RRR, no murmurs, rubs, gallops RESPIRATORY:  Clear to auscultation without rales, wheezing or rhonchi  ABDOMEN: Soft, non-tender, non-distended MUSCULOSKELETAL:  No edema; No deformity  SKIN: Warm and dry NEUROLOGIC:  Alert and oriented x 3 PSYCHIATRIC:  Normal affect    Signed, Shirlee More, MD  07/14/2019 1:48 PM    [facility]

## 2019-07-14 NOTE — Patient Instructions (Addendum)
Medication Instructions:  Your physician has recommended you make the following change in your medication:   DECREASE ibuprofen (advil) 800 mg: Take 1 tablet twice daily  *If you need a refill on your cardiac medications before your next appointment, please call your pharmacy*  Lab Work: Your physician recommends that you return for lab work today: CBC, CMP, lipid panel, C-reactive protein, sedimentation rate.   If you have labs (blood work) drawn today and your tests are completely normal, you will receive your results only by: Marland Kitchen MyChart Message (if you have MyChart) OR . A paper copy in the mail If you have any lab test that is abnormal or we need to change your treatment, we will call you to review the results.  Testing/Procedures: Your physician has requested that you have a cardiac MRI. Cardiac MRI uses a computer to create images of your heart as its beating, producing both still and moving pictures of your heart and major blood vessels. For further information please visit [email or web address] Please follow the instruction sheet given to you today for more information. You will be contacted to schedule this appointment at [facility]. There are no preparation instructions for testing. Please arrive to the first floor admitting office 45 minutes prior to testing time.   Follow-Up: At [facility], you and your health needs are our priority.  As part of our continuing mission to provide you with exceptional heart care, we have created designated Provider Care Teams.  These Care Teams include your primary Cardiologist (physician) and Advanced Practice Providers (APPs -  Physician Assistants and Nurse Practitioners) who all work together to provide you with the care you need, when you need it.  Your next appointment:   4 week(s)  The format for your next appointment:   In Person  Provider:   Shirlee More, MD

## 2019-07-15 LAB — COMPREHENSIVE METABOLIC PANEL
ALT: 72 IU/L — ABNORMAL HIGH (ref 0–44)
AST: 87 IU/L — ABNORMAL HIGH (ref 0–40)
Albumin/Globulin Ratio: 2 (ref 1.2–2.2)
Albumin: 4.5 g/dL (ref 3.8–4.8)
Alkaline Phosphatase: 88 IU/L (ref 39–117)
BUN/Creatinine Ratio: 19 (ref 10–24)
BUN: 24 mg/dL (ref 8–27)
Bilirubin Total: 0.5 mg/dL (ref 0.0–1.2)
CO2: 20 mmol/L (ref 20–29)
Calcium: 9.7 mg/dL (ref 8.6–10.2)
Chloride: 100 mmol/L (ref 96–106)
Creatinine, Ser: 1.27 mg/dL (ref 0.76–1.27)
GFR calc Af Amer: 68 mL/min/{1.73_m2} (ref >59)
GFR calc non Af Amer: 58 mL/min/{1.73_m2} — ABNORMAL LOW (ref >59)
Globulin, Total: 2.3 g/dL (ref 1.5–4.5)
Glucose: 220 mg/dL — ABNORMAL HIGH (ref 65–99)
Potassium: 4 mmol/L (ref 3.5–5.2)
Sodium: 137 mmol/L (ref 134–144)
Total Protein: 6.8 g/dL (ref 6.0–8.5)

## 2019-07-15 LAB — CBC
Hematocrit: 43 % (ref 37.5–51.0)
Hemoglobin: 14.7 g/dL (ref 13.0–17.7)
MCH: 29.8 pg (ref 26.6–33.0)
MCHC: 34.2 g/dL (ref 31.5–35.7)
MCV: 87 fL (ref 79–97)
Platelets: 243 10*3/uL (ref 150–450)
RBC: 4.94 x10E6/uL (ref 4.14–5.80)
RDW: 14.1 % (ref 11.6–15.4)
WBC: 5.9 10*3/uL (ref 3.4–10.8)

## 2019-07-15 LAB — LIPID PANEL
Chol/HDL Ratio: 3.1 ratio (ref 0.0–5.0)
Cholesterol, Total: 104 mg/dL (ref 100–199)
HDL: 34 mg/dL — ABNORMAL LOW (ref >39)
LDL Chol Calc (NIH): 28 mg/dL (ref 0–99)
Triglycerides: 285 mg/dL — ABNORMAL HIGH (ref 0–149)
VLDL Cholesterol Cal: 42 mg/dL — ABNORMAL HIGH (ref 5–40)

## 2019-07-15 LAB — C-REACTIVE PROTEIN: CRP: <1 mg/L (ref 0–10)

## 2019-07-15 LAB — SEDIMENTATION RATE: Sed Rate: 6 mm/hr (ref 0–30)

## 2019-07-26 ENCOUNTER — Telehealth: Payer: Self-pay | Admitting: *Deleted

## 2019-07-26 ENCOUNTER — Encounter: Payer: Self-pay | Admitting: *Deleted

## 2019-07-26 NOTE — Telephone Encounter (Signed)
Left message regarding appointment  For Cardiac MRI scheduled 08/13/19 at 9:00 am--arrival time is 8:15 am 1st floor admissions office at [facility].  Willmail information to patient and it is also available in My Chart.

## 2019-08-12 ENCOUNTER — Encounter (HOSPITAL_COMMUNITY): Payer: Self-pay

## 2019-08-12 ENCOUNTER — Telehealth (HOSPITAL_COMMUNITY): Payer: Self-pay | Admitting: Emergency Medicine

## 2019-08-12 NOTE — Telephone Encounter (Signed)
Reaching out to patient to offer assistance regarding upcoming cardiac imaging study; pt verbalizes understanding of appt date/time, parking situation and where to check in, and verified current allergies; name and call back number provided for further questions should they arise Parisa Pinela RN Navigator Cardiac Imaging [facility] [phone] office [phone] cell 

## 2019-08-13 ENCOUNTER — Ambulatory Visit (HOSPITAL_COMMUNITY)
Admission: RE | Admit: 2019-08-13 | Discharge: 2019-08-13 | Disposition: A | Discharge status: Home / Self Care | Payer: Medicare Other | Source: Ambulatory Visit | Attending: Cardiology | Admitting: Cardiology

## 2019-08-13 ENCOUNTER — Other Ambulatory Visit: Payer: Self-pay

## 2019-08-13 ENCOUNTER — Ambulatory Visit: Payer: Medicare Other | Admitting: Cardiology

## 2019-08-13 DIAGNOSIS — I319 Disease of pericardium, unspecified: Secondary | ICD-10-CM | POA: Insufficient documentation

## 2019-08-13 DIAGNOSIS — I300 Acute nonspecific idiopathic pericarditis: Secondary | ICD-10-CM

## 2019-08-13 MED ORDER — GADOBUTROL 1 MMOL/ML IV SOLN
10.0000 mL | Freq: Once | INTRAVENOUS | Status: AC | PRN
  Administered 2019-08-13: 10:00:00 10 mL via INTRAVENOUS

## 2019-08-18 ENCOUNTER — Telehealth: Payer: Self-pay | Admitting: Emergency Medicine

## 2019-08-18 NOTE — Telephone Encounter (Signed)
Left message for patient to return call regarding results  

## 2019-08-21 NOTE — Progress Notes (Deleted)
Cardiology Office Note:    Date:  08/21/2019   ID:  Eugene Taylor, DOB 03/30/1951, MRN [id number]  PCP:  Olive Bass, MD  Cardiologist:  [name], MD    Referring MD: Olive Bass, MD    ASSESSMENT:    No diagnosis found. PLAN:    In order of problems listed above:  1. ***   Next appointment: ***   Medication Adjustments/Labs and Tests Ordered: Current medicines are reviewed at length with the patient today.  Concerns regarding medicines are outlined above.  No orders of the defined types were placed in this encounter.  No orders of the defined types were placed in this encounter.   No chief complaint on file.   History of Present Illness:    Eugene Taylor is a 68 y.o. male with a hx of  recurrent idiopathic pericarditis, bicuspid aortic valve with mild stenosis and mid regurgitation, mild enlargement of ascending thoracic aorta 72mm in Oct 2019, HTN, CCAD with PCI and stent of proximal LFx 11/11/18 seen  03/10/2019 with pericarditis recurrence.  His initial episode was September 2017 he was seen by Dr. Adelene Amas, [facility] regional cardiology.  His recurrence initially was September 2019 and again February 2020 most recently July 2020. He was last seen 07/14/2019. He has been seen by rheumatology and has no evidence of underlying connective tissue disease. Compliance with diet, lifestyle and medications: ***  Cardiac MR 08/13/2019: IMPRESSION: 1. Mild delayed pericardial enhancement seen adjacent to the lateral wall of the left ventricle. No definite pericardial edema. This suggests fibrosis without acute inflammation, and is consistent with chronic pericarditis. 2. Tricuspid aortic valve with moderately reduced leaflet excursion. Mild aortic valve stenosis and regurgitation. 3. Normal biventricular chamber size and function. LVEF 71 %, RVEF 59%. 4.  No delayed myocardial enhancement.   Ref Range & Units 1 mo ago 2 mo ago 4 mo ago  Sed Rate 0 - 30 mm/hr 6   46High   14    Ref Range & Units 1 mo ago 2 mo ago 4 mo ago   CRP 0 - 10 mg/L <1  32High   <1     Past Medical History:  Diagnosis Date  . Acute idiopathic pericarditis 04/25/2016   Overview:  2017: hosp, EKG changes, colchicine/indomethacin  . Acute medial meniscal tear 12/15/2014  . Angina pectoris (HCC) 05/05/2018  . Arthritis    LEFT SHOULDER  . Asthma   . Benign hypertension 11/27/2015  . Complication of anesthesia    " during a knee surgery , my bllod pressure was up & stayed up "  . Coronary artery disease   . External hemorrhoid, thrombosed 12/28/2015   Overview:  2017:   . History of adenomatous polyp of colon   . History of kidney stones   . Hyperlipidemia   . Hypertension   . Lower respiratory infection 02/08/2016  . Mixed hyperlipidemia 11/27/2015  . Obstructive sleep apnea 11/27/2015  . OSA on CPAP    uses cpap  . Right knee meniscal tear   . Screening for diabetes mellitus (DM) 11/27/2015  . Wears glasses     Past Surgical History:  Procedure Laterality Date  . APPENDECTOMY  age 20  . CARDIAC CATHETERIZATION  11/11/2018  . CHONDROPLASTY Right 12/15/2014   Procedure: CHONDROPLASTY;  Surgeon: Ollen Gross, MD;  [facility];  Service: Orthopedics;  Laterality: Right;  . COLONOSCOPY W/ POLYPECTOMY  2011  . CORONARY STENT INTERVENTION  11/11/2018  .  CORONARY STENT INTERVENTION N/A 11/11/2018   Procedure: CORONARY STENT INTERVENTION;  Surgeon: Troy Sine, MD;  Location: [facility];  Service: Cardiovascular;  Laterality: N/A;  . CYSTO/  URETEROSCOPIC STONE EXTRACTIONS  1995  . KNEE ARTHROSCOPY WITH LATERAL MENISECTOMY Right 12/15/2014   Procedure: KNEE ARTHROSCOPY WITH LATERAL MENISECTOMY;  Surgeon: Gaynelle Arabian, MD;  [facility];  Service: Orthopedics;  Laterality: Right;  . KNEE ARTHROSCOPY WITH MEDIAL MENISECTOMY Right 12/15/2014   Procedure: KNEE ARTHROSCOPY WITH MEDIAL MENISECTOMY;  Surgeon: Gaynelle Arabian,  MD;  Location: [facility];  Service: Orthopedics;  Laterality: Right;  . LEFT HEART CATH AND CORONARY ANGIOGRAPHY N/A 11/11/2018   Procedure: LEFT HEART CATH AND CORONARY ANGIOGRAPHY;  Surgeon: Troy Sine, MD;  Location: [place] [facility];  Service: Cardiovascular;  Laterality: N/A;  . NASAL SEPTUM SURGERY  1995    Current Medications: No outpatient medications have been marked as taking for the 08/23/19 encounter (Appointment) with Richardo Priest, MD.     Allergies:   Penicillins and Hydrocodeine [dihydrocodeine]   Social History   Socioeconomic History  . Marital status: Married    Spouse name: Not on file  . Number of children: Not on file  . Years of education: Not on file  . Highest education level: Not on file  Occupational History  . Not on file  Tobacco Use  . Smoking status: Never Smoker  . Smokeless tobacco: Never Used  Substance and Sexual Activity  . Alcohol use: No  . Drug use: No  . Sexual activity: Not on file  Other Topics Concern  . Not on file  Social History Narrative  . Not on file   Social Determinants of Health   Financial Resource Strain:   . Difficulty of Paying Living Expenses: Not on file  Food Insecurity:   . Worried About Charity fundraiser in the Last Year: Not on file  . Ran Out of Food in the Last Year: Not on file  Transportation Needs:   . Lack of Transportation (Medical): Not on file  . Lack of Transportation (Non-Medical): Not on file  Physical Activity:   . Days of Exercise per Week: Not on file  . Minutes of Exercise per Session: Not on file  Stress:   . Feeling of Stress : Not on file  Social Connections:   . Frequency of Communication with Friends and Family: Not on file  . Frequency of Social Gatherings with Friends and Family: Not on file  . Attends Religious Services: Not on file  . Active Member of Clubs or Organizations: Not on file  . Attends Archivist Meetings: Not on file  . Marital  Status: Not on file     Family History: The patient's ***family history includes Pancreatic cancer in his father; Stomach cancer in his mother. ROS:   Please see the history of present illness.    All other systems reviewed and are negative.  EKGs/Labs/Other Studies Reviewed:    The following studies were reviewed today:  EKG:  EKG ordered today and personally reviewed.  The ekg ordered today demonstrates ***  Recent Labs: 07/14/2019: ALT 72; BUN 24; Creatinine, Ser 1.27; Hemoglobin 14.7; Platelets 243; Potassium 4.0; Sodium 137  Recent Lipid Panel    Component Value Date/Time   CHOL 104 07/14/2019 1411   TRIG 285 (H) 07/14/2019 1411   HDL 34 (L) 07/14/2019 1411   CHOLHDL 3.1 07/14/2019 1411   LDLCALC 28  07/14/2019 1411    Physical Exam:    VS:  There were no vitals taken for this visit.    Wt Readings from Last 3 Encounters:  07/14/19 213 lb 3.2 oz (96.7 kg)  06/22/19 213 lb 6.4 oz (96.8 kg)  04/21/19 211 lb 3.2 oz (95.8 kg)     GEN: *** Well nourished, well developed in no acute distress HEENT: Normal NECK: No JVD; No carotid bruits LYMPHATICS: No lymphadenopathy CARDIAC: ***RRR, no murmurs, rubs, gallops RESPIRATORY:  Clear to auscultation without rales, wheezing or rhonchi  ABDOMEN: Soft, non-tender, non-distended MUSCULOSKELETAL:  No edema; No deformity  SKIN: Warm and dry NEUROLOGIC:  Alert and oriented x 3 PSYCHIATRIC:  Normal affect    Signed, Norman HerrlichBrian Kees Idrovo, MD  08/21/2019 3:22 PM    [facility]

## 2019-08-23 ENCOUNTER — Ambulatory Visit: Payer: Medicare Other | Admitting: Cardiology

## 2019-08-25 ENCOUNTER — Telehealth: Payer: Self-pay | Admitting: Emergency Medicine

## 2019-08-25 NOTE — Telephone Encounter (Signed)
Left message for patient to return call regarding results  

## 2019-08-28 NOTE — Progress Notes (Signed)
Cardiology Office Note:    Date:  08/30/2019   ID:  Eugene Taylor, DOB 10-22-1950, MRN [id number]  PCP:  Olive Bass, MD  Cardiologist:  [name], MD    Referring MD: Olive Bass, MD    ASSESSMENT:    1. Recurrent idiopathic pericarditis   2. CAD in native artery   3. Essential hypertension   4. Mixed hyperlipidemia   5. Renal cyst   6. Bicuspid aortic valve    PLAN:    In order of problems listed above:  1. He has recurrent idiopathic pericarditis some findings of chronic pericarditis on MR he has failed conservative therapy and we will add on steroids as described with a gradual tapering after 1 month of 30 mg a day.  Check markers today CRP sedimentation rate repeat in 1 month and begin downward titration.  Reduce ibuprofen by 50% reduce colchicine to once daily. 2. Stable CAD continue medical therapy including dual antiplatelet and he will delay defer orthopedic surgery until off 1 year after intervention 3. Stable hypertension continue current treatment 4. Stable dyslipidemia lipids are ideal LDL at target 5. Itself not a concern however family history of renal cancer and to get complete visualization have a duplex performed if it is anything but simple and require MRI and referral to urology 6. Continue endocarditis prophylaxis, I personally reviewed both the CMR images and echocardiogram and I think he has a functionally bicuspid valve   Next appointment: 1 month   Medication Adjustments/Labs and Tests Ordered: Current medicines are reviewed at length with the patient today.  Concerns regarding medicines are outlined above.  No orders of the defined types were placed in this encounter.  No orders of the defined types were placed in this encounter.   Chief Complaint  Patient presents with  . Follow-up    History of Present Illness:    Eugene Taylor is a 69 y.o. male with a hx of recurrent idiopathic pericarditis, bicuspid aortic valve with mild  stenosis and mid regurgitation, mild enlargement of ascending thoracic aorta 15mm in Oct 2019, HTN, CCAD with PCI and stent of proximal LFx 11/11/18 seen  03/10/2019 with pericarditis recurrence.  His initial episode was September 2017 he was seen by Dr. Adelene Amas, [facility] regional cardiology.  His recurrence initially was September 2019 and again February 2020 most recently July 2020.   last seen 07/14/2019. Compliance with diet, lifestyle and medications: Yes   Ref Range & Units 1 mo ago 2 mo ago 4 mo ago  CRP 0 - 10 mg/L <1  32High   <1    Ref Range & Units 1 mo ago 2 mo ago 4 mo ago   Sed Rate 0 - 30 mm/hr 6  46High   14    Cardiac MR 08/13/2019: IMPRESSION: 1. Mild delayed pericardial enhancement seen adjacent to the lateral wall of the left ventricle. No definite pericardial edema. This suggests fibrosis without acute inflammation, and is consistent with chronic pericarditis.   2. Tricuspid aortic valve with moderately reduced leaflet excursion. Mild aortic valve stenosis and regurgitation.   3. Normal biventricular chamber size and function. LVEF 71 %, RVEF 59%.   4.  No delayed myocardial enhancement.   I reviewed his MRI with the patient he has questions about a bicuspid valve I reviewed both the CMR and the echo I still think he has a bicuspid or functionally bicuspid valve and it does not change his treatment he will continue endocarditis  prophylaxis and will survey an echocardiogram in 1 year.  Questions the renal cyst I told him it is usually a benign finding but he has both his sister and mother had renal cancer and a duplex will be performed at his request.  He has had no symptoms of pericarditis no fever chills palpitation or chest pain and tolerates both colchicine and ibuprofen.  I reviewed the findings of his cardiac MRI with him and the fact that he has had relapsing pericarditis recent information regarding treatment with steroids and will start him on approximately 0.25 mg  of prednisone a day per kilogram for the next month and then a gradual tapering.  We will recheck CRP and sedimentation rate today.  Aloe with me in 1 month.  He inquires about orthopedic elective surgery and we will delay until he is off dual antiplatelet therapy.  He is agreeable shoulder pain is tolerable Past Medical History:  Diagnosis Date  . Acute idiopathic pericarditis 04/25/2016   Overview:  2017: hosp, EKG changes, colchicine/indomethacin  . Acute medial meniscal tear 12/15/2014  . Angina pectoris (HCC) 05/05/2018  . Arthritis    LEFT SHOULDER  . Asthma   . Benign hypertension 11/27/2015  . Complication of anesthesia    " during a knee surgery , my bllod pressure was up & stayed up "  . Coronary artery disease   . External hemorrhoid, thrombosed 12/28/2015   Overview:  2017:   . History of adenomatous polyp of colon   . History of kidney stones   . Hyperlipidemia   . Hypertension   . Lower respiratory infection 02/08/2016  . Mixed hyperlipidemia 11/27/2015  . Obstructive sleep apnea 11/27/2015  . OSA on CPAP    uses cpap  . Right knee meniscal tear   . Screening for diabetes mellitus (DM) 11/27/2015  . Wears glasses     Past Surgical History:  Procedure Laterality Date  . APPENDECTOMY  age 31  . CARDIAC CATHETERIZATION  11/11/2018  . CHONDROPLASTY Right 12/15/2014   Procedure: CHONDROPLASTY;  Surgeon: Ollen Gross, MD;  [facility];  Service: Orthopedics;  Laterality: Right;  . COLONOSCOPY W/ POLYPECTOMY  2011  . CORONARY STENT INTERVENTION  11/11/2018  . CORONARY STENT INTERVENTION N/A 11/11/2018   Procedure: CORONARY STENT INTERVENTION;  Surgeon: Lennette Bihari, MD;  Location: [facility] INVASIVE CV LAB;  Service: Cardiovascular;  Laterality: N/A;  . CYSTO/  URETEROSCOPIC STONE EXTRACTIONS  1995  . KNEE ARTHROSCOPY WITH LATERAL MENISECTOMY Right 12/15/2014   Procedure: KNEE ARTHROSCOPY WITH LATERAL MENISECTOMY;  Surgeon: Ollen Gross, MD;  Location[facility]  Service: Orthopedics;  Laterality: Right;  . KNEE ARTHROSCOPY WITH MEDIAL MENISECTOMY Right 12/15/2014   Procedure: KNEE ARTHROSCOPY WITH MEDIAL MENISECTOMY;  Surgeon: Ollen Gross, MD;  Location: [facility];  Service: Orthopedics;  Laterality: Right;  . LEFT HEART CATH AND CORONARY ANGIOGRAPHY N/A 11/11/2018   Procedure: LEFT HEART CATH AND CORONARY ANGIOGRAPHY;  Surgeon: Lennette Bihari, MD;  Location: [facility] INVASIVE CV LAB;  Service: Cardiovascular;  Laterality: N/A;  . NASAL SEPTUM SURGERY  1995    Current Medications: Current Meds  Medication Sig  . BRILINTA 90 MG TABS tablet TAKE 1 TABLET BY MOUTH TWICE DAILY.  . cholecalciferol (VITAMIN D3) 25 MCG (1000 UT) tablet Take 1,000 Units by mouth daily.  . clindamycin (CLEOCIN) 300 MG capsule Take 2 capsules (600 mg total) by mouth as directed. Take 2 capsules (600mg ) 45 minutes prior to dental  visits  . colchicine 0.6 MG tablet Take 1 tablet (0.6 mg total) by mouth 2 (two) times daily.  Marland Kitchen EPIPEN 2-PAK 0.3 MG/0.3ML SOAJ injection Inject 0.3 mg into the muscle as needed for anaphylaxis (red meat).   Marland Kitchen ibuprofen (ADVIL) 800 MG tablet Take 1 tablet (800 mg total) by mouth 2 (two) times daily.  . nitroGLYCERIN (NITROSTAT) 0.4 MG SL tablet Place 1 tablet (0.4 mg total) under the tongue every 5 (five) minutes as needed for chest pain.  . rosuvastatin (CRESTOR) 20 MG tablet Take 20 mg by mouth daily.  Marland Kitchen telmisartan-hydrochlorothiazide (MICARDIS HCT) 40-12.5 MG tablet TAKE 1 TABLET ONCE DAILY.  . vitamin B-12 (CYANOCOBALAMIN) 1000 MCG tablet Take 1,000 mcg by mouth daily.     Allergies:   Penicillins and Hydrocodeine [dihydrocodeine]   Social History   Socioeconomic History  . Marital status: Married    Spouse name: Not on file  . Number of children: Not on file  . Years of education: Not on file  . Highest education level: Not on file  Occupational History  . Not on file  Tobacco Use  . Smoking status:  Never Smoker  . Smokeless tobacco: Never Used  Substance and Sexual Activity  . Alcohol use: No  . Drug use: No  . Sexual activity: Not on file  Other Topics Concern  . Not on file  Social History Narrative  . Not on file   Social Determinants of Health   Financial Resource Strain:   . Difficulty of Paying Living Expenses: Not on file  Food Insecurity:   . Worried About Charity fundraiser in the Last Year: Not on file  . Ran Out of Food in the Last Year: Not on file  Transportation Needs:   . Lack of Transportation (Medical): Not on file  . Lack of Transportation (Non-Medical): Not on file  Physical Activity:   . Days of Exercise per Week: Not on file  . Minutes of Exercise per Session: Not on file  Stress:   . Feeling of Stress : Not on file  Social Connections:   . Frequency of Communication with Friends and Family: Not on file  . Frequency of Social Gatherings with Friends and Family: Not on file  . Attends Religious Services: Not on file  . Active Member of Clubs or Organizations: Not on file  . Attends Archivist Meetings: Not on file  . Marital Status: Not on file     Family History: The patient's family history includes Pancreatic cancer in his father; Stomach cancer in his mother. ROS:   Please see the history of present illness.    All other systems reviewed and are negative.  EKGs/Labs/Other Studies Reviewed:    The following studies were reviewed today:  EKG:  EKG 06/22/2019 showed sinus rhythm normal no findings of pericarditis  Recent Labs: 07/14/2019: ALT 72; BUN 24; Creatinine, Ser 1.27; Hemoglobin 14.7; Platelets 243; Potassium 4.0; Sodium 137  Recent Lipid Panel    Component Value Date/Time   CHOL 104 07/14/2019 1411   TRIG 285 (H) 07/14/2019 1411   HDL 34 (L) 07/14/2019 1411   CHOLHDL 3.1 07/14/2019 1411   LDLCALC 28 07/14/2019 1411    Physical Exam:    VS:  BP 120/78 (BP Location: Left Arm, Patient Position: Sitting, Cuff  Size: Normal)   Pulse 76   Ht 5\' 11"  (1.803 m)   Wt 207 lb (93.9 kg)   SpO2 97%   BMI 28.87 kg/m  Wt Readings from Last 3 Encounters:  08/30/19 207 lb (93.9 kg)  07/14/19 213 lb 3.2 oz (96.7 kg)  06/22/19 213 lb 6.4 oz (96.8 kg)     GEN:  Well nourished, well developed in no acute distress HEENT: Normal NECK: No JVD; No carotid bruits LYMPHATICS: No lymphadenopathy CARDIAC: No rub 1 of 6 localized murmur aortic outflow does not encompass S2 no aortic regurgitation does not radiate to the carotids RRR, no murmurs, rubs, gallops RESPIRATORY:  Clear to auscultation without rales, wheezing or rhonchi  ABDOMEN: Soft, non-tender, non-distended MUSCULOSKELETAL:  No edema; No deformity  SKIN: Warm and dry NEUROLOGIC:  Alert and oriented x 3 PSYCHIATRIC:  Normal affect    Signed, [name], MD  08/30/2019 9:15 AM    [facility]

## 2019-08-30 ENCOUNTER — Other Ambulatory Visit: Payer: Self-pay

## 2019-08-30 ENCOUNTER — Ambulatory Visit (INDEPENDENT_AMBULATORY_CARE_PROVIDER_SITE_OTHER): Payer: Medicare Other | Admitting: Cardiology

## 2019-08-30 ENCOUNTER — Encounter: Payer: Self-pay | Admitting: Cardiology

## 2019-08-30 VITALS — BP 120/78 | HR 76 | Ht 71.0 in | Wt 207.0 lb

## 2019-08-30 DIAGNOSIS — E782 Mixed hyperlipidemia: Secondary | ICD-10-CM

## 2019-08-30 DIAGNOSIS — I300 Acute nonspecific idiopathic pericarditis: Secondary | ICD-10-CM

## 2019-08-30 DIAGNOSIS — N281 Cyst of kidney, acquired: Secondary | ICD-10-CM

## 2019-08-30 DIAGNOSIS — I10 Essential (primary) hypertension: Secondary | ICD-10-CM

## 2019-08-30 DIAGNOSIS — Q231 Congenital insufficiency of aortic valve: Secondary | ICD-10-CM

## 2019-08-30 DIAGNOSIS — I2510 Atherosclerotic heart disease of native coronary artery without angina pectoris: Secondary | ICD-10-CM

## 2019-08-30 MED ORDER — COLCHICINE 0.6 MG PO TABS: 0.6000 mg | ORAL_TABLET | Freq: Every day | ORAL | 1 refills | Status: DC

## 2019-08-30 MED ORDER — IBUPROFEN 800 MG PO TABS: 400.0000 mg | ORAL_TABLET | Freq: Two times a day (BID) | ORAL | Status: DC

## 2019-08-30 MED ORDER — PREDNISONE 20 MG PO TABS: 30.0000 mg | ORAL_TABLET | Freq: Every day | ORAL | 1 refills | Status: DC

## 2019-08-30 NOTE — Addendum Note (Signed)
Addended by: Lita Mains on: 08/30/2019 09:48 AM   Modules accepted: Orders

## 2019-08-30 NOTE — Patient Instructions (Signed)
Medication Instructions:  Your physician has recommended you make the following change in your medication:  Decrease: Ibuprofen to 400 mg twice daily   Decrease Colchicine: to only once a day   Start: Prednisone 30 mg daily    *If you need a refill on your cardiac medications before your next appointment, please call your pharmacy*  Lab Work: Your physician recommends that you return for lab work in: sedimentation rate, crp   If you have labs (blood work) drawn today and your tests are completely normal, you will receive your results only by: Marland Kitchen MyChart Message (if you have MyChart) OR . A paper copy in the mail If you have any lab test that is abnormal or we need to change your treatment, we will call you to review the results.  Testing/Procedures: Your physician has requested that you have a renal ultrasound.     Follow-Up: At [facility], you and your health needs are our priority.  As part of our continuing mission to provide you with exceptional heart care, we have created designated Provider Care Teams.  These Care Teams include your primary Cardiologist (physician) and Advanced Practice Providers (APPs -  Physician Assistants and Nurse Practitioners) who all work together to provide you with the care you need, when you need it.  Your next appointment:   1 month(s)  The format for your next appointment:   In Person  Provider:   Shirlee More, MD  Other Instructions  Prednisone tablets What is this medicine? PREDNISONE (PRED ni sone) is a corticosteroid. It is commonly used to treat inflammation of the skin, joints, lungs, and other organs. Common conditions treated include asthma, allergies, and arthritis. It is also used for other conditions, such as blood disorders and diseases of the adrenal glands. This medicine may be used for other purposes; ask your health care provider or pharmacist if you have questions. COMMON BRAND NAME(S): Deltasone, Predone, Sterapred,  Sterapred DS What should I tell my health care provider before I take this medicine? They need to know if you have any of these conditions:  Cushing's syndrome  diabetes  glaucoma  heart disease  high blood pressure  infection (especially a virus infection such as chickenpox, cold sores, or herpes)  kidney disease  liver disease  mental illness  myasthenia gravis  osteoporosis  seizures  stomach or intestine problems  thyroid disease  an unusual or allergic reaction to lactose, prednisone, other medicines, foods, dyes, or preservatives  pregnant or trying to get pregnant  breast-feeding How should I use this medicine? Take this medicine by mouth with a glass of water. Follow the directions on the prescription label. Take this medicine with food. If you are taking this medicine once a day, take it in the morning. Do not take more medicine than you are told to take. Do not suddenly stop taking your medicine because you may develop a severe reaction. Your doctor will tell you how much medicine to take. If your doctor wants you to stop the medicine, the dose may be slowly lowered over time to avoid any side effects. Talk to your pediatrician regarding the use of this medicine in children. Special care may be needed. Overdosage: If you think you have taken too much of this medicine contact a poison control center or emergency room at once. NOTE: This medicine is only for you. Do not share this medicine with others. What if I miss a dose? If you miss a dose, take it as soon  as you can. If it is almost time for your next dose, talk to your doctor or health care professional. You may need to miss a dose or take an extra dose. Do not take double or extra doses without advice. What may interact with this medicine? Do not take this medicine with any of the following medications:  metyrapone  mifepristone This medicine may also interact with the following  medications:  aminoglutethimide  amphotericin B  aspirin and aspirin-like medicines  barbiturates  certain medicines for diabetes, like glipizide or glyburide  cholestyramine  cholinesterase inhibitors  cyclosporine  digoxin  diuretics  ephedrine  male hormones, like estrogens and birth control pills  isoniazid  ketoconazole  NSAIDS, medicines for pain and inflammation, like ibuprofen or naproxen  phenytoin  rifampin  toxoids  vaccines  warfarin This list may not describe all possible interactions. Give your health care provider a list of all the medicines, herbs, non-prescription drugs, or dietary supplements you use. Also tell them if you smoke, drink alcohol, or use illegal drugs. Some items may interact with your medicine. What should I watch for while using this medicine? Visit your doctor or health care professional for regular checks on your progress. If you are taking this medicine over a prolonged period, carry an identification card with your name and address, the type and dose of your medicine, and your doctor's name and address. This medicine may increase your risk of getting an infection. Tell your doctor or health care professional if you are around anyone with measles or chickenpox, or if you develop sores or blisters that do not heal properly. If you are going to have surgery, tell your doctor or health care professional that you have taken this medicine within the last twelve months. Ask your doctor or health care professional about your diet. You may need to lower the amount of salt you eat. This medicine may increase blood sugar. Ask your healthcare provider if changes in diet or medicines are needed if you have diabetes. What side effects may I notice from receiving this medicine? Side effects that you should report to your doctor or health care professional as soon as possible:  allergic reactions like skin rash, itching or hives, swelling of  the face, lips, or tongue  changes in emotions or moods  changes in vision  depressed mood  eye pain  fever or chills, cough, sore throat, pain or difficulty passing urine  signs and symptoms of high blood sugar such as being more thirsty or hungry or having to urinate more than normal. You may also feel very tired or have blurry vision.  swelling of ankles, feet Side effects that usually do not require medical attention (report to your doctor or health care professional if they continue or are bothersome):  confusion, excitement, restlessness  headache  nausea, vomiting  skin problems, acne, thin and shiny skin  trouble sleeping  weight gain This list may not describe all possible side effects. Call your doctor for medical advice about side effects. You may report side effects to FDA at 1-800-FDA-1088. Where should I keep my medicine? Keep out of the reach of children. Store at room temperature between 15 and 30 degrees C (59 and 86 degrees F). Protect from light. Keep container tightly closed. Throw away any unused medicine after the expiration date. NOTE: This sheet is a summary. It may not cover all possible information. If you have questions about this medicine, talk to your doctor, pharmacist, or health care  provider.  2020 Elsevier/Gold Standard (2018-05-12 10:54:22)

## 2019-08-31 ENCOUNTER — Ambulatory Visit: Payer: 59

## 2019-08-31 LAB — SEDIMENTATION RATE: Sed Rate: 9 mm/hr (ref 0–30)

## 2019-08-31 LAB — C-REACTIVE PROTEIN: CRP: <1 mg/L (ref 0–10)

## 2019-08-31 NOTE — Addendum Note (Signed)
Addended by: Lita Mains on: 08/31/2019 08:24 AM   Modules accepted: Orders

## 2019-08-31 NOTE — Progress Notes (Unsigned)
Renal ultrasound has been performed. Bilateral cysts were seen and evaluated.  Jimmy Octavio Matheney RDCS, RVT

## 2019-09-01 ENCOUNTER — Telehealth: Payer: Self-pay | Admitting: Emergency Medicine

## 2019-09-01 DIAGNOSIS — N2889 Other specified disorders of kidney and ureter: Secondary | ICD-10-CM

## 2019-09-01 DIAGNOSIS — N281 Cyst of kidney, acquired: Secondary | ICD-10-CM

## 2019-09-01 NOTE — Telephone Encounter (Signed)
Left message for patient to return call regarding results  

## 2019-09-02 NOTE — Telephone Encounter (Signed)
Called patient and informed him of ultrasound results. Advised him that we need to do a noncontrast CT of kidneys, will schedule tomorrow.

## 2019-09-02 NOTE — Telephone Encounter (Signed)
Patient returned call.. please cal him back [phone]

## 2019-09-02 NOTE — Addendum Note (Signed)
Addended by: Lita Mains on: 09/02/2019 05:03 PM   Modules accepted: Orders

## 2019-09-03 NOTE — Addendum Note (Signed)
Addended by: Lita Mains on: 09/03/2019 03:07 PM   Modules accepted: Orders

## 2019-09-03 NOTE — Telephone Encounter (Signed)
Scheduler notified the schedule ct.

## 2019-09-09 ENCOUNTER — Other Ambulatory Visit: Payer: Self-pay | Admitting: Cardiology

## 2019-09-13 ENCOUNTER — Ambulatory Visit (HOSPITAL_BASED_OUTPATIENT_CLINIC_OR_DEPARTMENT_OTHER): Payer: Medicare PPO

## 2019-09-22 ENCOUNTER — Other Ambulatory Visit: Payer: Self-pay

## 2019-09-22 ENCOUNTER — Ambulatory Visit (HOSPITAL_BASED_OUTPATIENT_CLINIC_OR_DEPARTMENT_OTHER)
Admission: RE | Admit: 2019-09-22 | Discharge: 2019-09-22 | Disposition: A | Discharge status: Home / Self Care | Payer: Medicare PPO | Source: Ambulatory Visit | Attending: Cardiology | Admitting: Cardiology

## 2019-09-22 DIAGNOSIS — N281 Cyst of kidney, acquired: Secondary | ICD-10-CM | POA: Diagnosis not present

## 2019-09-22 DIAGNOSIS — N2889 Other specified disorders of kidney and ureter: Secondary | ICD-10-CM | POA: Diagnosis present

## 2019-09-30 NOTE — Progress Notes (Signed)
Cardiology Office Note:    Date:  10/01/2019   ID:  Eugene Taylor, DOB May 02, 1951, MRN [id number]  PCP:  Olive Bass, MD  Cardiologist:  [name], MD    Referring MD: Olive Bass, MD    ASSESSMENT:    1. Recurrent idiopathic pericarditis   2. Coronary artery disease of native artery of native heart with stable angina pectoris (HCC)   3. Enlarged thoracic aorta (HCC)   4. Nonrheumatic aortic valve insufficiency   5. Cyst of kidney, acquired    PLAN:    In order of problems listed above:  1. We initiated steroids 1 month ago we will start a gradual taper over the next 3 months going to 20 mg a day for 1 month 10 mg a day for 1 month then 10 mg every other day for 1 month and discontinue.  With his CAD and benefit with chronic ischemic heart disease he will stay on colchicine and hopefully be able to stop ibuprofen after steroid withdrawal.  He is encouraged compliant and voices understanding.  As noted he had a cardiac MR showing some findings of chronic pericarditis but not extensive and has been seen by rheumatology and evaluated without findings of underlying connective tissue disease.  We will recheck inflammatory markers CBC CRP sedimentation rate today 2. Stable CAD approaching 1 year anniversary, transition from Brilinta to single agent clopidogrel in the anniversary and continue his medical therapy including intensive lipid-lowering 3. Stable consider reimaging his thoracic aorta next visit 4. Mild aortic stenosis regurgitation stable 5. He has had duplex as well as CT scan no high risk findings will likely recheck in 1 year.  He is asymptomatic and has had no hematuria and has normal renal function   Next appointment: 1 month   Medication Adjustments/Labs and Tests Ordered: Current medicines are reviewed at length with the patient today.  Concerns regarding medicines are outlined above.  Orders Placed This Encounter  Procedures  . C-reactive protein  .  Sedimentation rate  . CBC   Meds ordered this encounter  Medications  . predniSONE (DELTASONE) 20 MG tablet    Sig: Take 1 tablet (20 mg total) by mouth daily.    Dispense:  30 tablet    Refill:  4  . clopidogrel (PLAVIX) 75 MG tablet    Sig: Take 1 tablet (75 mg total) by mouth daily.    Dispense:  90 tablet    Refill:  3    Chief Complaint  Patient presents with  . Follow-up    For pericarditis    History of Present Illness:    Eugene Taylor is a 69 y.o. male with a hx of recurrent idiopathic pericarditis,  mild stenosis and mild regurgitation, mild enlargement of ascending thoracic aorta 19mm in Oct 2019, HTN, CAD with PCI and stent of proximal LFx 11/11/18. He was seen  03/10/2019 with pericarditis recurrence.  His initial episode was September 2017 he was seen by Dr. Wille Glaser, [facility] regional cardiology.  His recurrence initially was September 2019 and again February 2020 most recently July 2020.   He was last seen 08/30/2019 and after rheumatology evaluation and cardiac MR and was initiated on steroid therapy with prednisone 30 mg day for 4 weeks and a gradual 3 month taper.  His last CRP and sedimentation rate had normalized after severe elevation 2 to 4 months ago. 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  He has done well with steroids no side effects he is on reduced dose nonsteroidal anti-inflammatory drug as well as colchicine and has had no flare of pericarditis and no side effects.  CAD is stable no angina will transition from Brilinta to clopidogrel on the 1 year anniversary of PCI and stent.  No edema shortness of breath palpitation or syncope he has had no chest pain fever Past Medical History:  Diagnosis Date  . Acute idiopathic pericarditis 04/25/2016   Overview:  2017: hosp, EKG changes, colchicine/indomethacin  . Acute medial meniscal tear 12/15/2014  . Angina pectoris  ([place]) 05/05/2018  . Arthritis    LEFT SHOULDER  . Asthma   . Benign hypertension 11/27/2015  . Complication of anesthesia    " during a knee surgery , my bllod pressure was up & stayed up "  . Coronary artery disease   . External hemorrhoid, thrombosed 12/28/2015   Overview:  2017:   . History of adenomatous polyp of colon   . History of kidney stones   . Hyperlipidemia   . Hypertension   . Lower respiratory infection 02/08/2016  . Mixed hyperlipidemia 11/27/2015  . Obstructive sleep apnea 11/27/2015  . OSA on CPAP    uses cpap  . Right knee meniscal tear   . Screening for diabetes mellitus (DM) 11/27/2015  . Wears glasses     Past Surgical History:  Procedure Laterality Date  . APPENDECTOMY  age 79  . CARDIAC CATHETERIZATION  11/11/2018  . CHONDROPLASTY Right 12/15/2014   Procedure: CHONDROPLASTY;  Surgeon: Gaynelle Arabian, MD;  [facility]: [facility];  Service: Orthopedics;  Laterality: Right;  . COLONOSCOPY W/ POLYPECTOMY  2011  . CORONARY STENT INTERVENTION  11/11/2018  . CORONARY STENT INTERVENTION N/A 11/11/2018   Procedure: CORONARY STENT INTERVENTION;  Surgeon: Troy Sine, MD;  Location: [place] [facility];  Service: Cardiovascular;  Laterality: N/A;  . CYSTO/  URETEROSCOPIC STONE EXTRACTIONS  1995  . KNEE ARTHROSCOPY WITH LATERAL MENISECTOMY Right 12/15/2014   Procedure: KNEE ARTHROSCOPY WITH LATERAL MENISECTOMY;  Surgeon: Gaynelle Arabian, MD;  Location: [facility];  Service: Orthopedics;  Laterality: Right;  . KNEE ARTHROSCOPY WITH MEDIAL MENISECTOMY Right 12/15/2014   Procedure: KNEE ARTHROSCOPY WITH MEDIAL MENISECTOMY;  Surgeon: Gaynelle Arabian, MD;  Location: [facility];  Service: Orthopedics;  Laterality: Right;  . LEFT HEART CATH AND CORONARY ANGIOGRAPHY N/A 11/11/2018   Procedure: LEFT HEART CATH AND CORONARY ANGIOGRAPHY;  Surgeon: Troy Sine, MD;  Location: [facility];  Service: Cardiovascular;  Laterality: N/A;  .  NASAL SEPTUM SURGERY  1995    Current Medications: Current Meds  Medication Sig  . cholecalciferol (VITAMIN D3) 25 MCG (1000 UT) tablet Take 1,000 Units by mouth daily.  . clindamycin (CLEOCIN) 300 MG capsule Take 2 capsules (600 mg total) by mouth as directed. Take 2 capsules (600mg ) 45 minutes prior to dental visits  . colchicine 0.6 MG tablet TAKE 1 TABLET(0.6 MG) BY MOUTH TWICE DAILY  . EPIPEN 2-PAK 0.3 MG/0.3ML SOAJ injection Inject 0.3 mg into the muscle as needed for anaphylaxis (red meat).   Marland Kitchen ibuprofen (ADVIL) 800 MG tablet Take 0.5 tablets (400 mg total) by mouth 2 (two) times daily.  . nitroGLYCERIN (NITROSTAT) 0.4 MG SL tablet Place 1 tablet (0.4 mg total) under the tongue every 5 (five) minutes as needed for chest pain.  . predniSONE (DELTASONE) 20 MG tablet Take 1 tablet (20 mg total) by  mouth daily.  . rosuvastatin (CRESTOR) 20 MG tablet Take 20 mg by mouth daily.  Marland Kitchen telmisartan-hydrochlorothiazide (MICARDIS HCT) 40-12.5 MG tablet TAKE 1 TABLET ONCE DAILY.  . vitamin B-12 (CYANOCOBALAMIN) 1000 MCG tablet Take 1,000 mcg by mouth daily.  . [DISCONTINUED] BRILINTA 90 MG TABS tablet TAKE 1 TABLET BY MOUTH TWICE DAILY.  . [DISCONTINUED] predniSONE (DELTASONE) 20 MG tablet Take 1.5 tablets (30 mg total) by mouth daily.     Allergies:   Penicillins and Hydrocodeine [dihydrocodeine]   Social History   Socioeconomic History  . Marital status: Married    Spouse name: Not on file  . Number of children: Not on file  . Years of education: Not on file  . Highest education level: Not on file  Occupational History  . Not on file  Tobacco Use  . Smoking status: Never Smoker  . Smokeless tobacco: Never Used  Substance and Sexual Activity  . Alcohol use: No  . Drug use: No  . Sexual activity: Not on file  Other Topics Concern  . Not on file  Social History Narrative  . Not on file   Social Determinants of Health   Financial Resource Strain:   . Difficulty of Paying Living  Expenses: Not on file  Food Insecurity:   . Worried About Programme researcher, broadcasting/film/video in the Last Year: Not on file  . Ran Out of Food in the Last Year: Not on file  Transportation Needs:   . Lack of Transportation (Medical): Not on file  . Lack of Transportation (Non-Medical): Not on file  Physical Activity:   . Days of Exercise per Week: Not on file  . Minutes of Exercise per Session: Not on file  Stress:   . Feeling of Stress : Not on file  Social Connections:   . Frequency of Communication with Friends and Family: Not on file  . Frequency of Social Gatherings with Friends and Family: Not on file  . Attends Religious Services: Not on file  . Active Member of Clubs or Organizations: Not on file  . Attends Banker Meetings: Not on file  . Marital Status: Not on file     Family History: The patient's family history includes Pancreatic cancer in his father; Stomach cancer in his mother. ROS:   Please see the history of present illness.    All other systems reviewed and are negative.  EKGs/Labs/Other Studies Reviewed:    The following studies were reviewed today:  CT abdomen 09/22/2019 [facility] at [facility]; 5.3 cm medial left upper pole renal cyst (series 2/image 28), previously 4.3 cm. 2.7 cm lateral right lower pole renal cyst (series 2/image 43), unchanged. While these are technically in comparison the characterized in the absence of intravenous contrast, both measures simple fluid density on unenhanced CT.  2 mm nonobstructing right upper pole renal calculus (series 2/image 34). No hydronephrosis.  Recent Labs: 07/14/2019: ALT 72; BUN 24; Creatinine, Ser 1.27; Hemoglobin 14.7; Platelets 243; Potassium 4.0; Sodium 137  Recent Lipid Panel    Component Value Date/Time   CHOL 104 07/14/2019 1411   TRIG 285 (H) 07/14/2019 1411   HDL 34 (L) 07/14/2019 1411   CHOLHDL 3.1 07/14/2019 1411   LDLCALC 28 07/14/2019 1411    Physical Exam:    VS:  BP 124/82   Pulse 70    Temp 97.7 F (36.5 C)   Ht 5\' 11"  (1.803 m)   Wt 201 lb 9.6 oz (91.4 kg)   SpO2 99%  BMI 28.12 kg/m     Wt Readings from Last 3 Encounters:  10/01/19 201 lb 9.6 oz (91.4 kg)  08/30/19 207 lb (93.9 kg)  07/14/19 213 lb 3.2 oz (96.7 kg)     GEN:  Well nourished, well developed in no acute distress HEENT: Normal NECK: No JVD; No carotid bruits LYMPHATICS: No lymphadenopathy CARDIAC: RRR, no murmurs, rubs, gallops RESPIRATORY:  Clear to auscultation without rales, wheezing or rhonchi  ABDOMEN: Soft, non-tender, non-distended MUSCULOSKELETAL:  No edema; No deformity  SKIN: Warm and dry NEUROLOGIC:  Alert and oriented x 3 PSYCHIATRIC:  Normal affect    Signed, [name], MD  10/01/2019 9:44 AM    [facility]

## 2019-10-01 ENCOUNTER — Other Ambulatory Visit: Payer: Self-pay

## 2019-10-01 ENCOUNTER — Ambulatory Visit: Payer: Medicare PPO | Admitting: Cardiology

## 2019-10-01 ENCOUNTER — Encounter: Payer: Self-pay | Admitting: Cardiology

## 2019-10-01 VITALS — BP 124/82 | HR 70 | Temp 97.7°F | Ht 71.0 in | Wt 201.6 lb

## 2019-10-01 DIAGNOSIS — I25118 Atherosclerotic heart disease of native coronary artery with other forms of angina pectoris: Secondary | ICD-10-CM

## 2019-10-01 DIAGNOSIS — N281 Cyst of kidney, acquired: Secondary | ICD-10-CM

## 2019-10-01 DIAGNOSIS — I7789 Other specified disorders of arteries and arterioles: Secondary | ICD-10-CM

## 2019-10-01 DIAGNOSIS — I300 Acute nonspecific idiopathic pericarditis: Secondary | ICD-10-CM

## 2019-10-01 DIAGNOSIS — I351 Nonrheumatic aortic (valve) insufficiency: Secondary | ICD-10-CM | POA: Diagnosis not present

## 2019-10-01 MED ORDER — CLOPIDOGREL BISULFATE 75 MG PO TABS: 75.0000 mg | ORAL_TABLET | Freq: Every day | ORAL | 3 refills | Status: DC

## 2019-10-01 MED ORDER — PREDNISONE 20 MG PO TABS: 20.0000 mg | ORAL_TABLET | Freq: Every day | ORAL | 4 refills | Status: DC

## 2019-10-01 NOTE — Patient Instructions (Addendum)
Medication Instructions:  Your physician has recommended you make the following change in your medication:  STOP taking Brilinta on 11/11/19  START taking Plavix 75 mg (1 tablet) once daily  DECREASE Prednisone to 20 mg a day  *If you need a refill on your cardiac medications before your next appointment, please call your pharmacy*  Lab Work: Your physician recommends that you have a CBC, Sed rate and CRP drawn today  If you have labs (blood work) drawn today and your tests are completely normal, you will receive your results only by: Eugene Taylor MyChart Message (if you have MyChart) OR . A paper copy in the mail If you have any lab test that is abnormal or we need to change your treatment, we will call you to review the results.  Testing/Procedures: NONE  Follow-Up: At [facility], you and your health needs are our priority.  As part of our continuing mission to provide you with exceptional heart care, we have created designated Provider Care Teams.  These Care Teams include your primary Cardiologist (physician) and Advanced Practice Providers (APPs -  Physician Assistants and Nurse Practitioners) who all work together to provide you with the care you need, when you need it.  Your next appointment:   1 month(s)  The format for your next appointment:   In Person  Provider:   [name], MD  Other Instructions Clopidogrel tablets What is this medicine? CLOPIDOGREL (kloh PID oh grel) helps to prevent blood clots. This medicine is used to prevent heart attack, stroke, or other vascular events in people who are at high risk. This medicine may be used for other purposes; ask your health care provider or pharmacist if you have questions. COMMON BRAND NAME(S): Plavix What should I tell my health care provider before I take this medicine? They need to know if you have any of the following conditions:  bleeding disorders  bleeding in the brain  having surgery  history of stomach  bleeding  an unusual or allergic reaction to clopidogrel, other medicines, foods, dyes, or preservatives  pregnant or trying to get pregnant  breast-feeding How should I use this medicine? Take this medicine by mouth with a glass of water. Follow the directions on the prescription label. You may take this medicine with or without food. If it upsets your stomach, take it with food. Take your medicine at regular intervals. Do not take it more often than directed. Do not stop taking except on your doctor's advice. A special MedGuide will be given to you by the pharmacist with each prescription and refill. Be sure to read this information carefully each time. Talk to your pediatrician regarding the use of this medicine in children. Special care may be needed. Overdosage: If you think you have taken too much of this medicine contact a poison control center or emergency room at once. NOTE: This medicine is only for you. Do not share this medicine with others. What if I miss a dose? If you miss a dose, take it as soon as you can. If it is almost time for your next dose, take only that dose. Do not take double or extra doses. What may interact with this medicine? Do not take this medicine with the following medications:  dasabuvir; ombitasvir; paritaprevir; ritonavir  defibrotide  selexipag This medicine may also interact with the following medications:  certain medicines that treat or prevent blood clots like warfarin  narcotic medicines for pain  NSAIDs, medicines for pain and inflammation, like ibuprofen  or naproxen  repaglinide  SNRIs, medicines for depression, like desvenlafaxine, duloxetine, levomilnacipran, venlafaxine  SSRIs, medicines for depression, like citalopram, escitalopram, fluoxetine, fluvoxamine, paroxetine, sertraline  stomach acid blockers like cimetidine, esomeprazole, omeprazole This list may not describe all possible interactions. Give your health care provider a  list of all the medicines, herbs, non-prescription drugs, or dietary supplements you use. Also tell them if you smoke, drink alcohol, or use illegal drugs. Some items may interact with your medicine. What should I watch for while using this medicine? Visit your doctor or health care professional for regular check-ups. Do not stop taking your medicine unless your doctor tells you to. Notify your doctor or health care professional and seek emergency treatment if you develop breathing problems; changes in vision; chest pain; severe, sudden headache; pain, swelling, warmth in the leg; trouble speaking; sudden numbness or weakness of the face, arm or leg. These can be signs that your condition has gotten worse. If you are going to have surgery or dental work, tell your doctor or health care professional that you are taking this medicine. Certain genetic factors may reduce the effect of this medicine. Your doctor may use genetic tests to determine treatment. Only take aspirin if you are instructed to. Low doses of aspirin are used with this medicine to treat some conditions. Taking aspirin with this medicine can increase your risk of bleeding so you must be careful. Talk to your doctor or pharmacist if you have questions. What side effects may I notice from receiving this medicine? Side effects that you should report to your doctor or health care professional as soon as possible:  allergic reactions like skin rash, itching or hives, swelling of the face, lips, or tongue  signs and symptoms of bleeding such as bloody or black, tarry stools; red or dark-brown urine; spitting up blood or brown material that looks like coffee grounds; red spots on the skin; unusual bruising or bleeding from the eye, gums, or nose  signs and symptoms of a blood clot such as breathing problems; changes in vision; chest pain; severe, sudden headache; pain, swelling, warmth in the leg; trouble speaking; sudden numbness or weakness  of the face, arm or leg  signs and symptoms of low blood sugar such as feeling anxious; confusion; dizziness; increased hunger; unusually weak or tired; increased sweating; shakiness; cold, clammy skin; irritable; headache; blurred vision; fast heartbeat; loss of consciousness Side effects that usually do not require medical attention (report to your doctor or health care professional if they continue or are bothersome):  constipation  diarrhea  headache  upset stomach This list may not describe all possible side effects. Call your doctor for medical advice about side effects. You may report side effects to FDA at 1-800-FDA-1088. Where should I keep my medicine? Keep out of the reach of children. Store at room temperature of 59 to 86 degrees F (15 to 30 degrees C). Throw away any unused medicine after the expiration date. NOTE: This sheet is a summary. It may not cover all possible information. If you have questions about this medicine, talk to your doctor, pharmacist, or health care provider.  2020 Elsevier/Gold Standard (2018-01-12 15:03:38)

## 2019-10-02 LAB — CBC
Hematocrit: 49.1 % (ref 37.5–51.0)
Hemoglobin: 17.4 g/dL (ref 13.0–17.7)
MCH: 31.9 pg (ref 26.6–33.0)
MCHC: 35.4 g/dL (ref 31.5–35.7)
MCV: 90 fL (ref 79–97)
Platelets: 221 10*3/uL (ref 150–450)
RBC: 5.46 x10E6/uL (ref 4.14–5.80)
RDW: 15.1 % (ref 11.6–15.4)
WBC: 9.2 10*3/uL (ref 3.4–10.8)

## 2019-10-02 LAB — C-REACTIVE PROTEIN: CRP: <1 mg/L (ref 0–10)

## 2019-10-02 LAB — SEDIMENTATION RATE: Sed Rate: 8 mm/hr (ref 0–30)

## 2019-10-15 ENCOUNTER — Telehealth: Payer: Self-pay | Admitting: Cardiology

## 2019-10-15 DIAGNOSIS — R52 Pain, unspecified: Secondary | ICD-10-CM

## 2019-10-15 DIAGNOSIS — R350 Frequency of micturition: Secondary | ICD-10-CM

## 2019-10-15 DIAGNOSIS — R631 Polydipsia: Secondary | ICD-10-CM

## 2019-10-15 DIAGNOSIS — Z131 Encounter for screening for diabetes mellitus: Secondary | ICD-10-CM

## 2019-10-15 NOTE — Telephone Encounter (Signed)
  Patient is calling because Dr Dulce Sellar is having him come off the prednisone and he is having frequent urination, cramps, body aches and not sleeping well, also thirsty all the time. He has concerns about the diabetic factors of this medication and would like to know if Dr Dulce Sellar thinks he should be concerned or if he has any suggestions for him.

## 2019-10-15 NOTE — Telephone Encounter (Signed)
Routed to MD to advise. Med dose was decreased at last visit

## 2019-10-16 NOTE — Telephone Encounter (Signed)
I called him  Needs Hgb A1C office Monday and prescription for a glucometer to his pharmacy

## 2019-10-18 MED ORDER — FREESTYLE SYSTEM KIT: 1.0000 | PACK | 0 refills | Status: AC | PRN

## 2019-10-18 NOTE — Telephone Encounter (Signed)
Ordered lab & glucose monitoring kit. notified patient via [place]

## 2019-10-19 LAB — HEMOGLOBIN A1C
Est. average glucose Bld gHb Est-mCnc: 295 mg/dL
Hgb A1c MFr Bld: 11.9 % — ABNORMAL HIGH (ref 4.8–5.6)

## 2019-10-19 NOTE — Telephone Encounter (Signed)
New Message  Pt returning call regarding Dr. Hulen Shouts prescription for Freedom freestyle light, according to pt's pharmacy they need pre-authorization from Dr. Dulce Sellar. Pt also added that he can choose either freedom freestyle, acucheck or true metrics.  Please advice

## 2019-10-20 ENCOUNTER — Telehealth: Payer: Self-pay

## 2019-10-20 NOTE — Telephone Encounter (Signed)
The patient has been notified of the lab result and verbalized understanding.  All questions (if any) were answered. Eugene Schmidt, RN 10/20/2019 9:07 AM   The patient said that the Glucose monitoring Kit (Freestyle) ordered by Dr Bettina Gavia is not covered by his insurance and needs an alternative.  Please advise, thank you

## 2019-10-20 NOTE — Telephone Encounter (Signed)
Telephone call to patient. Spoke with wife. Informed her the need to see Dr Sol Passer to intiate therapy and get recommendations on glucose monitoring systems.

## 2019-10-20 NOTE — Telephone Encounter (Signed)
-----   Message from Baldo Daub, MD sent at 10/19/2019 11:07 AM EST ----- Normal or stable result  His A1c is quite elevated consistent with diabetes suspect his blood sugars are greater than 200% I think the best thing to do is to see his PCP Dr. Sol Passer to initiate therapy.

## 2019-11-08 ENCOUNTER — Telehealth: Payer: Self-pay | Admitting: Cardiology

## 2019-11-08 MED ORDER — COLCHICINE 0.6 MG PO CAPS: 0.6000 mg | ORAL_CAPSULE | Freq: Every day | ORAL | 3 refills | Status: DC

## 2019-11-08 NOTE — Telephone Encounter (Signed)
Pt c/o medication issue:  1. Name of Medication: colchicine 0.6 MG tablet  2. How are you currently taking this medication (dosage and times per day)? As directed  3. Are you having a reaction (difficulty breathing--STAT)? no  4. What is your medication issue? Patient switched insurance at the beginning of the year. He states that his pharmacy would like him to take the generic medication, Mitigare. He states he is about to run out of Colchicine so would like to know what to do.

## 2019-11-08 NOTE — Telephone Encounter (Signed)
It is a trade colchicine and is the same. Send a script if needed.

## 2019-11-08 NOTE — Telephone Encounter (Signed)
Spoke with patient who states that the pharmacy told him that his insurance would not cover colchicine but they would cover Mitigare. Patient was confused and thought this was a different medication. He wants to make sure that this switch is okay.  I called the pharmacy to verify information. His insurance will not cover the colchicine tablets but it will cover the Mitigare capsules.

## 2019-11-11 ENCOUNTER — Other Ambulatory Visit: Payer: Self-pay | Admitting: Cardiology

## 2019-11-12 ENCOUNTER — Telehealth: Payer: Self-pay | Admitting: Cardiology

## 2019-11-12 MED ORDER — PREDNISONE 10 MG PO TABS: 10.0000 mg | ORAL_TABLET | Freq: Every day | ORAL | 0 refills | Status: DC

## 2019-11-12 NOTE — Telephone Encounter (Signed)
   Pt c/o medication issue:  1. Name of Medication:   predniSONE (DELTASONE) 20 MG tablet    2. How are you currently taking this medication (dosage and times per day)? Take 1 tablet (20 mg total) by mouth daily.  3. Are you having a reaction (difficulty breathing--STAT)?   4. What is your medication issue? Pt needs refill request sent out for prednisone with new dosage of half a tablet a day for 30 days     Please advise

## 2019-11-12 NOTE — Telephone Encounter (Signed)
Left message to call back  

## 2019-11-12 NOTE — Telephone Encounter (Signed)
Per last office note patient has been taking prednisone 20 mg daily and after a month will decrease to 10 mg daily. After this plan will be to go to 10 mg every other day.  I spoke with patient who confirms these instructions.  Patient would prefer 10 mg tablets so he does not have to break in half.  Patient is seeing Dr Dulce Sellar on March 22,2021.  I told patient I would send prescription for one month supply of 10 mg tablets daily and if plan at office visit is to still decrease to every other day a new prescription could be sent in during office visit.

## 2019-11-14 NOTE — Progress Notes (Signed)
Cardiology Office Note:    Date:  11/15/2019   ID:  ERVAN HEBER, DOB 02-24-1951, MRN [id number]  PCP:  Algis Greenhouse, MD  Cardiologist:  Shirlee More, MD    Referring MD: Algis Greenhouse, MD    ASSESSMENT:    1. Recurrent idiopathic pericarditis   2. Type 2 diabetes mellitus without complication, without long-term current use of insulin ([place])   3. Coronary artery disease of native artery of native heart with stable angina pectoris ([place])   4. Mixed hyperlipidemia   5. High risk medication use    PLAN:    In order of problems listed above:  1. Stable down to 10 mg a day of prednisone will discontinue with 30 days.  I will see back in the office in 6 weeks and address his steroid keep him on long-term colchicine with his CAD 2. Improved glucose less than 100 fasting continuous combined Metformin SGLT2 inhibitor 3. Hypertension worsened discontinue thiazide diuretic decrease his ARB 4. Continue lipid-lowering treatment LDL at target 5. Stable CAD continue current medical treatment New York Heart Association class I   Next appointment: 6 weeks   Medication Adjustments/Labs and Tests Ordered: Current medicines are reviewed at length with the patient today.  Concerns regarding medicines are outlined above.  Orders Placed This Encounter  Procedures  . C-reactive protein  . Comp Met (CMET)  . Sedimentation rate   Meds ordered this encounter  Medications  . telmisartan (MICARDIS) 20 MG tablet    Sig: Take 1 tablet (20 mg total) by mouth daily.    Dispense:  30 tablet    Refill:  3    No chief complaint on file.    History of Present Illness:    Eugene Taylor is a 69 y.o. male with a hx of  recurrent idiopathic pericarditis,  mild stenosis and mild regurgitation, mild enlargement of ascending thoracic aorta 60m in Oct 2019, HTN, CAD with PCI and stent of proximal LFx 11/11/18. He was seen  03/10/2019 with pericarditis recurrence.  His initial episode was September  2017 he [facility]  His recurrence initially was September 2019 and again February 2020 most recently July 2020.   He was seen 08/30/2019 and after rheumatology evaluation and cardiac MR and was initiated on steroid therapy with prednisone 30 mg day for 4 weeks and a gradual 3 month taper.   He was last seen 10/01/2019.  In the interim he has developed type 2 diabetes in the setting of steroid therapy and initiated on Metformin and Jardiance. Compliance with diet, lifestyle and medications:yes   1 mo ago  (10/01/19) 2 mo ago  (08/30/19) 4 mo ago  (07/14/19) 4 mo ago  (06/22/19)   Sed Rate 0 - 30 mm/hr _0 46High      1 mo ago  (10/01/19) 2 mo ago  (08/30/19) 4 mo ago  (07/14/19) 4 mo ago  (06/22/19)   CRP 0 - 10 mg/L <1  <1  <1  32High    Since initiation of diabetic treatment has lost greater than 20 pounds and has had blood pressure less than 1270systolic.  As he takes SGLT2 inhibitor we will stop his thiazide diuretic and reduce his ACE inhibitor to 50% will call me if systolics remain less than 110.  He is having no pleuritic discomfort he is down to 10 mg a day of prednisone.  Concerned about numbness and tingling  in his fingers think 6 B12 deficiency from Metformin taking over-the-counter supplement.  No edema shortness of breath chest pain palpitation or syncope. Past Medical History:  Diagnosis Date  . Abnormal cardiac CT angiography   . Acute idiopathic pericarditis 04/25/2016   Overview:  2017: hosp, EKG changes, colchicine/indomethacin  . Acute medial meniscal tear 12/15/2014  . Angina pectoris ([place]) 05/05/2018  . Aortic regurgitation 07/07/2018  . Aortic stenosis, mild 07/07/2018  . Arthritis    LEFT SHOULDER  . Asthma   . Benign hypertension 11/27/2015  . Bicuspid aortic valve 05/22/2018  . CAD in native artery 11/11/2018  . Complication of anesthesia    " during a knee surgery , my bllod pressure was up & stayed up "  . Coronary artery  disease   . Coronary artery disease of native artery of native heart with stable angina pectoris ([place]) 11/09/2018  . Enlarged thoracic aorta ([place]) 05/22/2018  . External hemorrhoid, thrombosed 12/28/2015   Overview:  2017:   . History of adenomatous polyp of colon   . History of kidney stones   . Hyperlipidemia   . Hypertension   . Lower respiratory infection 02/08/2016  . Mixed hyperlipidemia 11/27/2015  . Obstructive sleep apnea 11/27/2015  . OSA on CPAP    uses cpap  . 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 Right knee meniscal tear   . Screening for diabetes mellitus (DM) 11/27/2015  . Wears glasses     Past Surgical History:  Procedure Laterality Date  . APPENDECTOMY  age 42  . CARDIAC CATHETERIZATION  11/11/2018  . CHONDROPLASTY Right 12/15/2014   Procedure: CHONDROPLASTY;  Surgeon: Gaynelle Arabian, MD;  Location: [facility];  Service: Orthopedics;  Laterality: Right;  . COLONOSCOPY W/ POLYPECTOMY  2011  . CORONARY STENT INTERVENTION  11/11/2018  . CORONARY STENT INTERVENTION N/A 11/11/2018   Procedure: CORONARY STENT INTERVENTION;  Surgeon: [place] Sine, MD;  Location: [place] [facility];  Service: Cardiovascular;  Laterality: N/A;  . CYSTO/  URETEROSCOPIC STONE EXTRACTIONS  1995  . KNEE ARTHROSCOPY WITH LATERAL MENISECTOMY Right 12/15/2014   Procedure: KNEE ARTHROSCOPY WITH LATERAL MENISECTOMY;  Surgeon: Gaynelle Arabian, MD;  Location: [facility];  Service: Orthopedics;  Laterality: Right;  . KNEE ARTHROSCOPY WITH MEDIAL MENISECTOMY Right 12/15/2014   Procedure: KNEE ARTHROSCOPY WITH MEDIAL MENISECTOMY;  Surgeon: Gaynelle Arabian, MD;  Location[facility];  Service: Orthopedics;  Laterality: Right;  . LEFT HEART CATH AND CORONARY ANGIOGRAPHY N/A 11/11/2018   Procedure: LEFT HEART CATH AND CORONARY ANGIOGRAPHY;  Surgeon: [place] Sine, MD;  Location: [place] [facility];  Service: Cardiovascular;  Laterality: N/A;  . NASAL SEPTUM  SURGERY  1995    Current Medications: Current Meds  Medication Sig  . b complex vitamins tablet Take 1 tablet by mouth daily.  . cholecalciferol (VITAMIN D3) 25 MCG (1000 UT) tablet Take 1,000 Units by mouth daily.  . clindamycin (CLEOCIN) 300 MG capsule Take 2 capsules (600 mg total) by mouth as directed. Take 2 capsules ('600mg'$ ) 45 minutes prior to dental visits  . clopidogrel (PLAVIX) 75 MG tablet Take 1 tablet (75 mg total) by mouth daily.  . Colchicine (MITIGARE) 0.6 MG CAPS Take 0.6 mg by mouth daily.  Marland Kitchen EPIPEN 2-PAK 0.3 MG/0.3ML SOAJ injection Inject 0.3 mg into the muscle as needed for anaphylaxis (red meat).   Marland Kitchen glucose monitoring kit (FREESTYLE) monitoring kit 1 each by Does not apply route as needed for other.  . ibuprofen (  ADVIL) 800 MG tablet Take 0.5 tablets (400 mg total) by mouth 2 (two) times daily.  Marland Kitchen JARDIANCE 25 MG TABS tablet Take 25 mg by mouth daily.  . metFORMIN (GLUCOPHAGE) 1000 MG tablet Take 1 tablet by mouth daily.  . nitroGLYCERIN (NITROSTAT) 0.4 MG SL tablet Place 1 tablet (0.4 mg total) under the tongue every 5 (five) minutes as needed for chest pain.  . predniSONE (DELTASONE) 10 MG tablet Take 1 tablet (10 mg total) by mouth daily.  . rosuvastatin (CRESTOR) 20 MG tablet Take 20 mg by mouth daily.  . [DISCONTINUED] telmisartan-hydrochlorothiazide (MICARDIS HCT) 40-12.5 MG tablet TAKE 1 TABLET ONCE DAILY.     Allergies:   Penicillins and Hydrocodeine [dihydrocodeine]   Social History   Socioeconomic History  . Marital status: Married    Spouse name: Not on file  . Number of children: Not on file  . Years of education: Not on file  . Highest education level: Not on file  Occupational History  . Not on file  Tobacco Use  . Smoking status: Never Smoker  . Smokeless tobacco: Never Used  Substance and Sexual Activity  . Alcohol use: No  . Drug use: No  . Sexual activity: Not on file  Other Topics Concern  . Not on file  Social History Narrative  .  Not on file   Social Determinants of Health   Financial Resource Strain:   . Difficulty of Paying Living Expenses:   Food Insecurity:   . Worried About Charity fundraiser in the Last Year:   . Arboriculturist in the Last Year:   Transportation Needs:   . Film/video editor (Medical):   Marland Kitchen Lack of Transportation (Non-Medical):   Physical Activity:   . Days of Exercise per Week:   . Minutes of Exercise per Session:   Stress:   . Feeling of Stress :   Social Connections:   . Frequency of Communication with Friends and Family:   . Frequency of Social Gatherings with Friends and Family:   . Attends Religious Services:   . Active Member of Clubs or Organizations:   . Attends Archivist Meetings:   Marland Kitchen Marital Status:      Family History: The patient's family history includes Pancreatic cancer in his father; Stomach cancer in his mother. ROS:   Please see the history of present illness.    All other systems reviewed and are negative.  EKGs/Labs/Other Studies Reviewed:    The following studies were reviewed today:     Ref Range & Units 4 wk ago  Hgb A1c MFr Bld 4.8 - 5.6 % 11.9High    Recent Labs: 07/14/2019: ALT 72; BUN 24; Creatinine, Ser 1.27; Potassium 4.0; Sodium 137 10/01/2019: Hemoglobin 17.4; Platelets 221  Recent Lipid Panel    Component Value Date/Time   CHOL 104 07/14/2019 1411   TRIG 285 (H) 07/14/2019 1411   HDL 34 (L) 07/14/2019 1411   CHOLHDL 3.1 07/14/2019 1411   LDLCALC 28 07/14/2019 1411    Physical Exam:    VS:  BP 98/60   Pulse 85   Ht '5\' 11"'$  (1.803 m)   Wt 193 lb (87.5 kg)   SpO2 (!) 9%   BMI 26.92 kg/m     Wt Readings from Last 3 Encounters:  11/15/19 193 lb (87.5 kg)  10/01/19 201 lb 9.6 oz (91.4 kg)  08/30/19 207 lb (93.9 kg)     GEN:  Well nourished, well developed in  no acute distress HEENT: Normal NECK: No JVD; No carotid bruits LYMPHATICS: No lymphadenopathy CARDIAC: No rub RRR, no murmurs, rubs,  gallops RESPIRATORY:  Clear to auscultation without rales, wheezing or rhonchi  ABDOMEN: Soft, non-tender, non-distended MUSCULOSKELETAL:  No edema; No deformity  SKIN: Warm and dry NEUROLOGIC:  Alert and oriented x 3 PSYCHIATRIC:  Normal affect    Signed, Shirlee More, MD  11/15/2019 11:20 AM    [place]

## 2019-11-15 ENCOUNTER — Ambulatory Visit: Payer: Medicare PPO | Admitting: Cardiology

## 2019-11-15 ENCOUNTER — Other Ambulatory Visit: Payer: Self-pay

## 2019-11-15 ENCOUNTER — Encounter: Payer: Self-pay | Admitting: Cardiology

## 2019-11-15 VITALS — BP 98/60 | HR 85 | Ht 71.0 in | Wt 193.0 lb

## 2019-11-15 DIAGNOSIS — E782 Mixed hyperlipidemia: Secondary | ICD-10-CM | POA: Diagnosis not present

## 2019-11-15 DIAGNOSIS — I25118 Atherosclerotic heart disease of native coronary artery with other forms of angina pectoris: Secondary | ICD-10-CM

## 2019-11-15 DIAGNOSIS — I300 Acute nonspecific idiopathic pericarditis: Secondary | ICD-10-CM

## 2019-11-15 DIAGNOSIS — E119 Type 2 diabetes mellitus without complications: Secondary | ICD-10-CM

## 2019-11-15 DIAGNOSIS — Z79899 Other long term (current) drug therapy: Secondary | ICD-10-CM

## 2019-11-15 MED ORDER — TELMISARTAN 20 MG PO TABS: 20.0000 mg | ORAL_TABLET | Freq: Every day | ORAL | 3 refills | Status: DC

## 2019-11-15 NOTE — Patient Instructions (Signed)
Medication Instructions:  Your physician has recommended you make the following change in your medication:  1. START Telmisartan 20 mg take one tablet by mouth daily. 2. STOP Telmisartan/ HCTZ. 3. DECREASE Prednisone to 10 mg daily. *If you need a refill on your cardiac medications before your next appointment, please call your pharmacy*   Lab Work: Your physician recommends that you return for lab work in: TODAY CRP, Sed Rate, CMP If you have labs (blood work) drawn today and your tests are completely normal, you will receive your results only by: Marland Kitchen MyChart Message (if you have MyChart) OR . A paper copy in the mail If you have any lab test that is abnormal or we need to change your treatment, we will call you to review the results.   Testing/Procedures: None   Follow-Up: At [facility], you and your health needs are our priority.  As part of our continuing mission to provide you with exceptional heart care, we have created designated Provider Care Teams.  These Care Teams include your primary Cardiologist (physician) and Advanced Practice Providers (APPs -  Physician Assistants and Nurse Practitioners) who all work together to provide you with the care you need, when you need it.  We recommend signing up for the patient portal called "MyChart".  Sign up information is provided on this After Visit Summary.  MyChart is used to connect with patients for Virtual Visits (Telemedicine).  Patients are able to view lab/test results, encounter notes, upcoming appointments, etc.  Non-urgent messages can be sent to your provider as well.   To learn more about what you can do with MyChart, go to [email or web address].    Your next appointment:   6 week(s)  The format for your next appointment:   In Person  Provider:   [name], MD   Other Instructions

## 2019-11-16 ENCOUNTER — Telehealth: Payer: Self-pay

## 2019-11-16 LAB — COMPREHENSIVE METABOLIC PANEL
ALT: 37 IU/L (ref 0–44)
AST: 25 IU/L (ref 0–40)
Albumin/Globulin Ratio: 2 (ref 1.2–2.2)
Albumin: 4.7 g/dL (ref 3.8–4.8)
Alkaline Phosphatase: 53 IU/L (ref 39–117)
BUN/Creatinine Ratio: 23 (ref 10–24)
BUN: 26 mg/dL (ref 8–27)
Bilirubin Total: 0.6 mg/dL (ref 0.0–1.2)
CO2: 22 mmol/L (ref 20–29)
Calcium: 9.8 mg/dL (ref 8.6–10.2)
Chloride: 102 mmol/L (ref 96–106)
Creatinine, Ser: 1.14 mg/dL (ref 0.76–1.27)
GFR calc Af Amer: 77 mL/min/{1.73_m2} (ref >59)
GFR calc non Af Amer: 67 mL/min/{1.73_m2} (ref >59)
Globulin, Total: 2.3 g/dL (ref 1.5–4.5)
Glucose: 101 mg/dL — ABNORMAL HIGH (ref 65–99)
Potassium: 4.1 mmol/L (ref 3.5–5.2)
Sodium: 139 mmol/L (ref 134–144)
Total Protein: 7 g/dL (ref 6.0–8.5)

## 2019-11-16 LAB — SEDIMENTATION RATE: Sed Rate: 14 mm/hr (ref 0–30)

## 2019-11-16 LAB — C-REACTIVE PROTEIN: CRP: <1 mg/L (ref 0–10)

## 2019-11-16 NOTE — Telephone Encounter (Signed)
-----   Message from Baldo Daub, MD sent at 11/16/2019  7:49 AM EDT ----- Normal or stable result  No changes

## 2019-11-16 NOTE — Telephone Encounter (Signed)
The patient has been notified of the lab result and verbalized understanding.  All questions (if any) were answered. Sigurd Sos, RN 11/16/2019 10:41 AM

## 2019-12-24 NOTE — Progress Notes (Signed)
Cardiology Office Note:    Date:  12/27/2019   ID:  Eugene Taylor, DOB 1951-05-16, MRN [id number]  PCP:  Algis Greenhouse, MD  Cardiologist:  Shirlee More, MD    Referring MD: Algis Greenhouse, MD    ASSESSMENT:    1. Recurrent idiopathic pericarditis   2. Coronary artery disease of native artery of native heart with stable angina pectoris ([place])   3. Mixed hyperlipidemia   4. Essential hypertension    PLAN:    In order of problems listed above:  1. Stable he is finishes steroids continue colchicine and low-dose nonsteroidal anti-inflammatory drug.  Recheck sedimentation rate CRP 2. Stable CAD more than 1 year post PCI remain on clopidogrel can withdraw if needed for shoulder replacement 3. Stable continue statin recheck liver function lipid profile 4. Reduce ARB with weight loss 10 mg telmisartan per day no longer takes a diuretic.   Next appointment: 3 months   Medication Adjustments/Labs and Tests Ordered: Current medicines are reviewed at length with the patient today.  Concerns regarding medicines are outlined above.  No orders of the defined types were placed in this encounter.  No orders of the defined types were placed in this encounter.   Chief Complaint  Patient presents with  . Follow-up    Pericarditis presently off corticosteroids    History of Present Illness:    Eugene Taylor is a 69 y.o. male with a hx of recurrent idiopathic pericarditis, mild aortic stenosis and regurgitation mild enlargement of the ascending aorta, hypertension, CAD with PCI and stent of proximal left circumflex 11/11/2018.  Because of recurrent and relapsing pericarditis after evaluation by rheumatology was initiated on steroid therapy with a 3 months gradual taper.  He also developed type 2 diabetes mellitus.  He was last seen 11/15/2019 at prednisone 10 mg/day with intent to discontinue in 30 days.  His last sedimentation rate remained normal at 14 and C-reactive protein remains less  than 1.  He is off prednisone. Compliance with diet, lifestyle and medications: Yes  He has done well with steroids he said no recurrent pericarditis his diabetes is well controlled fasting blood sugars less than 100 he has intermittent lightheadedness will reduce the dose of his ARB knee inquires about having shoulder replacement therapy is unfortunately joint pain is worse off steroids.  Today we will recheck C-reactive protein sedimentation rate continue colchicine and ibuprofen. Past Medical History:  Diagnosis Date  . Abnormal cardiac CT angiography   . Acute idiopathic pericarditis 04/25/2016   Overview:  2017: hosp, EKG changes, colchicine/indomethacin  . Acute medial meniscal tear 12/15/2014  . Angina pectoris ([place]) 05/05/2018  . Aortic regurgitation 07/07/2018  . Aortic stenosis, mild 07/07/2018  . Arthritis    LEFT SHOULDER  . Asthma   . Benign hypertension 11/27/2015  . Bicuspid aortic valve 05/22/2018  . CAD in native artery 11/11/2018  . Complication of anesthesia    " during a knee surgery , my bllod pressure was up & stayed up "  . Coronary artery disease   . Coronary artery disease of native artery of native heart with stable angina pectoris ([place]) 11/09/2018  . Enlarged thoracic aorta ([place]) 05/22/2018  . External hemorrhoid, thrombosed 12/28/2015   Overview:  2017:   . History of adenomatous polyp of colon   . History of kidney stones   . Hyperlipidemia   . Hypertension   . Lower respiratory infection 02/08/2016  . Mixed hyperlipidemia 11/27/2015  . Obstructive sleep apnea  11/27/2015  . OSA on CPAP    uses cpap  . 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 Right knee meniscal tear   . Screening for diabetes mellitus (DM) 11/27/2015  . Wears glasses     Past Surgical History:  Procedure Laterality Date  . APPENDECTOMY  age 89  . CARDIAC CATHETERIZATION  11/11/2018  . CHONDROPLASTY Right 12/15/2014   Procedure: CHONDROPLASTY;  Surgeon: Gaynelle Arabian, MD;  Location: [facility];  Service: Orthopedics;  Laterality: Right;  . COLONOSCOPY W/ POLYPECTOMY  2011  . CORONARY STENT INTERVENTION  11/11/2018  . CORONARY STENT INTERVENTION N/A 11/11/2018   Procedure: CORONARY STENT INTERVENTION;  Surgeon: [place] Sine, MD;  Location: [place] [facility];  Service: Cardiovascular;  Laterality: N/A;  . CYSTO/  URETEROSCOPIC STONE EXTRACTIONS  1995  . KNEE ARTHROSCOPY WITH LATERAL MENISECTOMY Right 12/15/2014   Procedure: KNEE ARTHROSCOPY WITH LATERAL MENISECTOMY;  Surgeon: Gaynelle Arabian, MD;  Location: [facility];  Service: Orthopedics;  Laterality: Right;  . KNEE ARTHROSCOPY WITH MEDIAL MENISECTOMY Right 12/15/2014   Procedure: KNEE ARTHROSCOPY WITH MEDIAL MENISECTOMY;  Surgeon: Gaynelle Arabian, MD;  Location: [facility];  Service: Orthopedics;  Laterality: Right;  . LEFT HEART CATH AND CORONARY ANGIOGRAPHY N/A 11/11/2018   Procedure: LEFT HEART CATH AND CORONARY ANGIOGRAPHY;  Surgeon: [place] Sine, MD;  Location: [place] [facility];  Service: Cardiovascular;  Laterality: N/A;  . NASAL SEPTUM SURGERY  1995    Current Medications: Current Meds  Medication Sig  . b complex vitamins tablet Take 1 tablet by mouth daily.  . cholecalciferol (VITAMIN D3) 25 MCG (1000 UT) tablet Take 1,000 Units by mouth daily.  . clopidogrel (PLAVIX) 75 MG tablet Take 1 tablet (75 mg total) by mouth daily.  . Colchicine (MITIGARE) 0.6 MG CAPS Take 0.6 mg by mouth daily.  Marland Kitchen EPIPEN 2-PAK 0.3 MG/0.3ML SOAJ injection Inject 0.3 mg into the muscle as needed for anaphylaxis (red meat).   Marland Kitchen glucose monitoring kit (FREESTYLE) monitoring kit 1 each by Does not apply route as needed for other.  . ibuprofen (ADVIL) 800 MG tablet Take 0.5 tablets (400 mg total) by mouth 2 (two) times daily.  Marland Kitchen JARDIANCE 25 MG TABS tablet Take 25 mg by mouth daily.  . metFORMIN (GLUCOPHAGE) 1000 MG tablet Take 1 tablet by mouth daily.     Allergies:   Penicillins and  Hydrocodeine [dihydrocodeine]   Social History   Socioeconomic History  . Marital status: Married    Spouse name: Not on file  . Number of children: Not on file  . Years of education: Not on file  . Highest education level: Not on file  Occupational History  . Not on file  Tobacco Use  . Smoking status: Never Smoker  . Smokeless tobacco: Never Used  Substance and Sexual Activity  . Alcohol use: No  . Drug use: No  . Sexual activity: Not on file  Other Topics Concern  . Not on file  Social History Narrative  . Not on file   Social Determinants of Health   Financial Resource Strain:   . Difficulty of Paying Living Expenses:   Food Insecurity:   . Worried About Charity fundraiser in the Last Year:   . Arboriculturist in the Last Year:   Transportation Needs:   . Film/video editor (Medical):   Marland Kitchen Lack of Transportation (Non-Medical):   Physical Activity:   . Days of Exercise per  Week:   . Minutes of Exercise per Session:   Stress:   . Feeling of Stress :   Social Connections:   . Frequency of Communication with Friends and Family:   . Frequency of Social Gatherings with Friends and Family:   . Attends Religious Services:   . Active Member of Clubs or Organizations:   . Attends Archivist Meetings:   Marland Kitchen Marital Status:      Family History: The patient's family history includes Pancreatic cancer in his father; Stomach cancer in his mother. ROS:   Please see the history of present illness.    All other systems reviewed and are negative.  EKGs/Labs/Other Studies Reviewed:    The following studies were reviewed today:    Recent Labs: 10/01/2019: Hemoglobin 17.4; Platelets 221 11/15/2019: ALT 37; BUN 26; Creatinine, Ser 1.14; Potassium 4.1; Sodium 139  Recent Lipid Panel    Component Value Date/Time   CHOL 104 07/14/2019 1411   TRIG 285 (H) 07/14/2019 1411   HDL 34 (L) 07/14/2019 1411   CHOLHDL 3.1 07/14/2019 1411   LDLCALC 28 07/14/2019 1411     Physical Exam:    VS:  BP 102/64   Pulse 76   Temp (!) 97.3 F (36.3 C)   Ht 5' 11"  (1.803 m)   Wt 188 lb 6.4 oz (85.5 kg)   SpO2 96%   BMI 26.28 kg/m     Wt Readings from Last 3 Encounters:  12/27/19 188 lb 6.4 oz (85.5 kg)  11/15/19 193 lb (87.5 kg)  10/01/19 201 lb 9.6 oz (91.4 kg)     GEN:  Well nourished, well developed in no acute distress HEENT: Normal NECK: No JVD; No carotid bruits LYMPHATICS: No lymphadenopathy CARDIAC: No rub RRR, no murmurs, rubs, gallops RESPIRATORY:  Clear to auscultation without rales, wheezing or rhonchi  ABDOMEN: Soft, non-tender, non-distended MUSCULOSKELETAL:  No edema; No deformity  SKIN: Warm and dry NEUROLOGIC:  Alert and oriented x 3 PSYCHIATRIC:  Normal affect    Signed, Shirlee More, MD  12/27/2019 9:51 AM    [place]

## 2019-12-27 ENCOUNTER — Other Ambulatory Visit: Payer: Self-pay

## 2019-12-27 ENCOUNTER — Ambulatory Visit: Payer: Medicare PPO | Admitting: Cardiology

## 2019-12-27 ENCOUNTER — Encounter: Payer: Self-pay | Admitting: Cardiology

## 2019-12-27 VITALS — BP 102/64 | HR 76 | Temp 97.3°F | Ht 71.0 in | Wt 188.4 lb

## 2019-12-27 DIAGNOSIS — E782 Mixed hyperlipidemia: Secondary | ICD-10-CM

## 2019-12-27 DIAGNOSIS — I10 Essential (primary) hypertension: Secondary | ICD-10-CM

## 2019-12-27 DIAGNOSIS — I300 Acute nonspecific idiopathic pericarditis: Secondary | ICD-10-CM

## 2019-12-27 DIAGNOSIS — I25118 Atherosclerotic heart disease of native coronary artery with other forms of angina pectoris: Secondary | ICD-10-CM

## 2019-12-27 MED ORDER — TELMISARTAN 20 MG PO TABS: 10.0000 mg | ORAL_TABLET | Freq: Every day | ORAL | 3 refills | Status: DC

## 2019-12-27 NOTE — Patient Instructions (Signed)
Medication Instructions:  Your physician has recommended you make the following change in your medication:  DECREASE: Telmisartan 10 mg take 0.5 tablet by mouth daily.  *If you need a refill on your cardiac medications before your next appointment, please call your pharmacy*   Lab Work: Your physician recommends that you return for lab work in: TODAY CRP, SedRate, CMP, Lipids If you have labs (blood work) drawn today and your tests are completely normal, you will receive your results only by: Marland Kitchen MyChart Message (if you have MyChart) OR . A paper copy in the mail If you have any lab test that is abnormal or we need to change your treatment, we will call you to review the results.   Testing/Procedures: None   Follow-Up: At [facility], you and your health needs are our priority.  As part of our continuing mission to provide you with exceptional heart care, we have created designated Provider Care Teams.  These Care Teams include your primary Cardiologist (physician) and Advanced Practice Providers (APPs -  Physician Assistants and Nurse Practitioners) who all work together to provide you with the care you need, when you need it.  We recommend signing up for the patient portal called "MyChart".  Sign up information is provided on this After Visit Summary.  MyChart is used to connect with patients for Virtual Visits (Telemedicine).  Patients are able to view lab/test results, encounter notes, upcoming appointments, etc.  Non-urgent messages can be sent to your provider as well.   To learn more about what you can do with MyChart, go to [email or web address].    Your next appointment:   3 month(s)  The format for your next appointment:   In Person  Provider:   [name], MD   Other Instructions

## 2019-12-28 ENCOUNTER — Telehealth: Payer: Self-pay

## 2019-12-28 LAB — LIPID PANEL
Chol/HDL Ratio: 2.8 ratio (ref 0.0–5.0)
Cholesterol, Total: 120 mg/dL (ref 100–199)
HDL: 43 mg/dL (ref >39)
LDL Chol Calc (NIH): 46 mg/dL (ref 0–99)
Triglycerides: 193 mg/dL — ABNORMAL HIGH (ref 0–149)
VLDL Cholesterol Cal: 31 mg/dL (ref 5–40)

## 2019-12-28 LAB — COMPREHENSIVE METABOLIC PANEL
ALT: 22 IU/L (ref 0–44)
AST: 25 IU/L (ref 0–40)
Albumin/Globulin Ratio: 2.3 — ABNORMAL HIGH (ref 1.2–2.2)
Albumin: 4.5 g/dL (ref 3.8–4.8)
Alkaline Phosphatase: 50 IU/L (ref 39–117)
BUN/Creatinine Ratio: 21 (ref 10–24)
BUN: 21 mg/dL (ref 8–27)
Bilirubin Total: 0.6 mg/dL (ref 0.0–1.2)
CO2: 20 mmol/L (ref 20–29)
Calcium: 9.7 mg/dL (ref 8.6–10.2)
Chloride: 104 mmol/L (ref 96–106)
Creatinine, Ser: 1.01 mg/dL (ref 0.76–1.27)
GFR calc Af Amer: 89 mL/min/{1.73_m2} (ref >59)
GFR calc non Af Amer: 77 mL/min/{1.73_m2} (ref >59)
Globulin, Total: 2 g/dL (ref 1.5–4.5)
Glucose: 75 mg/dL (ref 65–99)
Potassium: 4.2 mmol/L (ref 3.5–5.2)
Sodium: 139 mmol/L (ref 134–144)
Total Protein: 6.5 g/dL (ref 6.0–8.5)

## 2019-12-28 LAB — SEDIMENTATION RATE: Sed Rate: 15 mm/hr (ref 0–30)

## 2019-12-28 LAB — C-REACTIVE PROTEIN: CRP: <1 mg/L (ref 0–10)

## 2019-12-28 NOTE — Telephone Encounter (Signed)
Spoke with patient regarding results and recommendation.  Patient verbalizes understanding and is agreeable to plan of care. Advised patient to call back with any issues or concerns.  

## 2019-12-28 NOTE — Telephone Encounter (Signed)
-----   Message from Baldo Daub, MD sent at 12/28/2019  8:35 AM EDT ----- Normal or stable result  Good results, both the sedimentation rate and CRP remain quite low off steroids no changes

## 2019-12-30 ENCOUNTER — Other Ambulatory Visit: Payer: Self-pay | Admitting: Cardiology

## 2020-01-04 ENCOUNTER — Telehealth: Payer: Self-pay | Admitting: Cardiology

## 2020-01-04 NOTE — Telephone Encounter (Signed)
Based on my review of his chart and his cardiac history -antibiotic prophylaxis for him to work is not indicated at this time.

## 2020-01-04 NOTE — Telephone Encounter (Signed)
   Pt c/o medication issue:  1. Name of Medication: clindamycin   2. How are you currently taking this medication (dosage and times per day)?   3. Are you having a reaction (difficulty breathing--STAT)?   4. What is your medication issue? Pt said 2 years ago Dr. Dulce Sellar prescribed this antibiotic to take prior dental work. He said he is getting another dental work today and wondering if Dr. Dulce Sellar wants him to take this drug again.   Please advise

## 2020-01-04 NOTE — Telephone Encounter (Signed)
What is your recommendation? 

## 2020-01-05 NOTE — Telephone Encounter (Signed)
Spoke with pt and informed him as per Dr.Tobb's recommendation he does not need antibiotics prior to his procedure. Pt verbalized understanding and had no additional questions.

## 2020-01-27 ENCOUNTER — Telehealth: Payer: Self-pay

## 2020-01-27 NOTE — Telephone Encounter (Signed)
   Primary Cardiologist: [name], MD  Chart reviewed as part of pre-operative protocol coverage.   Simple dental extractions are considered low risk procedures per guidelines and generally do not require any specific cardiac clearance. It is also generally accepted that for simple extractions and dental cleanings, there is no need to interrupt blood thinner therapy.  Molar extraction may involve more bleeding than other teeth, therefore if needed, he can hold plavix for 5 days prior to the procedure and restart as soon as possible after the surgery.   SBE prophylaxis is not required for the patient.  I will route this recommendation to the requesting party via Epic fax function and remove from pre-op pool.  Please call with questions.  [place], [place] 01/27/2020, 11:14 PM

## 2020-01-27 NOTE — Telephone Encounter (Signed)
   [facility] Pre-operative Risk Assessment    HEARTCARE STAFF: - Please ensure there is not already an duplicate clearance open for this procedure. - Under Visit Info/Reason for Call, type in Other and utilize the format Clearance MM/DD/YY or Clearance TBD. Do not use dashes or single digits. - If request is for dental extraction, please clarify the # of teeth to be extracted.  Request for surgical clearance:  1. What type of surgery is being performed? Removal of lower right molar #30 with site preservation ( bone graft ).   2. When is this surgery scheduled? TBD   3. What type of clearance is required (medical clearance vs. Pharmacy clearance to hold med vs. Both)? Medical/Pharmacy  4. Are there any medications that need to be held prior to surgery and how long? Plavix-Unspecified length of time.    5. Practice name and name of physician performing surgery? [facility].   6. What is the office phone number? [phone]   7.   What is the office fax number? [phone]  8.   Anesthesia type (None, local, MAC, general) ? Unspecified   Gita Kudo 01/27/2020, 9:09 AM  _________________________________________________________________   (provider comments below)

## 2020-03-06 ENCOUNTER — Telehealth: Payer: Self-pay | Admitting: Cardiology

## 2020-03-06 NOTE — Telephone Encounter (Signed)
Working with me tomorrow [place]

## 2020-03-06 NOTE — Telephone Encounter (Signed)
    Pt c/o of Chest Pain: STAT if CP now or developed within 24 hours  1. Are you having CP right now? No  2. Are you experiencing any other symptoms (ex. SOB, nausea, vomiting, sweating)? No  3. How long have you been experiencing CP? Saturday  4. Is your CP continuous or coming and going? coming and going  5. Have you taken Nitroglycerin? No ?  Pt said over the weekend he felt chest pain, Saturday he went to hospital at [facility] ED and no sign his having heart attack but they did a ct scan and they saw his left diaphragm is becoming more "sour". He said he is still feeling the CP and he is concern since he has pericarditis and just wanted get Dr. Hulen Shouts recommendations

## 2020-03-06 NOTE — Telephone Encounter (Signed)
Left message on patients voicemail to please return our call.   

## 2020-03-06 NOTE — Telephone Encounter (Signed)
Follow Up:     Pt is returning your call. 

## 2020-03-06 NOTE — Telephone Encounter (Signed)
Spoke with the patient just now and let him know that Dr. Dulce Sellar would see him tomorrow in [place] if he was available. Per Dr. Dulce Sellar approval he was forced into our schedule at 8:20 AM tomorrow morning.

## 2020-03-06 NOTE — Progress Notes (Signed)
Cardiology Office Note:    Date:  03/07/2020   ID:  Eugene Taylor, DOB 09-Apr-1951, MRN [id number]  PCP:  Algis Greenhouse, MD  Cardiologist:  Shirlee More, MD    Referring MD: Algis Greenhouse, MD    ASSESSMENT:    1. Chest pain of uncertain etiology   2. Coronary artery disease of native artery of native heart with stable angina pectoris ([place])   3. Mixed hyperlipidemia   4. Essential hypertension   5. Type 2 diabetes mellitus without complication, without long-term current use of insulin (HCC)    PLAN:    In order of problems listed above:  1. His presentation is most consistent with recurrent pericarditis recheck his inflammatory markers continue colchicine nonsteroidal anti-inflammatory drug and resume prednisone 20 mg daily.  After seeing labs will make a decision on weaning in the future given a prescription for prednisone he can take when he has occurrences. 2. Stable coronary heart disease is stable no indication of acute coronary syndrome continue current medical treatment 3. Stable continue lipid-lowering treatment 4. Stable continue current treatment including ARB 5. Stable continue current treatment   Next appointment: 1 month   Medication Adjustments/Labs and Tests Ordered: Current medicines are reviewed at length with the patient today.  Concerns regarding medicines are outlined above.  Orders Placed This Encounter  Procedures  . C-reactive protein  . Sedimentation rate   Meds ordered this encounter  Medications  . clindamycin (CLEOCIN) 300 MG capsule    Sig: Take 2 capsules (600 mg total) by mouth as directed. Take 2 capsules (679m) 45 minutes prior to dental visits    Dispense:  10 capsule    Refill:  3  . predniSONE (DELTASONE) 20 MG tablet    Sig: Take 1 tablet (20 mg total) by mouth daily with breakfast.    Dispense:  30 tablet    Refill:  3    Chief Complaint  Patient presents with  . Follow-up  . Chest Pain    History of Present Illness:     Eugene FLEMMERis a 69y.o. male with a hx of recurrent idiopathic pericarditis, mild aortic stenosis and regurgitation mild enlargement of the ascending aorta, hypertension, CAD with PCI and stent of proximal left circumflex 11/11/2018.  Because of recurrent and relapsing pericarditis after evaluation by rheumatology was initiated on steroid therapy with a 3 months gradual taper.  He also developed type 2 diabetes mellitus  He was  last seen 12/27/2019.  Compliance with diet, lifestyle and medications: Yes  [facility] felt not to have findings of acute coronary syndrome had an abnormal chest CT with atelectasis left chest and was discharged.  He did not have inflammatory markers performed for his recurrent pericarditis 03/04/2020:  Ref Range & Units 2 d ago  Troponin I <0.034 ng/mL <0.034    Chest CTA: 1. No acute PE or thoracic aortic dissection.  2. Interval elevation of left diaphragmatic leaflet with atelectasis  in the left lower lobe.  3. Coronary and Aortic Atherosclerosis (ICD10-I70.0).   EKG: NORMAL SINUS RHYTHM  POSSIBLE LEFT ATRIAL ENLARGEMENT  MINIMAL VOLTAGE CRITERIA FOR LVH, MAY BE NORMAL VARIANT ( R in aVL )  NONSPECIFIC T WAVE ABNORMALITY  ABNORMAL ECG  NO PREVIOUS ECGS AVAILABLE  Confirmed by KDimas [phone] on 03/04/2020 9:21:04 PM  [place].  Although he said he discussed his history of pericarditis with the ED staff it was never really addressed and he continues to have pleuritic chest pain.  He has had chills but no fevers the symptoms are not severe on his own he increased his nonsteroidal anti-inflammatory drug.  When seen today he has no rub.  I reviewed his testing from the emergency room with him.  We will go ahead and check his inflammatory markers and put him on a low-dose of prednisone he is on diabetic  treatment.  He is having no angina edema shortness of breath palpitation or syncope. Past Medical History:  Diagnosis Date  . Abnormal cardiac CT angiography   . Acute idiopathic pericarditis 04/25/2016   Overview:  2017: hosp, EKG changes, colchicine/indomethacin  . Acute medial meniscal tear 12/15/2014  . Angina pectoris ([place]) 05/05/2018  . Aortic regurgitation 07/07/2018  . Aortic stenosis, mild 07/07/2018  . Arthritis    LEFT SHOULDER  . Asthma   . Benign hypertension 11/27/2015  . Bicuspid aortic valve 05/22/2018  . CAD in native artery 11/11/2018  . Complication of anesthesia    " during a knee surgery , my bllod pressure was up & stayed up "  . Coronary artery disease   . Coronary artery disease of native artery of native heart with stable angina pectoris ([place]) 11/09/2018  . Enlarged thoracic aorta ([place]) 05/22/2018  . External hemorrhoid, thrombosed 12/28/2015   Overview:  2017:   . History of adenomatous polyp of colon   . History of kidney stones   . Hyperlipidemia   . Hypertension   . Lower respiratory infection 02/08/2016  . Mixed hyperlipidemia 11/27/2015  . Obstructive sleep apnea 11/27/2015  . OSA on CPAP    uses cpap  . 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 Right knee meniscal tear   . Screening for diabetes mellitus (DM) 11/27/2015  . Wears glasses     Past Surgical History:  Procedure Laterality Date  . APPENDECTOMY  age 55  . CARDIAC CATHETERIZATION  11/11/2018  . CHONDROPLASTY Right 12/15/2014   Procedure: CHONDROPLASTY;  Surgeon: Gaynelle Arabian, MD;  Location: [facility];  Service: Orthopedics;  Laterality: Right;  . COLONOSCOPY W/ POLYPECTOMY  2011  . CORONARY STENT INTERVENTION  11/11/2018  . CORONARY STENT INTERVENTION N/A 11/11/2018   Procedure: CORONARY STENT INTERVENTION;  Surgeon: Troy Sine, MD;  Location: [place] [facility];  Service: Cardiovascular;  Laterality: N/A;  . CYSTO/  URETEROSCOPIC STONE EXTRACTIONS  1995  . KNEE  ARTHROSCOPY WITH LATERAL MENISECTOMY Right 12/15/2014   Procedure: KNEE ARTHROSCOPY WITH LATERAL MENISECTOMY;  Surgeon: Gaynelle Arabian, MD;  [facility];  Service: Orthopedics;  Laterality: Right;  . KNEE ARTHROSCOPY WITH MEDIAL MENISECTOMY Right 12/15/2014   Procedure: KNEE ARTHROSCOPY WITH MEDIAL MENISECTOMY;  Surgeon: Gaynelle Arabian, MD;  Location: [facility];  Service: Orthopedics;  Laterality: Right;  . LEFT HEART CATH AND CORONARY ANGIOGRAPHY N/A 11/11/2018   Procedure: LEFT HEART CATH AND CORONARY ANGIOGRAPHY;  Surgeon: Troy Sine, MD;  Location: [place] [facility];  Service: Cardiovascular;  Laterality: N/A;  . NASAL SEPTUM SURGERY  1995    Current Medications: Current Meds  Medication Sig  . b complex vitamins tablet Take 1 tablet by mouth daily.  . cholecalciferol (VITAMIN D3) 25 MCG (1000 UT) tablet Take 1,000 Units by mouth daily.  . clindamycin (CLEOCIN) 300 MG capsule Take 2 capsules (600 mg total) by mouth as directed.  Take 2 capsules (64m) 45 minutes prior to dental visits  . clopidogrel (PLAVIX) 75 MG tablet Take 1 tablet (75 mg total) by mouth daily.  . Colchicine (MITIGARE) 0.6 MG CAPS Take 0.6 mg by mouth daily.  .Marland KitchenEPIPEN 2-PAK 0.3 MG/0.3ML SOAJ injection Inject 0.3 mg into the muscle as needed for anaphylaxis (red meat).   .Marland Kitchenglucose monitoring kit (FREESTYLE) monitoring kit 1 each by Does not apply route as needed for other.  . ibuprofen (ADVIL) 800 MG tablet Take 0.5 tablets (400 mg total) by mouth 2 (two) times daily.  .Marland KitchenJARDIANCE 25 MG TABS tablet Take 25 mg by mouth daily.  . metFORMIN (GLUCOPHAGE) 1000 MG tablet Take 1 tablet by mouth daily.  . rosuvastatin (CRESTOR) 20 MG tablet Take 20 mg by mouth daily.  .Marland Kitchentelmisartan (MICARDIS) 20 MG tablet Take 0.5 tablets (10 mg total) by mouth daily.  . [DISCONTINUED] clindamycin (CLEOCIN) 300 MG capsule Take 2 capsules (600 mg total) by mouth as directed. Take 2 capsules (6043m 45  minutes prior to dental visits     Allergies:   Penicillins and Hydrocodeine [dihydrocodeine]   Social History   Socioeconomic History  . Marital status: Married    Spouse name: Not on file  . Number of children: Not on file  . Years of education: Not on file  . Highest education level: Not on file  Occupational History  . Not on file  Tobacco Use  . Smoking status: Never Smoker  . Smokeless tobacco: Never Used  Vaping Use  . Vaping Use: Never used  Substance and Sexual Activity  . Alcohol use: No  . Drug use: No  . Sexual activity: Not on file  Other Topics Concern  . Not on file  Social History Narrative  . Not on file   Social Determinants of Health   Financial Resource Strain:   . Difficulty of Paying Living Expenses:   Food Insecurity:   . Worried About RuCharity fundraisern the Last Year:   . RaArboriculturistn the Last Year:   Transportation Needs:   . LaFilm/video editorMedical):   . Marland Kitchenack of Transportation (Non-Medical):   Physical Activity:   . Days of Exercise per Week:   . Minutes of Exercise per Session:   Stress:   . Feeling of Stress :   Social Connections:   . Frequency of Communication with Friends and Family:   . Frequency of Social Gatherings with Friends and Family:   . Attends Religious Services:   . Active Member of Clubs or Organizations:   . Attends ClArchivisteetings:   . Marland Kitchenarital Status:      Family History: The patient's family history includes Pancreatic cancer in his father; Stomach cancer in his mother. ROS:   Please see the history of present illness.    All other systems reviewed and are negative.  EKGs/Labs/Other Studies Reviewed:    The following studies were reviewed today:    Recent Labs: 10/01/2019: Hemoglobin 17.4; Platelets 221 12/27/2019: ALT 22; BUN 21; Creatinine, Ser 1.01; Potassium 4.2; Sodium 139  Recent Lipid Panel    Component Value Date/Time   CHOL 120 12/27/2019 0956   TRIG 193 (H)  12/27/2019 0956   HDL 43 12/27/2019 0956   CHOLHDL 2.8 12/27/2019 0956   LDLCALC 46 12/27/2019 0956    Physical Exam:    VS:  BP 110/80 (BP Location: Left Arm, Patient Position: Sitting, Cuff Size: Normal)  Pulse 94   Ht 5' 11"  (1.803 m)   Wt 187 lb 9.6 oz (85.1 kg)   SpO2 94%   BMI 26.16 kg/m     Wt Readings from Last 3 Encounters:  03/07/20 187 lb 9.6 oz (85.1 kg)  12/27/19 188 lb 6.4 oz (85.5 kg)  11/15/19 193 lb (87.5 kg)     GEN: Anxious well nourished, well developed in no acute distress HEENT: Normal NECK: No JVD; No carotid bruits LYMPHATICS: No lymphadenopathy CARDIAC: No pericardial or pleuritic rub RRR, no murmurs, rubs, gallops RESPIRATORY:  Clear to auscultation without rales, wheezing or rhonchi  ABDOMEN: Soft, non-tender, non-distended MUSCULOSKELETAL:  No edema; No deformity  SKIN: Warm and dry NEUROLOGIC:  Alert and oriented x 3 PSYCHIATRIC:  Normal affect    Signed, Shirlee More, MD  03/07/2020 8:27 AM    [place]

## 2020-03-07 ENCOUNTER — Ambulatory Visit: Payer: Medicare PPO | Admitting: Cardiology

## 2020-03-07 ENCOUNTER — Encounter: Payer: Self-pay | Admitting: Cardiology

## 2020-03-07 ENCOUNTER — Other Ambulatory Visit: Payer: Self-pay

## 2020-03-07 VITALS — BP 110/80 | HR 94 | Ht 71.0 in | Wt 187.6 lb

## 2020-03-07 DIAGNOSIS — I10 Essential (primary) hypertension: Secondary | ICD-10-CM

## 2020-03-07 DIAGNOSIS — R079 Chest pain, unspecified: Secondary | ICD-10-CM | POA: Diagnosis not present

## 2020-03-07 DIAGNOSIS — E782 Mixed hyperlipidemia: Secondary | ICD-10-CM

## 2020-03-07 DIAGNOSIS — I25118 Atherosclerotic heart disease of native coronary artery with other forms of angina pectoris: Secondary | ICD-10-CM | POA: Diagnosis not present

## 2020-03-07 DIAGNOSIS — E119 Type 2 diabetes mellitus without complications: Secondary | ICD-10-CM

## 2020-03-07 MED ORDER — PREDNISONE 20 MG PO TABS: 20.0000 mg | ORAL_TABLET | Freq: Every day | ORAL | 3 refills | Status: DC

## 2020-03-07 MED ORDER — CLINDAMYCIN HCL 300 MG PO CAPS: 600.0000 mg | ORAL_CAPSULE | ORAL | 3 refills | Status: DC

## 2020-03-07 NOTE — Patient Instructions (Signed)
Medication Instructions:  Your physician has recommended you make the following change in your medication:  START: Prednisone 20 mg take one tablet by mouth daily.   *If you need a refill on your cardiac medications before your next appointment, please call your pharmacy*   Lab Work: Your physician recommends that you return for lab work in: TODAY CRP, Sed Rate If you have labs (blood work) drawn today and your tests are completely normal, you will receive your results only by: Marland Kitchen MyChart Message (if you have MyChart) OR . A paper copy in the mail If you have any lab test that is abnormal or we need to change your treatment, we will call you to review the results.   Testing/Procedures: None   Follow-Up: At [facility], you and your health needs are our priority.  As part of our continuing mission to provide you with exceptional heart care, we have created designated Provider Care Teams.  These Care Teams include your primary Cardiologist (physician) and Advanced Practice Providers (APPs -  Physician Assistants and Nurse Practitioners) who all work together to provide you with the care you need, when you need it.  We recommend signing up for the patient portal called "MyChart".  Sign up information is provided on this After Visit Summary.  MyChart is used to connect with patients for Virtual Visits (Telemedicine).  Patients are able to view lab/test results, encounter notes, upcoming appointments, etc.  Non-urgent messages can be sent to your provider as well.   To learn more about what you can do with MyChart, go to [email or web address].    Your next appointment:   6 week(s)  The format for your next appointment:   In Person  Provider:   [name], MD   Other Instructions

## 2020-03-08 ENCOUNTER — Telehealth: Payer: Self-pay

## 2020-03-08 DIAGNOSIS — Z79899 Other long term (current) drug therapy: Secondary | ICD-10-CM

## 2020-03-08 LAB — SEDIMENTATION RATE: Sed Rate: 29 mm/hr (ref 0–30)

## 2020-03-08 LAB — C-REACTIVE PROTEIN: CRP: 105 mg/L — ABNORMAL HIGH (ref 0–10)

## 2020-03-08 NOTE — Telephone Encounter (Signed)
-----   Message from Baldo Daub, MD sent at 03/08/2020  7:58 AM EDT ----- Normal or stable result  Continue prednisone I like him to increase his colchicine to twice a day 1 week to recheck his CRP and sedimentation rate so we can begin to wean prednisone

## 2020-03-08 NOTE — Telephone Encounter (Signed)
Spoke with patient regarding results and recommendation.  Patient verbalizes understanding and is agreeable to plan of care. Advised patient to call back with any issues or concerns.  

## 2020-03-15 ENCOUNTER — Other Ambulatory Visit: Payer: Self-pay

## 2020-03-15 DIAGNOSIS — Z79899 Other long term (current) drug therapy: Secondary | ICD-10-CM

## 2020-03-16 ENCOUNTER — Telehealth: Payer: Self-pay

## 2020-03-16 DIAGNOSIS — I10 Essential (primary) hypertension: Secondary | ICD-10-CM

## 2020-03-16 DIAGNOSIS — I300 Acute nonspecific idiopathic pericarditis: Secondary | ICD-10-CM

## 2020-03-16 LAB — HIGH SENSITIVITY CRP: CRP, High Sensitivity: 2.45 mg/L (ref 0.00–3.00)

## 2020-03-16 LAB — SEDIMENTATION RATE: Sed Rate: 55 mm/hr — ABNORMAL HIGH (ref 0–30)

## 2020-03-16 NOTE — Telephone Encounter (Signed)
Spoke with patient regarding results and recommendation.  Patient verbalizes understanding and is agreeable to plan of care. Advised patient to call back with any issues or concerns.  

## 2020-03-16 NOTE — Telephone Encounter (Signed)
-----   Message from Baldo Daub, MD sent at 03/16/2020  1:10 PM EDT ----- I pay more attention to CRP and the other marker I would continue him on his prednisone and let us recheck in 1 week.

## 2020-03-23 ENCOUNTER — Other Ambulatory Visit: Payer: Self-pay | Admitting: Cardiology

## 2020-03-23 ENCOUNTER — Other Ambulatory Visit: Payer: Self-pay

## 2020-03-23 DIAGNOSIS — I300 Acute nonspecific idiopathic pericarditis: Secondary | ICD-10-CM

## 2020-03-24 LAB — SEDIMENTATION RATE: Sed Rate: 17 mm/hr (ref 0–30)

## 2020-03-24 LAB — C-REACTIVE PROTEIN: CRP: 1 mg/L (ref 0–10)

## 2020-03-28 ENCOUNTER — Telehealth: Payer: Self-pay

## 2020-03-28 DIAGNOSIS — I300 Acute nonspecific idiopathic pericarditis: Secondary | ICD-10-CM

## 2020-03-28 NOTE — Telephone Encounter (Signed)
-----   Message from Eugene Daub, MD sent at 03/28/2020  9:55 AM EDT ----- Normal or stable result  Markedly improved let us reduce his prednisone to 10 mg a day and recheck CRP and sed rate 2 weeks

## 2020-03-28 NOTE — Telephone Encounter (Signed)
Spoke with patient regarding results and recommendation.  Patient verbalizes understanding and is agreeable to plan of care. Advised patient to call back with any issues or concerns.  

## 2020-03-30 ENCOUNTER — Other Ambulatory Visit: Payer: Self-pay

## 2020-03-30 DIAGNOSIS — E785 Hyperlipidemia, unspecified: Secondary | ICD-10-CM | POA: Insufficient documentation

## 2020-03-30 DIAGNOSIS — G4733 Obstructive sleep apnea (adult) (pediatric): Secondary | ICD-10-CM | POA: Insufficient documentation

## 2020-03-30 DIAGNOSIS — Z973 Presence of spectacles and contact lenses: Secondary | ICD-10-CM | POA: Insufficient documentation

## 2020-03-30 DIAGNOSIS — M1990 Unspecified osteoarthritis, unspecified site: Secondary | ICD-10-CM | POA: Insufficient documentation

## 2020-03-30 DIAGNOSIS — I2510 Atherosclerotic heart disease of native coronary artery without angina pectoris: Secondary | ICD-10-CM | POA: Insufficient documentation

## 2020-03-30 DIAGNOSIS — T8859XA Other complications of anesthesia, initial encounter: Secondary | ICD-10-CM | POA: Insufficient documentation

## 2020-04-03 ENCOUNTER — Other Ambulatory Visit: Payer: Self-pay

## 2020-04-03 MED ORDER — COLCHICINE 0.6 MG PO CAPS: 0.6000 mg | ORAL_CAPSULE | Freq: Every day | ORAL | 3 refills | Status: DC

## 2020-04-03 NOTE — Telephone Encounter (Signed)
Pt calling requesting a refill on colchicine. This is Dr. Hulen Shouts pt. Please address

## 2020-04-09 NOTE — Progress Notes (Signed)
Cardiology Office Note:    Date:  04/11/2020   ID:  Eugene Taylor, DOB 17-Apr-1951, MRN [id number]  PCP:  Algis Greenhouse, MD  Cardiologist:  Shirlee More, MD    Referring MD: Algis Greenhouse, MD    ASSESSMENT:    1. Recurrent idiopathic pericarditis   2. Essential hypertension   3. Mixed hyperlipidemia   4. Coronary artery disease of native artery of native heart with stable angina pectoris ([place])    PLAN:    In order of problems listed above:  1. Stable his inflammatory markers in acute flare quickly normalized to reduce colchicine once a day continue low-dose background nonsteroidal anti-inflammatory drug reduce his prednisone and recheck inflammatory markers in 3 weeks before going to 2.5 mg/day.  I will plan to keep him on a low-dose of background steroid therapy.  I encouraged him to get his third dose of vaccine when available booster 2. Stable continue current treatment will check renal function with his inflammatory markers 3. Continue statin 4. Stable CAD 1 year greater than his PCI continue therapy including clopidogrel and high intensity statin   Next appointment: 3 months   Medication Adjustments/Labs and Tests Ordered: Current medicines are reviewed at length with the patient today.  Concerns regarding medicines are outlined above.  No orders of the defined types were placed in this encounter.  No orders of the defined types were placed in this encounter.   Chief Complaint  Patient presents with  . Follow-up    With pericarditis on steroid    History of Present Illness:    Eugene Taylor is a 69 y.o. male with a hx of recurrent idiopathic pericarditis, mild aortic stenosis and regurgitation mild enlargement of the ascending aorta, hypertension, CAD with PCI and stent of proximal left circumflex 11/11/2018.  Because of recurrent and relapsing pericarditis after evaluation by rheumatology was initiated on steroid therapy with a 3 months gradual taper.  He  also developed type 2 diabetes mellitus   He was last seen 03/07/2020 with recurrence of pericarditis and was placed back on steroids.. Compliance with diet, lifestyle and medications: Yes  His inflammatory markers quickly normalized and reviewed this with him.  He has had no recurrent pericarditis tolerates his steroids without difficulty presently is taking 10 mg a day.  What I am going to do is drop him to 5 mg daily for 4 weeks then 2-1/2 mg/day and keep him on long-term maintenance small dose of steroid.  I think these recurrent episodes of pericarditis put him at risk for chronic pericardial disease.  No fever chills angina shortness of breath palpitation or syncope.    Ref Range & Units 2 wk ago 1 mo ago 3 mo ago  CRP 0 - 10 mg/L 1  105High  <1     Ref Range & Units 2 wk ago 3 wk ago 1 mo ago  Sed Rate 0 - 30 mm/hr 17  55High  29     Past Medical History:  Diagnosis Date  . Abnormal cardiac CT angiography   . Acute idiopathic pericarditis 04/25/2016   Overview:  2017: hosp, EKG changes, colchicine/indomethacin  . Acute medial meniscal tear 12/15/2014  . Angina pectoris ([place]) 05/05/2018  . Aortic regurgitation 07/07/2018  . Aortic stenosis, mild 07/07/2018  . Arthritis    LEFT SHOULDER  . Asthma   . Benign hypertension 11/27/2015  . Bicuspid aortic valve 05/22/2018  . CAD in native artery 11/11/2018  . Complication of anesthesia    "  during a knee surgery , my bllod pressure was up & stayed up "  . Coronary artery disease   . Coronary artery disease of native artery of native heart with stable angina pectoris ([place]) 11/09/2018  . Enlarged thoracic aorta ([place]) 05/22/2018  . External hemorrhoid, thrombosed 12/28/2015   Overview:  2017:   . History of adenomatous polyp of colon   . History of kidney stones   . Hyperlipidemia   . Hypertension   . Lower respiratory infection 02/08/2016  . Mixed hyperlipidemia 11/27/2015  . Obstructive sleep apnea 11/27/2015  . OSA on CPAP    uses cpap   . 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 Right knee meniscal tear   . Screening for diabetes mellitus (DM) 11/27/2015  . Type 2 diabetes mellitus without complication, without long-term current use of insulin ([place]) 11/27/2015   Formatting of this note might be different from the original. 2019: 107/6.4 2020: 5.9 2020: 130/7.7  . Wears glasses     Past Surgical History:  Procedure Laterality Date  . APPENDECTOMY  age 63  . CARDIAC CATHETERIZATION  11/11/2018  . CHONDROPLASTY Right 12/15/2014   Procedure: CHONDROPLASTY;  Surgeon: Gaynelle Arabian, MD;  Location: [facility];  Service: Orthopedics;  Laterality: Right;  . COLONOSCOPY W/ POLYPECTOMY  2011  . CORONARY STENT INTERVENTION  11/11/2018  . CORONARY STENT INTERVENTION N/A 11/11/2018   Procedure: CORONARY STENT INTERVENTION;  Surgeon: Troy Sine, MD;  Location: [facility];  Service: Cardiovascular;  Laterality: N/A;  . CYSTO/  URETEROSCOPIC STONE EXTRACTIONS  1995  . KNEE ARTHROSCOPY WITH LATERAL MENISECTOMY Right 12/15/2014   Procedure: KNEE ARTHROSCOPY WITH LATERAL MENISECTOMY;  Surgeon: Gaynelle Arabian, MD;  Location: [facility];  Service: Orthopedics;  Laterality: Right;  . KNEE ARTHROSCOPY WITH MEDIAL MENISECTOMY Right 12/15/2014   Procedure: KNEE ARTHROSCOPY WITH MEDIAL MENISECTOMY;  Surgeon: Gaynelle Arabian, MD;  Location: [facility];  Service: Orthopedics;  Laterality: Right;  . LEFT HEART CATH AND CORONARY ANGIOGRAPHY N/A 11/11/2018   Procedure: LEFT HEART CATH AND CORONARY ANGIOGRAPHY;  Surgeon: Troy Sine, MD;  Location: [facility];  Service: Cardiovascular;  Laterality: N/A;  . NASAL SEPTUM SURGERY  1995    Current Medications: Current Meds  Medication Sig  . b complex vitamins tablet Take 1 tablet by mouth daily.  . cholecalciferol (VITAMIN D3) 25 MCG (1000 UT) tablet Take 1,000 Units by mouth daily.  . clindamycin (CLEOCIN) 300 MG capsule Take 2  capsules (600 mg total) by mouth as directed. Take 2 capsules (623m) 45 minutes prior to dental visits  . clopidogrel (PLAVIX) 75 MG tablet Take 1 tablet (75 mg total) by mouth daily.  . Colchicine (MITIGARE) 0.6 MG CAPS Take 0.6 mg by mouth in the morning and at bedtime.  .Marland KitchenEPIPEN 2-PAK 0.3 MG/0.3ML SOAJ injection Inject 0.3 mg into the muscle as needed for anaphylaxis (red meat).   .Marland Kitchenglucose monitoring kit (FREESTYLE) monitoring kit 1 each by Does not apply route as needed for other.  . ibuprofen (ADVIL) 800 MG tablet Take 0.5 tablets (400 mg total) by mouth 2 (two) times daily.  .Marland KitchenJARDIANCE 25 MG TABS tablet Take 25 mg by mouth daily.  . metFORMIN (GLUCOPHAGE) 1000 MG tablet Take 1 tablet by mouth daily.  .Marland KitchenoxyCODONE-acetaminophen (PERCOCET/ROXICET) 5-325 MG tablet   . predniSONE (DELTASONE) 20 MG tablet Take 1 tablet (20 mg total) by mouth daily with breakfast. (Patient taking differently: Take 10 mg by  mouth daily with breakfast. )  . rosuvastatin (CRESTOR) 20 MG tablet Take 20 mg by mouth daily.  Marland Kitchen telmisartan (MICARDIS) 20 MG tablet Take 0.5 tablets (10 mg total) by mouth daily.     Allergies:   Penicillins and Hydrocodeine [dihydrocodeine]   Social History   Socioeconomic History  . Marital status: Married    Spouse name: Not on file  . Number of children: Not on file  . Years of education: Not on file  . Highest education level: Not on file  Occupational History  . Not on file  Tobacco Use  . Smoking status: Never Smoker  . Smokeless tobacco: Never Used  Vaping Use  . Vaping Use: Never used  Substance and Sexual Activity  . Alcohol use: No  . Drug use: No  . Sexual activity: Not on file  Other Topics Concern  . Not on file  Social History Narrative  . Not on file   Social Determinants of Health   Financial Resource Strain:   . Difficulty of Paying Living Expenses:   Food Insecurity:   . Worried About Charity fundraiser in the Last Year:   . Arboriculturist in  the Last Year:   Transportation Needs:   . Film/video editor (Medical):   Marland Kitchen Lack of Transportation (Non-Medical):   Physical Activity:   . Days of Exercise per Week:   . Minutes of Exercise per Session:   Stress:   . Feeling of Stress :   Social Connections:   . Frequency of Communication with Friends and Family:   . Frequency of Social Gatherings with Friends and Family:   . Attends Religious Services:   . Active Member of Clubs or Organizations:   . Attends Archivist Meetings:   Marland Kitchen Marital Status:      Family History: The patient's family history includes Pancreatic cancer in his father; Stomach cancer in his mother. ROS:   Please see the history of present illness.    All other systems reviewed and are negative.  EKGs/Labs/Other Studies Reviewed:    The following studies were reviewed today:  Recent Labs: 10/01/2019: Hemoglobin 17.4; Platelets 221 12/27/2019: ALT 22; BUN 21; Creatinine, Ser 1.01; Potassium 4.2; Sodium 139  Recent Lipid Panel    Component Value Date/Time   CHOL 120 12/27/2019 0956   TRIG 193 (H) 12/27/2019 0956   HDL 43 12/27/2019 0956   CHOLHDL 2.8 12/27/2019 0956   LDLCALC 46 12/27/2019 0956    Physical Exam:    VS:  BP 120/70   Pulse 79   Ht 5' 11"  (1.803 m)   Wt 187 lb (84.8 kg)   SpO2 96%   BMI 26.08 kg/m     Wt Readings from Last 3 Encounters:  04/11/20 187 lb (84.8 kg)  03/07/20 187 lb 9.6 oz (85.1 kg)  12/27/19 188 lb 6.4 oz (85.5 kg)     GEN:  Well nourished, well developed in no acute distress HEENT: Normal NECK: No JVD; No carotid bruits LYMPHATICS: No lymphadenopathy CARDIAC: No rub RRR, no murmurs, rubs, gallops RESPIRATORY:  Clear to auscultation without rales, wheezing or rhonchi  ABDOMEN: Soft, non-tender, non-distended MUSCULOSKELETAL:  No edema; No deformity  SKIN: Warm and dry NEUROLOGIC:  Alert and oriented x 3 PSYCHIATRIC:  Normal affect    Signed, Shirlee More, MD  04/11/2020 1:02 PM    [facility]

## 2020-04-10 ENCOUNTER — Telehealth: Payer: Self-pay | Admitting: Cardiology

## 2020-04-10 ENCOUNTER — Telehealth: Payer: Self-pay

## 2020-04-10 MED ORDER — COLCHICINE 0.6 MG PO CAPS: 0.6000 mg | ORAL_CAPSULE | Freq: Two times a day (BID) | ORAL | 3 refills | Status: DC

## 2020-04-10 NOTE — Telephone Encounter (Signed)
-----   Message from Abelardo Diesel sent at 04/10/2020 12:10 PM EDT ----- Regarding: MEDS Pt states the medication that was sent to the pharmacy this AM needs prior auth. I have not seen anything come through the fax for that. Pt asked if you could call the pharmacy and let them know that the medication is to be taken 2 times daily.   Thank you,  Hermenia Bers

## 2020-04-10 NOTE — Addendum Note (Signed)
Addended by: Delorse Limber I on: 04/10/2020 11:36 AM   Modules accepted: Orders

## 2020-04-10 NOTE — Telephone Encounter (Signed)
Please advise. I do not see where he was told to take Colchicine 0.6 mg twice daily. The only thing I see is once daily.

## 2020-04-10 NOTE — Telephone Encounter (Signed)
Reconcile dose frequency and continue same

## 2020-04-10 NOTE — Telephone Encounter (Signed)
Called patient just now and left a detailed message letting him know that I just sent in a new updated prescription for him and that he should be able to get this from the pharmacy at this time. I also gave our call back number for him to call back if he had any issues or concerns.

## 2020-04-10 NOTE — Telephone Encounter (Signed)
Winford is returning [employer]. I reached out to [facility] via secure chat and she advised she would call the patient once a response is received from Dr. Dulce Sellar. Patient verbalized understanding.

## 2020-04-10 NOTE — Telephone Encounter (Signed)
Pt c/o medication issue:  1. Name of Medication: Colchicine (MITIGARE) 0.6 MG CAPS  2. How are you currently taking this medication (dosage and times per day)? 2x daily  3. Are you having a reaction (difficulty breathing--STAT)? no  4. What is your medication issue? Patient states Dr. Dulce Sellar increased this medication to 2x daily. He states that he has ran out of this medication sooner because of this and insurance is refusing to pay. Please advise.

## 2020-04-10 NOTE — Telephone Encounter (Signed)
Spoke with patients wife just now and let her know that I called the pharmacy and they said that this patients prescription was ready to be picked up. She verbalizes understanding and thanks me for the call back.    Encouraged patient to call back with any questions or concerns.

## 2020-04-11 ENCOUNTER — Telehealth: Payer: Self-pay

## 2020-04-11 ENCOUNTER — Ambulatory Visit: Payer: Medicare PPO | Admitting: Cardiology

## 2020-04-11 ENCOUNTER — Other Ambulatory Visit: Payer: Self-pay

## 2020-04-11 ENCOUNTER — Encounter: Payer: Self-pay | Admitting: Cardiology

## 2020-04-11 VITALS — BP 120/70 | HR 79 | Ht 71.0 in | Wt 187.0 lb

## 2020-04-11 DIAGNOSIS — I300 Acute nonspecific idiopathic pericarditis: Secondary | ICD-10-CM | POA: Diagnosis not present

## 2020-04-11 DIAGNOSIS — I25118 Atherosclerotic heart disease of native coronary artery with other forms of angina pectoris: Secondary | ICD-10-CM | POA: Diagnosis not present

## 2020-04-11 DIAGNOSIS — I10 Essential (primary) hypertension: Secondary | ICD-10-CM

## 2020-04-11 DIAGNOSIS — E782 Mixed hyperlipidemia: Secondary | ICD-10-CM

## 2020-04-11 MED ORDER — COLCHICINE 0.6 MG PO CAPS: 0.6000 mg | ORAL_CAPSULE | Freq: Every day | ORAL | 3 refills | Status: DC

## 2020-04-11 MED ORDER — PREDNISONE 5 MG PO TABS
ORAL_TABLET | ORAL | 3 refills | Status: DC
Start: 2020-04-11 — End: 2020-05-10

## 2020-04-11 NOTE — Telephone Encounter (Signed)
   [facility] Pre-operative Risk Assessment    HEARTCARE STAFF: - Please ensure there is not already an duplicate clearance open for this procedure. - Under Visit Info/Reason for Call, type in Other and utilize the format Clearance MM/DD/YY or Clearance TBD. Do not use dashes or single digits. - If request is for dental extraction, please clarify the # of teeth to be extracted.  Request for surgical clearance:  1. What type of surgery is being performed? Left reverse total shoulder   2. When is this surgery scheduled? TBD   3. What type of clearance is required (medical clearance vs. Pharmacy clearance to hold med vs. Both)? Both  4. Are there any medications that need to be held prior to surgery and how long? Unspecified   5. Practice name and name of physician performing surgery? [facility]. Dr. Esmond Plants   6. What is the office phone number? [phone]   7.   What is the office fax number? [phone]  8.   Anesthesia type (None, local, MAC, general) ? General   Gita Kudo 04/11/2020, 4:40 PM  _________________________________________________________________   (provider comments below)  \

## 2020-04-11 NOTE — Patient Instructions (Signed)
Medication Instructions:  Your physician has recommended you make the following change in your medication:  DECREASE: Colchicine 0.6 mg take one tablet by mouth daily.  DECREASE: Prednisone 5 mg take one tablet by mouth daily for 4 weeks. Then take 1/2 tablet by mouth daily.  *If you need a refill on your cardiac medications before your next appointment, please call your pharmacy*   Lab Work: Your physician recommends that you return for lab work in: 3 weeks Sed rate, CRP If you have labs (blood work) drawn today and your tests are completely normal, you will receive your results only by: Marland Kitchen MyChart Message (if you have MyChart) OR . A paper copy in the mail If you have any lab test that is abnormal or we need to change your treatment, we will call you to review the results.   Testing/Procedures: None   Follow-Up: At [facility], you and your health needs are our priority.  As part of our continuing mission to provide you with exceptional heart care, we have created designated Provider Care Teams.  These Care Teams include your primary Cardiologist (physician) and Advanced Practice Providers (APPs -  Physician Assistants and Nurse Practitioners) who all work together to provide you with the care you need, when you need it.  We recommend signing up for the patient portal called "MyChart".  Sign up information is provided on this After Visit Summary.  MyChart is used to connect with patients for Virtual Visits (Telemedicine).  Patients are able to view lab/test results, encounter notes, upcoming appointments, etc.  Non-urgent messages can be sent to your provider as well.   To learn more about what you can do with MyChart, go to [email or web address].    Your next appointment:   3 month(s)  The format for your next appointment:   In Person  Provider:   [name], MD   Other Instructions

## 2020-04-12 NOTE — Telephone Encounter (Signed)
° °  Primary Cardiologist: [name], MD  Chart reviewed as part of pre-operative protocol coverage. Given past medical history and time since last visit, based on ACC/AHA guidelines, Eugene Taylor would be at acceptable risk for the planned procedure without further cardiovascular testing.   I will route this recommendation to the requesting party via Epic fax function and remove from pre-op pool.  Please call with questions.  Thomasene Ripple. Anny Sayler NP-C    04/12/2020, 9:36 AM [facility] [address] Office [phone] Fax [phone]

## 2020-05-08 ENCOUNTER — Other Ambulatory Visit: Payer: Self-pay

## 2020-05-08 ENCOUNTER — Ambulatory Visit (INDEPENDENT_AMBULATORY_CARE_PROVIDER_SITE_OTHER): Payer: Medicare PPO | Admitting: Allergy and Immunology

## 2020-05-08 ENCOUNTER — Encounter: Payer: Self-pay | Admitting: Allergy and Immunology

## 2020-05-08 VITALS — BP 132/86 | HR 96 | Resp 16 | Ht 69.4 in | Wt 189.4 lb

## 2020-05-08 DIAGNOSIS — T7800XA Anaphylactic reaction due to unspecified food, initial encounter: Secondary | ICD-10-CM | POA: Diagnosis not present

## 2020-05-08 DIAGNOSIS — I300 Acute nonspecific idiopathic pericarditis: Secondary | ICD-10-CM

## 2020-05-08 MED ORDER — EPINEPHRINE 0.3 MG/0.3ML IJ SOAJ: INTRAMUSCULAR | 3 refills | Status: DC

## 2020-05-08 NOTE — Patient Instructions (Signed)
  1.  Allergen avoidance measures -mammal consumption  2.  EpiPen, Benadryl, MD/ER evaluation for allergic reaction  3.  Review blood test from rheumatologist  4.  Will require a alpha gal blood test but need to hold off obtaining this test until we can review blood test from rheumatologist  5.  Further evaluation and treatment?  6.  Obtain fall flu vaccine and Covid booster

## 2020-05-08 NOTE — Progress Notes (Signed)
[place] [facility] [place] [facility] [place] - [place] - [place]   Dear Dr. Garlon Hatchet,  Thank you for referring Eugene Taylor to the [facility] on 05/08/2020.   Below is a summation of this patient's evaluation and recommendations.  Thank you for your referral. I will keep you informed about this patient's response to treatment.   If you have any questions please do not hesitate to contact me.   Sincerely,  Jiles Prows, MD Allergy / Immunology [place]   ______________________________________________________________________    NEW PATIENT NOTE  Referring Provider: Algis Greenhouse, MD Primary Provider: Algis Greenhouse, MD Date of office visit: 05/08/2020    Subjective:   Chief Complaint:  Eugene Taylor (DOB: 1951-07-26) is a 69 y.o. male who presents to the clinic on 05/08/2020 with a chief complaint of Alpha-gal allergy and Pericarditis .     HPI: Eugene Taylor presents to this clinic in evaluation of 2 main issues.  First, I apparently saw him in his clinic over 5 years ago at which point time we diagnosed him with alpha gal syndrome presenting as recurrent allergic reactions.  He has been mammal free since that point in time and has not had any recurrent allergic reactions.  He is interested in looking at his status of alpha gal sensitivity.  He does have an injectable epinephrine device.  Second, he apparently has developed recurrent or persistent pericarditis over the course of the past 2 years.  Initially this was episodic and responded to the administration of ibuprofen and colchicine.  However, this has become a persistent issue and it sounds as though he has been on systemic steroids since April 2020.  Every time he tapers down to 5 mg prednisone daily he has a flare of his recurrent pericarditis manifested predominantly as intense sternal pain with a pleuritic quality.  When he takes  high-dose prednisone he does respond.  There does not appear to be any associated systemic or constitutional symptoms or obvious provoking factor giving rise to this issue.  He has apparently seen a rheumatologist in investigation of connective tissue disease giving rise to his recurrent pericarditis and apparently the rheumatologist felt that this is not the case.  There does not appear to be any new medications that have contributed to this issue as most of the medications that he has utilizing at this point in time has been since the placement of a stent in March 2020 or secondary to hyperglycemia that developed as a result of prednisone therapy.  He has been on Crestor for years and this did precede the development of his recurrent pericarditis.  He has obtained 2 Pfizer Covid vaccines.  Past Medical History:  Diagnosis Date  . Abnormal cardiac CT angiography   . Acute idiopathic pericarditis 04/25/2016   Overview:  2017: hosp, EKG changes, colchicine/indomethacin  . Acute medial meniscal tear 12/15/2014  . Angina pectoris ([place]) 05/05/2018  . Aortic regurgitation 07/07/2018  . Aortic stenosis, mild 07/07/2018  . Arthritis    LEFT SHOULDER  . Asthma   . Benign hypertension 11/27/2015  . Bicuspid aortic valve 05/22/2018  . CAD in native artery 11/11/2018  . Complication of anesthesia    " during a knee surgery , my bllod pressure was up & stayed up "  . Coronary artery disease   . Coronary artery disease of native artery of native heart with stable angina pectoris ([place]) 11/09/2018  .  Enlarged thoracic aorta ([place]) 05/22/2018  . External hemorrhoid, thrombosed 12/28/2015   Overview:  2017:   . History of adenomatous polyp of colon   . History of kidney stones   . Hyperlipidemia   . Hypertension   . Lower respiratory infection 02/08/2016  . Mixed hyperlipidemia 11/27/2015  . Obstructive sleep apnea 11/27/2015  . OSA on CPAP    uses cpap  . 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 Right knee  meniscal tear   . Screening for diabetes mellitus (DM) 11/27/2015  . Type 2 diabetes mellitus without complication, without long-term current use of insulin ([place]) 11/27/2015   Formatting of this note might be different from the original. 2019: 107/6.4 2020: 5.9 2020: 130/7.7  . Wears glasses     Past Surgical History:  Procedure Laterality Date  . APPENDECTOMY  age 37  . CARDIAC CATHETERIZATION  11/11/2018  . CHONDROPLASTY Right 12/15/2014   Procedure: CHONDROPLASTY;  Surgeon: Gaynelle Arabian, MD;  Location: [facility];  Service: Orthopedics;  Laterality: Right;  . COLONOSCOPY W/ POLYPECTOMY  2011  . CORONARY STENT INTERVENTION  11/11/2018  . CORONARY STENT INTERVENTION N/A 11/11/2018   Procedure: CORONARY STENT INTERVENTION;  Surgeon: Troy Sine, MD;  Location: [facility];  Service: Cardiovascular;  Laterality: N/A;  . CYSTO/  URETEROSCOPIC STONE EXTRACTIONS  1995  . KNEE ARTHROSCOPY WITH LATERAL MENISECTOMY Right 12/15/2014   Procedure: KNEE ARTHROSCOPY WITH LATERAL MENISECTOMY;  Surgeon: Gaynelle Arabian, MD;  Location: [facility];  Service: Orthopedics;  Laterality: Right;  . KNEE ARTHROSCOPY WITH MEDIAL MENISECTOMY Right 12/15/2014   Procedure: KNEE ARTHROSCOPY WITH MEDIAL MENISECTOMY;  Surgeon: Gaynelle Arabian, MD;  Location: [facility];  Service: Orthopedics;  Laterality: Right;  . LEFT HEART CATH AND CORONARY ANGIOGRAPHY N/A 11/11/2018   Procedure: LEFT HEART CATH AND CORONARY ANGIOGRAPHY;  Surgeon: Troy Sine, MD;  Location: [facility];  Service: Cardiovascular;  Laterality: N/A;  . NASAL SEPTUM SURGERY  1995    Allergies as of 05/08/2020      Reactions   Penicillins Other (See Comments)   Did it involve swelling of the face/tongue/throat, SOB, or low BP? Unknown Did it involve sudden or severe rash/hives, skin peeling, or any reaction on the inside of your mouth or nose? Unknown Did you need to seek medical attention  at a hospital or doctor's office? Unknown When did it last happen?Childhood allergy If all above answers are "NO", may proceed with cephalosporin use.   Hydrocodeine [dihydrocodeine] Itching      Medication List    b complex vitamins tablet Take 1 tablet by mouth daily.   cholecalciferol 25 MCG (1000 UNIT) tablet Commonly known as: VITAMIN D3 Take 1,000 Units by mouth daily.   clindamycin 300 MG capsule Commonly known as: CLEOCIN Take 2 capsules (600 mg total) by mouth as directed. Take 2 capsules (643m) 45 minutes prior to dental visits   clopidogrel 75 MG tablet Commonly known as: PLAVIX Take 1 tablet (75 mg total) by mouth daily.   Colchicine 0.6 MG Caps Commonly known as: Mitigare Take 0.6 mg by mouth daily.   EpiPen 2-Pak 0.3 mg/0.3 mL Soaj injection Generic drug: EPINEPHrine Inject 0.3 mg into the muscle as needed for anaphylaxis (red meat).   glucose monitoring kit monitoring kit 1 each by Does not apply route as needed for other.   ibuprofen 800 MG tablet Commonly known as: ADVIL Take 0.5 tablets (400 mg total) by mouth 2 (two)  times daily.   Jardiance 25 MG Tabs tablet Generic drug: empagliflozin Take 25 mg by mouth daily.   nitroGLYCERIN 0.4 MG SL tablet Commonly known as: Nitrostat Place 1 tablet (0.4 mg total) under the tongue every 5 (five) minutes as needed for chest pain.   predniSONE 5 MG tablet Commonly known as: DELTASONE Take one tablet by mouth ( 5 mg ) daily for 4 weeks. Then decrease to 1/2 tablet ( 2.5 mg ) daily.   rosuvastatin 20 MG tablet Commonly known as: CRESTOR Take 20 mg by mouth daily.   telmisartan 20 MG tablet Commonly known as: MICARDIS Take 0.5 tablets (10 mg total) by mouth daily.       Review of systems negative except as noted in HPI / PMHx or noted below:  Review of Systems  Constitutional: Negative.   HENT: Negative.   Eyes: Negative.   Respiratory: Negative.   Cardiovascular: Negative.     Gastrointestinal: Negative.   Genitourinary: Negative.   Musculoskeletal: Negative.   Skin: Negative.   Neurological: Negative.   Endo/Heme/Allergies: Negative.   Psychiatric/Behavioral: Negative.     Family History  Problem Relation Age of Onset  . Stomach cancer Mother   . Pancreatic cancer Father     Social History   Socioeconomic History  . Marital status: Married    Spouse name: Not on file  . Number of children: Not on file  . Years of education: Not on file  . Highest education level: Not on file  Occupational History  . Not on file  Tobacco Use  . Smoking status: Never Smoker  . Smokeless tobacco: Never Used  Vaping Use  . Vaping Use: Never used  Substance and Sexual Activity  . Alcohol use: No  . Drug use: No  . Sexual activity: Not on file  Other Topics Concern  . Not on file  Social History Narrative  . Not on file    Environmental and Social history  Lives in a house with a dry environment, no animals located at the hospital, carpet in the bedroom, no plastic on the bed, no plastic on the pillow, no smoking ongoing with inside the household.  Objective:   Vitals:   05/08/20 1408  BP: 132/86  Pulse: 96  Resp: 16  SpO2: 94%   Height: 5' 9.4" (176.3 cm) Weight: 189 lb 6.4 oz (85.9 kg)  Physical Exam Constitutional:      Appearance: He is not diaphoretic.  HENT:     Head: Normocephalic.     Right Ear: Tympanic membrane, ear canal and external ear normal.     Left Ear: Tympanic membrane, ear canal and external ear normal.     Nose: Nose normal. No mucosal edema or rhinorrhea.     Mouth/Throat:     Pharynx: Uvula midline. No oropharyngeal exudate.  Eyes:     Conjunctiva/sclera: Conjunctivae normal.  Neck:     Thyroid: No thyromegaly.     Trachea: Trachea normal. No tracheal tenderness or tracheal deviation.  Cardiovascular:     Rate and Rhythm: Normal rate and regular rhythm.     Heart sounds: Normal heart sounds, S1 normal and S2  normal. No murmur heard.   Pulmonary:     Effort: No respiratory distress.     Breath sounds: Normal breath sounds. No stridor. No wheezing or rales.  Lymphadenopathy:     Head:     Right side of head: No tonsillar adenopathy.     Left side of head:  No tonsillar adenopathy.     Cervical: No cervical adenopathy.  Skin:    Findings: No erythema or rash.     Nails: There is no clubbing.  Neurological:     Mental Status: He is alert.     Diagnostics: Allergy skin tests were not performed.   Assessment and Plan:    1. Recurrent idiopathic pericarditis   2. Allergy with anaphylaxis due to food     1.  Allergen avoidance measures -mammal consumption  2.  EpiPen, Benadryl, MD/ER evaluation for allergic reaction  3.  Review blood test from rheumatologist  4.  Will require a alpha gal blood test but need to hold off obtaining this test until we can review blood test from rheumatologist  5.  Further evaluation and treatment?  6.  Obtain fall flu vaccine and Covid booster  Costas has a history of Alpha-gal syndrome and we will check his current status with the blood tests noted above. Why he has recurrent pericarditis is unknown. I will review the evaluation performed by the rheumatologist and consider further evaluation if appropriate. I will contact him concerning this direction of evaluation.   Jiles Prows, MD Allergy / Immunology [facility]

## 2020-05-09 ENCOUNTER — Encounter: Payer: Self-pay | Admitting: Allergy and Immunology

## 2020-05-09 NOTE — Progress Notes (Signed)
Cardiology Office Note:    Date:  05/10/2020   ID:  [name], DOB [birth year], MRN [id number]  PCP:  [place] Bass, MD  Cardiologist:  [name], MD    Referring MD: [place] Bass, MD    ASSESSMENT:    1. Recurrent idiopathic pericarditis    PLAN:    In order of problems listed above:  1. Unfortunately he has broken through steroids keep him at 20 mg a day along with colchicine ibuprofen and pursue immune suppressant treatment.  Recheck inflammatory markers today.  The other option be pericardial stripping which I think is not possible the patient proposed and has a real opportunity to be able to achieve remission, off steroids. 2. His CAD hypertension dyslipidemia are stable continue current treatment   Next appointment: 2 to 3 weeks   Medication Adjustments/Labs and Tests Ordered: Current medicines are reviewed at length with the patient today.  Concerns regarding medicines are outlined above.  Orders Placed This Encounter  Procedures   CBC   C-reactive protein   Sedimentation rate   Meds ordered this encounter  Medications   predniSONE (DELTASONE) 20 MG tablet    Sig: Take 1 tablet (20 mg total) by mouth daily with breakfast.    Dispense:  90 tablet    Refill:  3    No chief complaint on file.   History of Present Illness:    Eugene Taylor is a 69 y.o. male with a hx of  recurrent idiopathic pericarditis, mild aortic stenosis and regurgitation mild enlargement of the ascending aorta, hypertension, CAD with PCI and stent of proximal left circumflex 11/11/2018.  Because of recurrent and relapsing pericarditis after evaluation by rheumatology was initiated on steroid therapy with a 3 months gradual taper.  He also developed type 2 diabetes mellitus   He was seen 03/07/2020 with recurrence of pericarditis and was placed back on steroids. He was last seen 04/11/2020. Compliance with diet, lifestyle and medications: Yes  I was unaware that he is  welcome to my office hours for chest pain apparently called on Friday he had recurrent pleuritic discomfort substernal radiates to the right precordium worse supine alleviated with sitting forward clearly pleuritic he increased his ibuprofen 800 mg 3 times a day went back to 20 mg of prednisone with resolution.  When seen today he is apprehensive but is not having chest pain has no pericardial rub we will continue his current steroid dosage recheck inflammatory markers and strongly consider immunotherapy.Rolonacept I will reach out to [facility] to see if they have experience with the drug would like to see the patient deconditioning.   Ref Range & Units 1 mo ago 2 mo ago 4 mo ago  CRP 0 - 10 mg/L 1  105High  <1     1 mo ago  (03/23/20) 1 mo ago  (03/15/20) 2 mo ago  (03/07/20)   Sed Rate 0 - 30 mm/hr 17  55High  29    Cardiac MRI 12.20.2020: IMPRESSION: 1. Mild delayed pericardial enhancement seen adjacent to the lateral wall of the left ventricle. No definite pericardial edema. This suggests fibrosis without acute inflammation, and is consistent with chronic pericarditis. 2. Tricuspid aortic valve with moderately reduced leaflet excursion. Mild aortic valve stenosis and regurgitation. 3. Normal biventricular chamber size and function. LVEF 71 %, RVEF 59%. 4.  No delayed myocardial enhancement.  Past Medical History:  Diagnosis Date   Abnormal cardiac CT angiography    Acute idiopathic  pericarditis 04/25/2016   Overview:  2017: hosp, EKG changes, colchicine/indomethacin   Acute medial meniscal tear 12/15/2014   Angina pectoris ([place]) 05/05/2018   Aortic regurgitation 07/07/2018   Aortic stenosis, mild 07/07/2018   Arthritis    LEFT SHOULDER   Asthma    Benign hypertension 11/27/2015   Bicuspid aortic valve 05/22/2018   CAD in native artery 12/27/5463   Complication of anesthesia    " during a knee surgery , my bllod pressure was up & stayed up "   Coronary artery  disease    Coronary artery disease of native artery of native heart with stable angina pectoris ([place]) 11/09/2018   Enlarged thoracic aorta (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 2019: 107/6.4 2020: 5.9 2020: 130/7.7   Wears glasses     Past Surgical History:  Procedure Laterality Date   APPENDECTOMY  age 26   CARDIAC CATHETERIZATION  11/11/2018   CHONDROPLASTY Right 12/15/2014   Procedure: CHONDROPLASTY;  Surgeon: Gaynelle Arabian, MD;  [facility];  Service: Orthopedics;  Laterality: Right;   COLONOSCOPY W/ POLYPECTOMY  2011   CORONARY STENT INTERVENTION  11/11/2018   CORONARY STENT INTERVENTION N/A 11/11/2018   Procedure: CORONARY STENT INTERVENTION;  Surgeon: Troy Sine, MD;  Location: [facility];  Service: Cardiovascular;  Laterality: N/A;   CYSTO/  URETEROSCOPIC STONE EXTRACTIONS  1995   KNEE ARTHROSCOPY WITH LATERAL MENISECTOMY Right 12/15/2014   Procedure: KNEE ARTHROSCOPY WITH LATERAL MENISECTOMY;  Surgeon: Gaynelle Arabian, MD;  Location: [facility];  Service: Orthopedics;  Laterality: Right;   KNEE ARTHROSCOPY WITH MEDIAL MENISECTOMY Right 12/15/2014   Procedure: KNEE ARTHROSCOPY WITH MEDIAL MENISECTOMY;  Surgeon: Gaynelle Arabian, MD;  Location: [facility];  Service: Orthopedics;  Laterality: Right;   LEFT HEART CATH AND CORONARY  ANGIOGRAPHY N/A 11/11/2018   Procedure: LEFT HEART CATH AND CORONARY ANGIOGRAPHY;  Surgeon: Troy Sine, MD;  Location: [place] [facility] [facility];  Service: Cardiovascular;  Laterality: N/A;   NASAL SEPTUM SURGERY  1995    Current Medications: Current Meds  Medication Sig   b complex vitamins tablet Take 1 tablet by mouth daily.   cholecalciferol (VITAMIN D3) 25 MCG (1000 UT) tablet Take 1,000 Units by mouth daily.   clindamycin (CLEOCIN) 300 MG capsule Take 2 capsules (600 mg total) by mouth as directed. Take 2 capsules ($RemoveBef'600mg'zqeqpTzJbb$ ) 45 minutes prior to dental visits   clopidogrel (PLAVIX) 75 MG tablet Take 1 tablet (75 mg total) by mouth daily.   Colchicine (MITIGARE) 0.6 MG CAPS Take 0.6 mg by mouth daily.   EPINEPHrine 0.3 mg/0.3 mL IJ SOAJ injection Use as directed for life-threatening allergic reaction.   EPIPEN 2-PAK 0.3 MG/0.3ML SOAJ injection Inject 0.3 mg into the muscle as needed for anaphylaxis (red meat).    glucose monitoring kit (FREESTYLE) monitoring kit 1 each by Does  not apply route as needed for other.   ibuprofen (ADVIL) 800 MG tablet Take 0.5 tablets (400 mg total) by mouth 2 (two) times daily. (Patient taking differently: Take 800 mg by mouth every 8 (eight) hours as needed. )   JARDIANCE 25 MG TABS tablet Take 25 mg by mouth daily.   nitroGLYCERIN (NITROSTAT) 0.4 MG SL tablet Place 1 tablet (0.4 mg total) under the tongue every 5 (five) minutes as needed for chest pain.   rosuvastatin (CRESTOR) 20 MG tablet Take 20 mg by mouth daily.   telmisartan (MICARDIS) 20 MG tablet Take 0.5 tablets (10 mg total) by mouth daily.   [DISCONTINUED] predniSONE (DELTASONE) 5 MG tablet Take one tablet by mouth ( 5 mg ) daily for 4 weeks. Then decrease to 1/2 tablet ( 2.5 mg ) daily. (Patient taking differently: Take 5 mg by mouth. Take 4 tablets daily (=$RemoveBeforeD'20mg'[id number]$ ))     Allergies:   Penicillins and Hydrocodeine [dihydrocodeine]   Social History   Socioeconomic History   Marital  status: Married    Spouse name: Not on file   Number of children: Not on file   Years of education: Not on file   Highest education level: Not on file  Occupational History   Not on file  Tobacco Use   Smoking status: Never Smoker   Smokeless tobacco: Never Used  Vaping Use   Vaping Use: Never used  Substance and Sexual Activity   Alcohol use: No   Drug use: No   Sexual activity: Not on file  Other Topics Concern   Not on file  Social History Narrative   Not on file   Social Determinants of Health   Financial Resource Strain:    Difficulty of Paying Living Expenses: Not on file  Food Insecurity:    Worried About [place] in the Last Year: Not on file   Ran Out of Food in the Last Year: Not on file  Transportation Needs:    Lack of Transportation (Medical): Not on file   Lack of Transportation (Non-Medical): Not on file  Physical Activity:    Days of Exercise per Week: Not on file   Minutes of Exercise per Session: Not on file  Stress:    Feeling of Stress : Not on file  Social Connections:    Frequency of Communication with Friends and Family: Not on file   Frequency of Social Gatherings with Friends and Family: Not on file   Attends Religious Services: Not on file   Active Member of Clubs or Organizations: Not on file   Attends Archivist Meetings: Not on file   Marital Status: Not on file     Family History: The patient's family history includes Pancreatic cancer in his father; Stomach cancer in his mother. ROS:   Please see the history of present illness.    All other systems reviewed and are negative.  EKGs/Labs/Other Studies Reviewed:    The following studies were reviewed today:    Recent Labs: 10/01/2019: Hemoglobin 17.4; Platelets 221 12/27/2019: ALT 22; BUN 21; Creatinine, Ser 1.01; Potassium 4.2; Sodium 139  Recent Lipid Panel    Component Value Date/Time   CHOL 120 12/27/2019 0956   TRIG 193 (H)  12/27/2019 0956   HDL 43 12/27/2019 0956   CHOLHDL 2.8 12/27/2019 0956   LDLCALC 46 12/27/2019 0956    Physical Exam:    VS:  BP 121/84    Pulse 86    Ht $R'5\' 9"'hg$  (  1.753 m)    Wt 185 lb (83.9 kg)    SpO2 95%    BMI 27.32 kg/m     Wt Readings from Last 3 Encounters:  05/10/20 185 lb (83.9 kg)  05/08/20 189 lb 6.4 oz (85.9 kg)  04/11/20 187 lb (84.8 kg)     GEN:  Well nourished, well developed in no acute distress HEENT: Normal NECK: No JVD; No carotid bruits LYMPHATICS: No lymphadenopathy CARDIAC: RRR, no murmurs, rubs, gallops RESPIRATORY:  Clear to auscultation without rales, wheezing or rhonchi  ABDOMEN: Soft, non-tender, non-distended MUSCULOSKELETAL:  No edema; No deformity  SKIN: Warm and dry NEUROLOGIC:  Alert and oriented x 3 PSYCHIATRIC:  Normal affect    Signed, Shirlee More, MD  05/10/2020 11:03 AM    [place]

## 2020-05-10 ENCOUNTER — Ambulatory Visit: Payer: Medicare PPO | Admitting: Cardiology

## 2020-05-10 ENCOUNTER — Encounter: Payer: Self-pay | Admitting: Cardiology

## 2020-05-10 ENCOUNTER — Other Ambulatory Visit: Payer: Self-pay

## 2020-05-10 VITALS — BP 121/84 | HR 86 | Ht 69.0 in | Wt 185.0 lb

## 2020-05-10 DIAGNOSIS — I300 Acute nonspecific idiopathic pericarditis: Secondary | ICD-10-CM

## 2020-05-10 MED ORDER — PREDNISONE 20 MG PO TABS: 20.0000 mg | ORAL_TABLET | Freq: Every day | ORAL | 3 refills | Status: DC

## 2020-05-10 NOTE — Patient Instructions (Signed)
Medication Instructions:  Your physician recommends that you continue on your current medications as directed. Please refer to the Current Medication list given to you today.  *If you need a refill on your cardiac medications before your next appointment, please call your pharmacy*   Lab Work: Your physician recommends that you return for lab work in: TODAY CRP, CBC, SedRate If you have labs (blood work) drawn today and your tests are completely normal, you will receive your results only by: Marland Kitchen MyChart Message (if you have MyChart) OR . A paper copy in the mail If you have any lab test that is abnormal or we need to change your treatment, we will call you to review the results.   Testing/Procedures: None   Follow-Up: At [facility], you and your health needs are our priority.  As part of our continuing mission to provide you with exceptional heart care, we have created designated Provider Care Teams.  These Care Teams include your primary Cardiologist (physician) and Advanced Practice Providers (APPs -  Physician Assistants and Nurse Practitioners) who all work together to provide you with the care you need, when you need it.  We recommend signing up for the patient portal called "MyChart".  Sign up information is provided on this After Visit Summary.  MyChart is used to connect with patients for Virtual Visits (Telemedicine).  Patients are able to view lab/test results, encounter notes, upcoming appointments, etc.  Non-urgent messages can be sent to your provider as well.   To learn more about what you can do with MyChart, go to [email or web address].    Your next appointment:   2 week(s)  The format for your next appointment:   In Person  Provider:   [name], MD   Other Instructions

## 2020-05-11 ENCOUNTER — Telehealth: Payer: Self-pay

## 2020-05-11 DIAGNOSIS — R52 Pain, unspecified: Secondary | ICD-10-CM

## 2020-05-11 DIAGNOSIS — I300 Acute nonspecific idiopathic pericarditis: Secondary | ICD-10-CM

## 2020-05-11 DIAGNOSIS — R079 Chest pain, unspecified: Secondary | ICD-10-CM

## 2020-05-11 DIAGNOSIS — Z79899 Other long term (current) drug therapy: Secondary | ICD-10-CM

## 2020-05-11 DIAGNOSIS — I351 Nonrheumatic aortic (valve) insufficiency: Secondary | ICD-10-CM

## 2020-05-11 LAB — CBC
Hematocrit: 48.3 % (ref 37.5–51.0)
Hemoglobin: 15.7 g/dL (ref 13.0–17.7)
MCH: 27.9 pg (ref 26.6–33.0)
MCHC: 32.5 g/dL (ref 31.5–35.7)
MCV: 86 fL (ref 79–97)
Platelets: 301 10*3/uL (ref 150–450)
RBC: 5.62 x10E6/uL (ref 4.14–5.80)
RDW: 15.3 % (ref 11.6–15.4)
WBC: 8 10*3/uL (ref 3.4–10.8)

## 2020-05-11 LAB — C-REACTIVE PROTEIN: CRP: 54 mg/L — ABNORMAL HIGH (ref 0–10)

## 2020-05-11 LAB — SEDIMENTATION RATE: Sed Rate: 88 mm/hr — ABNORMAL HIGH (ref 0–30)

## 2020-05-11 NOTE — Telephone Encounter (Signed)
-----   Message from Baldo Daub, MD sent at 05/11/2020  7:59 AM EDT ----- As expected his inflammatory markers are severely elevated remain on the prednisone 20 mg daily I would recheck in 2 weeks

## 2020-05-11 NOTE — Telephone Encounter (Signed)
Pt verbalized understanding of his lab results and will have them repeated in 2 weeks after staying on the Prednisone 20 mg a day.

## 2020-05-23 NOTE — Progress Notes (Signed)
Cardiology Office Note:    Date:  05/24/2020   ID:  AZZAM MEHRA, DOB 07-Feb-1951, MRN [id number]  PCP:  Algis Greenhouse, MD  Cardiologist:  Shirlee More, MD    Referring MD: Algis Greenhouse, MD    ASSESSMENT:    1. Recurrent idiopathic pericarditis   2. Essential hypertension   3. Hyperlipidemia, unspecified hyperlipidemia type    PLAN:    In order of problems listed above:  1. Had a recent flare on steroids back in 20 mg a day we'll pursue anti-inflammatory treatment with rilonacept. 2. Stable BP at target continue current treatment 3. Stable continue statin. Last lipid profile 12/27/2019 cholesterol 120 LDL 46 triglycerides 193 HDL 43 A1c 11.9%. Recheck CMP and lipid profile   Next appointment: 6 weeks once approved for therapy we'll have him come to our office received the first dose in a supervised setting   Medication Adjustments/Labs and Tests Ordered: Current medicines are reviewed at length with the patient today.  Concerns regarding medicines are outlined above.  Orders Placed This Encounter  Procedures  . C-reactive protein  . Comprehensive metabolic panel  . Lipid panel  . Sed Rate (ESR)   No orders of the defined types were placed in this encounter.   Chief Complaint  Patient presents with  . Follow-up    For pericarditis to discuss rilonacept    History of Present Illness:    Eugene Taylor is a 69 y.o. male with a hx of recurrent pericarditis with a recent flare on reduced steroid dosage last seen 05/10/2020 as expected his inflammatory markers were severely elevated.  We plan to initiate him on rilonacept as an outpatient for his pericarditis. Compliance with diet, lifestyle and medications: Yes  With recurrence his C-reactive protein and sedimentation rate were extremely elevated. Quickly after going back to 20 mg of prednisone his chest pain resolved. Unfortunately is steroid dependent. I have researched I like him to start rilonacept risk  benefits and options detailed the patient agrees will start paperwork for specialty pharmacy and if approved give the first dose in the office. Usually by 6 to 12-week we can withdraw steroids and typically within 1 year treatment regimen for remission is obtained. In his case with his CAD I keep him on long-term low-dose colchicine. He is not having chest pain shortness of breath palpitation or syncope.   13 d ago  (05/10/20) 2 mo ago  (03/23/20) 2 mo ago  (03/07/20)   CRP 0 - 10 mg/L 54High  1  105High    13 d ago  (05/10/20) 2 mo ago  (03/23/20) 2 mo ago  (03/15/20)   Sed Rate 0 - 30 mm/hr 88High  17  55High    Past Medical History:  Diagnosis Date  . Abnormal cardiac CT angiography   . Acute idiopathic pericarditis 04/25/2016   Overview:  2017: hosp, EKG changes, colchicine/indomethacin  . Acute medial meniscal tear 12/15/2014  . Angina pectoris ([place]) 05/05/2018  . Aortic regurgitation 07/07/2018  . Aortic stenosis, mild 07/07/2018  . Arthritis    LEFT SHOULDER  . Asthma   . Benign hypertension 11/27/2015  . Bicuspid aortic valve 05/22/2018  . CAD in native artery 11/11/2018  . Complication of anesthesia    " during a knee surgery , my bllod pressure was up & stayed up "  . Coronary artery disease   . Coronary artery disease of native artery of native heart with stable angina pectoris ([place]) 11/09/2018  .  Enlarged thoracic aorta ([place]) 05/22/2018  . External hemorrhoid, thrombosed 12/28/2015   Overview:  2017:   . History of adenomatous polyp of colon   . History of kidney stones   . Hyperlipidemia   . Hypertension   . Lower respiratory infection 02/08/2016  . Mixed hyperlipidemia 11/27/2015  . Obstructive sleep apnea 11/27/2015  . OSA on CPAP    uses cpap  . 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 Right knee meniscal tear   . Screening for diabetes mellitus (DM) 11/27/2015  . Type 2 diabetes mellitus without complication, without long-term current use of insulin ([place])  11/27/2015   Formatting of this note might be different from the original. 2019: 107/6.4 2020: 5.9 2020: 130/7.7  . Wears glasses     Past Surgical History:  Procedure Laterality Date  . APPENDECTOMY  age 69  . CARDIAC CATHETERIZATION  11/11/2018  . CHONDROPLASTY Right 12/15/2014   Procedure: CHONDROPLASTY;  Surgeon: Gaynelle Arabian, MD;  Location: [facility];  Service: Orthopedics;  Laterality: Right;  . COLONOSCOPY W/ POLYPECTOMY  2011  . CORONARY STENT INTERVENTION  11/11/2018  . CORONARY STENT INTERVENTION N/A 11/11/2018   Procedure: CORONARY STENT INTERVENTION;  Surgeon: Troy Sine, MD;  Location: [place] [facility];  Service: Cardiovascular;  Laterality: N/A;  . CYSTO/  URETEROSCOPIC STONE EXTRACTIONS  1995  . KNEE ARTHROSCOPY WITH LATERAL MENISECTOMY Right 12/15/2014   Procedure: KNEE ARTHROSCOPY WITH LATERAL MENISECTOMY;  Surgeon: Gaynelle Arabian, MD;  Location: [facility];  Service: Orthopedics;  Laterality: Right;  . KNEE ARTHROSCOPY WITH MEDIAL MENISECTOMY Right 12/15/2014   Procedure: KNEE ARTHROSCOPY WITH MEDIAL MENISECTOMY;  Surgeon: Gaynelle Arabian, MD;  Location: [facility];  Service: Orthopedics;  Laterality: Right;  . LEFT HEART CATH AND CORONARY ANGIOGRAPHY N/A 11/11/2018   Procedure: LEFT HEART CATH AND CORONARY ANGIOGRAPHY;  Surgeon: Troy Sine, MD;  Location: [place] [facility];  Service: Cardiovascular;  Laterality: N/A;  . NASAL SEPTUM SURGERY  1995    Current Medications: Current Meds  Medication Sig  . b complex vitamins tablet Take 1 tablet by mouth daily.  . cholecalciferol (VITAMIN D3) 25 MCG (1000 UT) tablet Take 1,000 Units by mouth daily.  . clindamycin (CLEOCIN) 300 MG capsule Take 2 capsules (600 mg total) by mouth as directed. Take 2 capsules (660m) 45 minutes prior to dental visits  . clopidogrel (PLAVIX) 75 MG tablet Take 1 tablet (75 mg total) by mouth daily.  . Colchicine (MITIGARE) 0.6 MG CAPS Take  0.6 mg by mouth daily.  .Marland KitchenEPINEPHrine 0.3 mg/0.3 mL IJ SOAJ injection Use as directed for life-threatening allergic reaction.  .Marland KitchenEPIPEN 2-PAK 0.3 MG/0.3ML SOAJ injection Inject 0.3 mg into the muscle as needed for anaphylaxis (red meat).   .Marland Kitchenglucose monitoring kit (FREESTYLE) monitoring kit 1 each by Does not apply route as needed for other.  . ibuprofen (ADVIL) 800 MG tablet Take 0.5 tablets (400 mg total) by mouth 2 (two) times daily. (Patient taking differently: Take 800 mg by mouth every 8 (eight) hours as needed. )  . JARDIANCE 25 MG TABS tablet Take 25 mg by mouth daily.  . nitroGLYCERIN (NITROSTAT) 0.4 MG SL tablet Place 1 tablet (0.4 mg total) under the tongue every 5 (five) minutes as needed for chest pain.  . predniSONE (DELTASONE) 20 MG tablet Take 1 tablet (20 mg total) by mouth daily with breakfast.  . rosuvastatin (CRESTOR) 20 MG tablet Take 20 mg by mouth daily.  .Marland Kitchen  telmisartan (MICARDIS) 20 MG tablet Take 0.5 tablets (10 mg total) by mouth daily.     Allergies:   Penicillins and Hydrocodeine [dihydrocodeine]   Social History   Socioeconomic History  . Marital status: Married    Spouse name: Not on file  . Number of children: Not on file  . Years of education: Not on file  . Highest education level: Not on file  Occupational History  . Not on file  Tobacco Use  . Smoking status: Never Smoker  . Smokeless tobacco: Never Used  Vaping Use  . Vaping Use: Never used  Substance and Sexual Activity  . Alcohol use: No  . Drug use: No  . Sexual activity: Not on file  Other Topics Concern  . Not on file  Social History Narrative  . Not on file   Social Determinants of Health   Financial Resource Strain:   . Difficulty of Paying Living Expenses: Not on file  Food Insecurity:   . Worried About Charity fundraiser in the Last Year: Not on file  . Ran Out of Food in the Last Year: Not on file  Transportation Needs:   . Lack of Transportation (Medical): Not on file  .  Lack of Transportation (Non-Medical): Not on file  Physical Activity:   . Days of Exercise per Week: Not on file  . Minutes of Exercise per Session: Not on file  Stress:   . Feeling of Stress : Not on file  Social Connections:   . Frequency of Communication with Friends and Family: Not on file  . Frequency of Social Gatherings with Friends and Family: Not on file  . Attends Religious Services: Not on file  . Active Member of Clubs or Organizations: Not on file  . Attends Archivist Meetings: Not on file  . Marital Status: Not on file     Family History: The patient's family history includes Pancreatic cancer in his father; Stomach cancer in his mother. ROS:   Please see the history of present illness.    All other systems reviewed and are negative.  EKGs/Labs/Other Studies Reviewed:    The following studies were reviewed today:    Recent Labs: 12/27/2019: ALT 22; BUN 21; Creatinine, Ser 1.01; Potassium 4.2; Sodium 139 05/10/2020: Hemoglobin 15.7; Platelets 301  Recent Lipid Panel    Component Value Date/Time   CHOL 120 12/27/2019 0956   TRIG 193 (H) 12/27/2019 0956   HDL 43 12/27/2019 0956   CHOLHDL 2.8 12/27/2019 0956   LDLCALC 46 12/27/2019 0956    Physical Exam:    VS:  BP 124/74   Pulse 80   Ht 5' 9"  (1.753 m)   Wt 185 lb (83.9 kg)   SpO2 97%   BMI 27.32 kg/m     Wt Readings from Last 3 Encounters:  05/24/20 185 lb (83.9 kg)  05/10/20 185 lb (83.9 kg)  05/08/20 189 lb 6.4 oz (85.9 kg)     GEN:  Well nourished, well developed in no acute distress HEENT: Normal NECK: No JVD; No carotid bruits LYMPHATICS: No lymphadenopathy CARDIAC: No pericardial rub RRR, no murmurs, rubs, gallops RESPIRATORY:  Clear to auscultation without rales, wheezing or rhonchi  ABDOMEN: Soft, non-tender, non-distended MUSCULOSKELETAL:  No edema; No deformity  SKIN: Warm and dry NEUROLOGIC:  Alert and oriented x 3 PSYCHIATRIC:  Normal affect    Signed, Shirlee More,  MD  05/24/2020 8:50 AM    [place]

## 2020-05-24 ENCOUNTER — Ambulatory Visit: Payer: Medicare PPO | Admitting: Cardiology

## 2020-05-24 ENCOUNTER — Encounter: Payer: Self-pay | Admitting: Cardiology

## 2020-05-24 ENCOUNTER — Other Ambulatory Visit: Payer: Self-pay

## 2020-05-24 VITALS — BP 124/74 | HR 80 | Ht 69.0 in | Wt 185.0 lb

## 2020-05-24 DIAGNOSIS — E785 Hyperlipidemia, unspecified: Secondary | ICD-10-CM | POA: Diagnosis not present

## 2020-05-24 DIAGNOSIS — I300 Acute nonspecific idiopathic pericarditis: Secondary | ICD-10-CM

## 2020-05-24 DIAGNOSIS — I10 Essential (primary) hypertension: Secondary | ICD-10-CM | POA: Diagnosis not present

## 2020-05-24 NOTE — Patient Instructions (Signed)
Medication Instructions:  Your physician has recommended you make the following change in your medication:  START: Arcalyst 320 mg per two 3 ml syringes for the first week.   Then 160 mg per one 3 ml syringe weekly.  *If you need a refill on your cardiac medications before your next appointment, please call your pharmacy*   Lab Work: Your physician recommends that you return for lab work in: TODAY CRP. Sed Rate, CMP, Lipids If you have labs (blood work) drawn today and your tests are completely normal, you will receive your results only by: Marland Kitchen MyChart Message (if you have MyChart) OR . A paper copy in the mail If you have any lab test that is abnormal or we need to change your treatment, we will call you to review the results.   Testing/Procedures: None   Follow-Up: At [facility], you and your health needs are our priority.  As part of our continuing mission to provide you with exceptional heart care, we have created designated Provider Care Teams.  These Care Teams include your primary Cardiologist (physician) and Advanced Practice Providers (APPs -  Physician Assistants and Nurse Practitioners) who all work together to provide you with the care you need, when you need it.  We recommend signing up for the patient portal called "MyChart".  Sign up information is provided on this After Visit Summary.  MyChart is used to connect with patients for Virtual Visits (Telemedicine).  Patients are able to view lab/test results, encounter notes, upcoming appointments, etc.  Non-urgent messages can be sent to your provider as well.   To learn more about what you can do with MyChart, go to [email or web address].    Your next appointment:   6 week(s)  The format for your next appointment:   In Person  Provider:   [name], MD   Other Instructions

## 2020-05-25 ENCOUNTER — Telehealth: Payer: Self-pay

## 2020-05-25 LAB — COMPREHENSIVE METABOLIC PANEL
ALT: 25 IU/L (ref 0–44)
AST: 20 IU/L (ref 0–40)
Albumin/Globulin Ratio: 1.9 (ref 1.2–2.2)
Albumin: 4.4 g/dL (ref 3.8–4.8)
Alkaline Phosphatase: 64 IU/L (ref 44–121)
BUN/Creatinine Ratio: 22 (ref 10–24)
BUN: 26 mg/dL (ref 8–27)
Bilirubin Total: 0.6 mg/dL (ref 0.0–1.2)
CO2: 21 mmol/L (ref 20–29)
Calcium: 9.8 mg/dL (ref 8.6–10.2)
Chloride: 102 mmol/L (ref 96–106)
Creatinine, Ser: 1.19 mg/dL (ref 0.76–1.27)
GFR calc Af Amer: 73 mL/min/{1.73_m2} (ref >59)
GFR calc non Af Amer: 63 mL/min/{1.73_m2} (ref >59)
Globulin, Total: 2.3 g/dL (ref 1.5–4.5)
Glucose: 117 mg/dL — ABNORMAL HIGH (ref 65–99)
Potassium: 4.1 mmol/L (ref 3.5–5.2)
Sodium: 140 mmol/L (ref 134–144)
Total Protein: 6.7 g/dL (ref 6.0–8.5)

## 2020-05-25 LAB — LIPID PANEL
Chol/HDL Ratio: 2.1 ratio (ref 0.0–5.0)
Cholesterol, Total: 131 mg/dL (ref 100–199)
HDL: 61 mg/dL (ref >39)
LDL Chol Calc (NIH): 40 mg/dL (ref 0–99)
Triglycerides: 192 mg/dL — ABNORMAL HIGH (ref 0–149)
VLDL Cholesterol Cal: 30 mg/dL (ref 5–40)

## 2020-05-25 LAB — C-REACTIVE PROTEIN: CRP: <1 mg/L (ref 0–10)

## 2020-05-25 LAB — SEDIMENTATION RATE: Sed Rate: 38 mm/hr — ABNORMAL HIGH (ref 0–30)

## 2020-05-25 NOTE — Telephone Encounter (Signed)
-----   Message from Baldo Daub, MD sent at 05/25/2020  8:29 AM EDT ----- Good results  Lipids are at target  C-reactive protein is quickly normalized and sedimentation rate which moves at a slower pace is improved  No changes to the plan from yesterday

## 2020-05-25 NOTE — Telephone Encounter (Signed)
-----   Message from Brian J Munley, MD sent at 05/25/2020  8:29 AM EDT ----- Good results  Lipids are at target  C-reactive protein is quickly normalized and sedimentation rate which moves at a slower pace is improved  No changes to the plan from yesterday 

## 2020-05-25 NOTE — Telephone Encounter (Signed)
Spoke with patient regarding results and recommendation.  Patient verbalizes understanding and is agreeable to plan of care. Advised patient to call back with any issues or concerns.  

## 2020-05-25 NOTE — Telephone Encounter (Signed)
Left message on patients voicemail to please return our call.   

## 2020-05-29 ENCOUNTER — Telehealth: Payer: Self-pay | Admitting: Cardiology

## 2020-05-29 NOTE — Telephone Encounter (Signed)
Eugene Taylor is calling to discuss status of a PA faxed to our office for Arcalyst medication. Please return call to discuss at [phone].

## 2020-05-29 NOTE — Telephone Encounter (Signed)
Spoke to [place] in regards to this patients Prior Auth. She states that she has sent over the instrucstions on how to do the prior auth for this patient. I let her know that I have not gotten this yet but she states that she just sent it so it should be coming through soon. She states to call her back with any questions or concerns.

## 2020-05-31 ENCOUNTER — Encounter: Payer: Self-pay | Admitting: Cardiology

## 2020-06-01 NOTE — Telephone Encounter (Signed)
Spoke to [place] again just now and let her know that I have not received anything from her in regards to doing this prior auth for this patient. She states that she will fax this information to me now.

## 2020-06-01 NOTE — Telephone Encounter (Signed)
I just submitted the prior auth for this patients medication and did contact [place] to let her know. [employer] stated that they have 20 hours to respond to the prior auth and will then fax [place] to let us know their decision.

## 2020-06-02 NOTE — Telephone Encounter (Signed)
Called patient to let him know his prior authorization went through for his Arcalyst and is approved until 08-25-21.  Patient did not answer and there was no voicemail to leave a message.

## 2020-06-23 NOTE — Patient Instructions (Addendum)
DUE TO COVID-19 ONLY ONE VISITOR IS ALLOWED TO COME WITH YOU AND STAY IN THE WAITING ROOM ONLY DURING PRE OP AND PROCEDURE.   IF YOU WILL BE ADMITTED INTO THE HOSPITAL YOU ARE ALLOWED ONE SUPPORT PERSON DURING VISITATION HOURS ONLY (10AM -8PM)   . The support person may change daily. . The support person must pass our screening, gel in and out, and wear a mask at all times, including in the patient's room. . Patients must also wear a mask when staff or their support person are in the room.   COVID SWAB TESTING MUST BE COMPLETED ON:  Tuesday, Nov. 9, 2021 at 3:15 PM   [address] [place] [id number]  (Must self quarantine after testing. Follow instructions on handout.)       Your procedure is scheduled on: Friday, Nov. 12, 2021   [facility]   Report to Short Stay at 5:30 AM   [facility](Map Enclosed)   Call this number if you have problems the morning of surgery [phone]   Do not eat food :After Midnight.   May have liquids until 4:30 AM   day of surgery  CLEAR LIQUID DIET  Foods Allowed                                                                     Foods Excluded  Water, Black Coffee and tea, regular and decaf                             liquids that you cannot  Plain Jell-O in any flavor  (No red)                                           see through such as: Fruit ices (not with fruit pulp)                                     milk, soups, orange juice              Iced Popsicles (No red)                                    All solid food                                   Apple juices Sports drinks like Gatorade (No red) Lightly seasoned clear broth or consume(fat free) Sugar, honey syrup  Sample Menu Breakfast                                Lunch  Supper Cranberry juice                    Beef broth                            Chicken broth Jell-O                                     Grape juice                            Apple juice Coffee or tea                        Jell-O                                      Popsicle                                                Coffee or tea                        Coffee or tea      Oral Hygiene is also important to reduce your risk of infection.                                    Remember - BRUSH YOUR TEETH THE MORNING OF SURGERY WITH YOUR REGULAR TOOTHPASTE   Do NOT smoke after Midnight   Take these medicines the morning of surgery with A SIP OF WATER: Colchicine, Prednisone, Rosuvastatin  DO NOT TAKE ANY ORAL DIABETIC MEDICATIONS DAY OF YOUR SURGERY                               You may not have any metal on your body including  jewelry, and body piercings             Do not wear  lotions, powders, perfumes/cologne, or deodorant                          Men may shave face and neck.   Do not bring valuables to the hospital. [facility] IS NOT             RESPONSIBLE   FOR VALUABLES.   Contacts, dentures or bridgework may not be worn into surgery.   Bring small overnight bag day of surgery.   Special Instructions: Bring a copy of your healthcare power of attorney and living will documents         the day of surgery if you haven't scanned them in before.              Please read over the following fact sheets you were given: IF YOU HAVE QUESTIONS ABOUT YOUR PRE OP INSTRUCTIONS PLEASE CALL [phone]   How to Manage Your Diabetes Before and After Surgery  Why is it important to control my  blood sugar before and after surgery? . Improving blood sugar levels before and after surgery helps healing and can limit problems. . A way of improving blood sugar control is eating a healthy diet by: o  Eating less sugar and carbohydrates o  Increasing activity/exercise o  Talking with your doctor about reaching your blood sugar goals . High blood sugars (greater than 180 mg/dL) can raise your risk of infections and slow your recovery, so you will  need to focus on controlling your diabetes during the weeks before surgery. . Make sure that the doctor who takes care of your diabetes knows about your planned surgery including the date and location.  How do I manage my blood sugar before surgery? . Check your blood sugar at least 4 times a day, starting 2 days before surgery, to make sure that the level is not too high or low. o Check your blood sugar the morning of your surgery when you wake up and every 2 hours until you get to the Short Stay unit. . If your blood sugar is less than 70 mg/dL, you will need to treat for low blood sugar: o Do not take insulin. o Treat a low blood sugar (less than 70 mg/dL) with  cup of clear juice (cranberry or apple), 4 glucose tablets, OR glucose gel. o Recheck blood sugar in 15 minutes after treatment (to make sure it is greater than 70 mg/dL). If your blood sugar is not greater than 70 mg/dL on recheck, call [phone] for further instructions. . Report your blood sugar to the short stay nurse when you get to Short Stay.  . If you are admitted to the hospital after surgery: o Your blood sugar will be checked by the staff and you will probably be given insulin after surgery (instead of oral diabetes medicines) to make sure you have good blood sugar levels. o The goal for blood sugar control after surgery is 80-180 mg/dL.   WHAT DO I DO ABOUT MY DIABETES MEDICATION?  Marland Kitchen Do not take oral diabetes medicines (pills) the morning of surgery.  Reviewed and Endorsed by [facility] Patient Education Committee, August 2015  [facility]- Preparing for Total Shoulder Arthroplasty    Before surgery, you can play an important role. Because skin is not sterile, your skin needs to be as free of germs as possible. You can reduce the number of germs on your skin by using the following products. . Benzoyl Peroxide Gel o Reduces the number of germs present on the skin o Applied twice a day to shoulder area starting  two days before surgery    ==================================================================  Please follow these instructions carefully:  BENZOYL PEROXIDE 5% GEL  Please do not use if you have an allergy to benzoyl peroxide.   If your skin becomes reddened/irritated stop using the benzoyl peroxide.  Starting two days before surgery, apply as follows:  (Wednesday and Thursday)  1. Apply benzoyl peroxide in the morning and at night. Apply after taking a shower. If you are not taking a shower clean entire shoulder front, back, and side along with the armpit with a clean wet washcloth.  2. Place a quarter-sized dollop on your shoulder and rub in thoroughly, making sure to cover the front, back, and side of your shoulder, along with the armpit.   2 days before ____ AM   ____ PM              1 day before ____ AM   ____  PM                         3. Do this twice a day for two days.  (Last application is the night before surgery, AFTER using the CHG soap as described below).  4. Do NOT apply benzoyl peroxide gel on the day of surgery.   [facility] - Preparing for Surgery Before surgery, you can play an important role.  Because skin is not sterile, your skin needs to be as free of germs as possible.  You can reduce the number of germs on your skin by washing with CHG (chlorahexidine gluconate) soap before surgery.  CHG is an antiseptic cleaner which kills germs and bonds with the skin to continue killing germs even after washing. Please DO NOT use if you have an allergy to CHG or antibacterial soaps.  If your skin becomes reddened/irritated stop using the CHG and inform your nurse when you arrive at Short Stay. Do not shave (including legs and underarms) for at least 48 hours prior to the first CHG shower.  You may shave your face/neck.  Please follow these instructions carefully:  1.  Shower with CHG Soap the night before surgery and the  morning of surgery.  2.  If you choose to wash your  hair, wash your hair first as usual with your normal  shampoo.  3.  After you shampoo, rinse your hair and body thoroughly to remove the shampoo.                             4.  Use CHG as you would any other liquid soap.  You can apply chg directly to the skin and wash.  Gently with a scrungie or clean washcloth.  5.  Apply the CHG Soap to your body ONLY FROM THE NECK DOWN.   Do   not use on face/ open                           Wound or open sores. Avoid contact with eyes, ears mouth and   genitals (private parts).                       Wash face,  Genitals (private parts) with your normal soap.             6.  Wash thoroughly, paying special attention to the area where your    surgery  will be performed.  7.  Thoroughly rinse your body with warm water from the neck down.  8.  DO NOT shower/wash with your normal soap after using and rinsing off the CHG Soap.                9.  Pat yourself dry with a clean towel.            10.  Wear clean pajamas.            11.  Place clean sheets on your bed the night of your first shower and do not  sleep with pets. Day of Surgery : Do not apply any lotions/deodorants the morning of surgery.  Please wear clean clothes to the hospital/surgery center.  FAILURE TO FOLLOW THESE INSTRUCTIONS MAY RESULT IN THE CANCELLATION OF YOUR SURGERY  PATIENT SIGNATURE_________________________________  NURSE SIGNATURE__________________________________  ________________________________________________________________________

## 2020-06-23 NOTE — Progress Notes (Addendum)
COVID Vaccine Completed: Yes Date COVID Vaccine completed:  COVID vaccine manufacturer:    Moderna     PCP - Dr. Dina Rich Cardiologist - Dr. [name] last office visit 05/24/20 in epic  Chest x-ray - 03/04/20 in care everywhere EKG - 03/04/20 in chart received from [facility] Stress Test - greater than 2 years in care everywhere ECHO - 06/30/2019 in epic Cardiac Cath - 11/11/18 in epic Pacemaker/ICD device last checked: N/A  Sleep Study - Yes  CPAP - No machine   Fasting Blood Sugar - 75-85 Checks Blood Sugar __1___ times a day  Blood Thinner Instructions: Plavix Aspirin Instructions: N/A Last Dose: N/A  Anesthesia review: recurrent idiopathic pericarditis, OSA, mild Aortic stenosis, HTN, CAD, DM  Patient denies shortness of breath, fever, cough and chest pain at PAT appointment   Patient verbalized understanding of instructions that were given to them at the PAT appointment. Patient was also instructed that they will need to review over the PAT instructions again at home before surgery.

## 2020-06-23 NOTE — Telephone Encounter (Signed)
Spoke to Eugene Taylor just now and he asked if he could start his Arcalyst during his appointment with Dr. Dulce Sellar on 07/11/20. This will be after his shoulder surgery. I told him that this would be fine. He is going to continue his prednisone until he has started the Arcalyst.

## 2020-06-23 NOTE — Telephone Encounter (Signed)
Patient is following up. He states he is having a procedure on his shoulder, scheduled for 07/07/20. He is inquiring about whether or not he will need to hold Arcalyst and Prednisone. The office performing the procedure will also be in contact with our office for clearance request. Patient is requesting a call back from [facility] specifically.

## 2020-06-27 ENCOUNTER — Encounter (HOSPITAL_COMMUNITY)
Admission: RE | Admit: 2020-06-27 | Discharge: 2020-06-27 | Disposition: A | Discharge status: Home / Self Care | Payer: Medicare PPO | Source: Ambulatory Visit | Attending: Orthopedic Surgery | Admitting: Orthopedic Surgery

## 2020-06-27 ENCOUNTER — Encounter (HOSPITAL_COMMUNITY): Payer: Self-pay

## 2020-06-27 ENCOUNTER — Other Ambulatory Visit: Payer: Self-pay

## 2020-06-27 DIAGNOSIS — Z01812 Encounter for preprocedural laboratory examination: Secondary | ICD-10-CM | POA: Insufficient documentation

## 2020-06-27 DIAGNOSIS — M19012 Primary osteoarthritis, left shoulder: Secondary | ICD-10-CM | POA: Insufficient documentation

## 2020-06-27 DIAGNOSIS — I2510 Atherosclerotic heart disease of native coronary artery without angina pectoris: Secondary | ICD-10-CM | POA: Insufficient documentation

## 2020-06-27 DIAGNOSIS — I10 Essential (primary) hypertension: Secondary | ICD-10-CM | POA: Insufficient documentation

## 2020-06-27 DIAGNOSIS — I350 Nonrheumatic aortic (valve) stenosis: Secondary | ICD-10-CM | POA: Diagnosis not present

## 2020-06-27 DIAGNOSIS — Z7952 Long term (current) use of systemic steroids: Secondary | ICD-10-CM | POA: Insufficient documentation

## 2020-06-27 DIAGNOSIS — Z7984 Long term (current) use of oral hypoglycemic drugs: Secondary | ICD-10-CM | POA: Insufficient documentation

## 2020-06-27 DIAGNOSIS — G4733 Obstructive sleep apnea (adult) (pediatric): Secondary | ICD-10-CM | POA: Diagnosis not present

## 2020-06-27 DIAGNOSIS — Z7902 Long term (current) use of antithrombotics/antiplatelets: Secondary | ICD-10-CM | POA: Insufficient documentation

## 2020-06-27 DIAGNOSIS — Z79899 Other long term (current) drug therapy: Secondary | ICD-10-CM | POA: Insufficient documentation

## 2020-06-27 HISTORY — DX: Cardiac murmur, unspecified: R01.1

## 2020-06-27 HISTORY — DX: Fatty (change of) liver, not elsewhere classified: K76.0

## 2020-06-27 LAB — BASIC METABOLIC PANEL
Anion gap: 11 (ref 5–15)
BUN: 21 mg/dL (ref 8–23)
CO2: 24 mmol/L (ref 22–32)
Calcium: 9.3 mg/dL (ref 8.9–10.3)
Chloride: 106 mmol/L (ref 98–111)
Creatinine, Ser: 1.11 mg/dL (ref 0.61–1.24)
GFR, Estimated: >60 mL/min (ref >60)
Glucose, Bld: 80 mg/dL (ref 70–99)
Potassium: 3.6 mmol/L (ref 3.5–5.1)
Sodium: 141 mmol/L (ref 135–145)

## 2020-06-27 LAB — CBC
HCT: 50.5 % (ref 39.0–52.0)
Hemoglobin: 16.3 g/dL (ref 13.0–17.0)
MCH: 29.9 pg (ref 26.0–34.0)
MCHC: 32.3 g/dL (ref 30.0–36.0)
MCV: 92.7 fL (ref 80.0–100.0)
Platelets: 227 10*3/uL (ref 150–400)
RBC: 5.45 MIL/uL (ref 4.22–5.81)
RDW: 16.3 % — ABNORMAL HIGH (ref 11.5–15.5)
WBC: 7.1 10*3/uL (ref 4.0–10.5)
nRBC: 0 % (ref 0.0–0.2)

## 2020-06-27 LAB — SURGICAL PCR SCREEN
MRSA, PCR: NEGATIVE
Staphylococcus aureus: NEGATIVE

## 2020-06-27 LAB — GLUCOSE, CAPILLARY: Glucose-Capillary: 71 mg/dL (ref 70–99)

## 2020-06-27 LAB — HEMOGLOBIN A1C
Hgb A1c MFr Bld: 6.2 % — ABNORMAL HIGH (ref 4.8–5.6)
Mean Plasma Glucose: 131.24 mg/dL

## 2020-06-27 NOTE — H&P (Signed)
Patient's anticipated LOS is less than 2 midnights, meeting these requirements: - Younger than 8 - Lives within 1 hour of care - Has a competent adult at home to recover with post-op recover - NO history of  - Chronic pain requiring opiods  - Diabetes  - Coronary Artery Disease  - Heart failure  - Heart attack  - Stroke  - DVT/VTE  - Cardiac arrhythmia  - Respiratory Failure/COPD  - Renal failure  - Anemia  - Advanced Liver disease       Eugene Taylor is an 69 y.o. male.    Chief Complaint: left shoulder pain  HPI: Pt is a 69 y.o. male complaining of left shoulder pain for multiple years. Pain had continually increased since the beginning. X-rays in the clinic show end-stage arthritic changes of the left shoulder. Pt has tried various conservative treatments which have failed to alleviate their symptoms, including injections and therapy. Various options are discussed with the patient. Risks, benefits and expectations were discussed with the patient. Patient understand the risks, benefits and expectations and wishes to proceed with surgery.   PCP:  Algis Greenhouse, MD  D/C Plans: Home  PMH: Past Medical History:  Diagnosis Date  . Abnormal cardiac CT angiography   . Acute idiopathic pericarditis 04/25/2016   Overview:  2017: hosp, EKG changes, colchicine/indomethacin  . Acute medial meniscal tear 12/15/2014  . Angina pectoris ([place]) 05/05/2018  . Aortic regurgitation 07/07/2018  . Aortic stenosis, mild 07/07/2018  . Arthritis    LEFT SHOULDER  . Asthma    pt unsure  . Benign hypertension 11/27/2015  . Bicuspid aortic valve 05/22/2018  . CAD in native artery 11/11/2018  . Complication of anesthesia    " during a knee surgery , my blood pressure was up & stayed up "  . Coronary artery disease   . Coronary artery disease of native artery of native heart with stable angina pectoris ([place]) 11/09/2018  . Enlarged thoracic aorta ([place]) 05/22/2018  . External hemorrhoid,  thrombosed 12/28/2015   Overview:  2017:   . Fatty liver   . Heart murmur   . History of adenomatous polyp of colon   . History of kidney stones   . Hyperlipidemia   . Hypertension   . Lower respiratory infection 02/08/2016  . Mixed hyperlipidemia 11/27/2015  . Obstructive sleep apnea 11/27/2015  . OSA on CPAP    uses cpap  . 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 Right knee meniscal tear   . Screening for diabetes mellitus (DM) 11/27/2015  . Type 2 diabetes mellitus without complication, without long-term current use of insulin ([facility]) 11/27/2015   Formatting of this note might be different from the original. 2019: 107/6.4 2020: 5.9 2020: 130/7.7  . Wears glasses     PSH: Past Surgical History:  Procedure Laterality Date  . APPENDECTOMY  age 59  . CARDIAC CATHETERIZATION  11/11/2018  . CHONDROPLASTY Right 12/15/2014   Procedure: CHONDROPLASTY;  Surgeon: Gaynelle Arabian, MD;  Location: [facility];  Service: Orthopedics;  Laterality: Right;  . COLONOSCOPY W/ POLYPECTOMY  2011  . CORONARY STENT INTERVENTION  11/11/2018  . CORONARY STENT INTERVENTION N/A 11/11/2018   Procedure: CORONARY STENT INTERVENTION;  Surgeon: Troy Sine, MD;  Location: [facility];  Service: Cardiovascular;  Laterality: N/A;  . CYSTO/  URETEROSCOPIC STONE EXTRACTIONS  1995  . KNEE ARTHROSCOPY WITH LATERAL MENISECTOMY Right 12/15/2014   Procedure: KNEE ARTHROSCOPY WITH LATERAL MENISECTOMY;  Surgeon: Gaynelle Arabian,  MD;  Location: [facility];  Service: Orthopedics;  Laterality: Right;  . KNEE ARTHROSCOPY WITH MEDIAL MENISECTOMY Right 12/15/2014   Procedure: KNEE ARTHROSCOPY WITH MEDIAL MENISECTOMY;  Surgeon: Gaynelle Arabian, MD;  Location: [facility];  Service: Orthopedics;  Laterality: Right;  . LEFT HEART CATH AND CORONARY ANGIOGRAPHY N/A 11/11/2018   Procedure: LEFT HEART CATH AND CORONARY ANGIOGRAPHY;  Surgeon: Troy Sine, MD;  Location: [facility];   Service: Cardiovascular;  Laterality: N/A;  . NASAL SEPTUM SURGERY  1995  . TOTAL KNEE ARTHROPLASTY Right     Social History:  reports that he has never smoked. He has never used smokeless tobacco. He reports that he does not drink alcohol and does not use drugs.  Allergies:  Allergies  Allergen Reactions  . Other Anaphylaxis    Red Meat  . Penicillins Other (See Comments)    Did it involve swelling of the face/tongue/throat, SOB, or low BP? Unknown Did it involve sudden or severe rash/hives, skin peeling, or any reaction on the inside of your mouth or nose? Unknown Did you need to seek medical attention at a hospital or doctor's office? Unknown When did it last happen?Childhood allergy If all above answers are "NO", may proceed with cephalosporin use.   Marland Kitchen Hydrocodeine [Dihydrocodeine] Itching    Medications: No current facility-administered medications for this encounter.   Current Outpatient Medications  Medication Sig Dispense Refill  . acetaminophen (TYLENOL) 500 MG tablet Take 500 mg by mouth daily as needed (before golfing.).    Marland Kitchen b complex vitamins tablet Take 1 tablet by mouth daily.    . cholecalciferol (VITAMIN D3) 25 MCG (1000 UT) tablet Take 1,000 Units by mouth daily.    . clopidogrel (PLAVIX) 75 MG tablet Take 1 tablet (75 mg total) by mouth daily. 90 tablet 3  . Colchicine (MITIGARE) 0.6 MG CAPS Take 0.6 mg by mouth daily. 180 capsule 3  . EPINEPHrine 0.3 mg/0.3 mL IJ SOAJ injection Use as directed for life-threatening allergic reaction. (Patient taking differently: Inject 0.3 mg into the muscle as needed for anaphylaxis (meat allergy (alpha gal)). ) 1 each 3  . ibuprofen (ADVIL) 200 MG tablet Take 400 mg by mouth in the morning and at bedtime.    Marland Kitchen JARDIANCE 25 MG TABS tablet Take 25 mg by mouth daily.    . nitroGLYCERIN (NITROSTAT) 0.4 MG SL tablet Place 1 tablet (0.4 mg total) under the tongue every 5 (five) minutes as needed for chest pain. 100 tablet 3   . predniSONE (DELTASONE) 20 MG tablet Take 1 tablet (20 mg total) by mouth daily with breakfast. 90 tablet 3  . rosuvastatin (CRESTOR) 20 MG tablet Take 20 mg by mouth daily.    Marland Kitchen telmisartan (MICARDIS) 20 MG tablet Take 0.5 tablets (10 mg total) by mouth daily. 15 tablet 3  . clindamycin (CLEOCIN) 300 MG capsule Take 2 capsules (600 mg total) by mouth as directed. Take 2 capsules (615m) 45 minutes prior to dental visits 10 capsule 3  . glucose monitoring kit (FREESTYLE) monitoring kit 1 each by Does not apply route as needed for other. 1 each 0    Results for orders placed or performed during the hospital encounter of 06/27/20 (from the past 48 hour(s))  Glucose, capillary     Status: None   Collection Time: 06/27/20  8:02 AM  Result Value Ref Range   Glucose-Capillary 71 70 - 99 mg/dL    Comment: Glucose reference range applies only  to samples taken after fasting for at least 8 hours.  Basic metabolic panel per protocol     Status: None   Collection Time: 06/27/20  8:37 AM  Result Value Ref Range   Sodium 141 135 - 145 mmol/L   Potassium 3.6 3.5 - 5.1 mmol/L   Chloride 106 98 - 111 mmol/L   CO2 24 22 - 32 mmol/L   Glucose, Bld 80 70 - 99 mg/dL    Comment: Glucose reference range applies only to samples taken after fasting for at least 8 hours.   BUN 21 8 - 23 mg/dL   Creatinine, Ser 1.11 0.61 - 1.24 mg/dL   Calcium 9.3 8.9 - 10.3 mg/dL   GFR, Estimated >60 >60 mL/min    Comment: (NOTE) Calculated using the CKD-EPI Creatinine Equation (2021)    Anion gap 11 5 - 15    Comment: Performed at [facility], [place] [address], [place], [place] [address]  CBC per protocol     Status: Abnormal   Collection Time: 06/27/20  8:37 AM  Result Value Ref Range   WBC 7.1 4.0 - 10.5 K/uL   RBC 5.45 4.22 - 5.81 MIL/uL   Hemoglobin 16.3 13.0 - 17.0 g/dL   HCT 50.5 39 - 52 %   MCV 92.7 80.0 - 100.0 fL   MCH 29.9 26.0 - 34.0 pg   MCHC 32.3 30.0 - 36.0 g/dL   RDW 16.3 (H) 11.5 -  15.5 %   Platelets 227 150 - 400 K/uL   nRBC 0.0 0.0 - 0.2 %    Comment: Performed at [facility], [place] [address] [place], [place] [address]   No results found.  ROS: Pain with rom of the left upper extremity  Physical Exam: Alert and oriented 69 y.o. male in no acute distress Cranial nerves 2-12 intact Cervical spine: full rom with no tenderness, nv intact distally Chest: active breath sounds bilaterally, no wheeze rhonchi or rales Heart: regular rate and rhythm, no murmur Abd: non tender non distended with active bowel sounds Hip is stable with rom  Left shoulder painful rom and weakness with ER and IR No rashes or edema distally  Assessment/Plan Assessment: left shoulder cuff arthropathy  Plan:  Patient will undergo a left reverse total shoulder by Dr. Veverly Fells at [facility]. Risks benefits and expectations were discussed with the patient. Patient understand risks, benefits and expectations and wishes to proceed. Preoperative templating of the joint replacement has been completed, documented, and submitted to the Operating Room personnel in order to optimize intra-operative equipment management.   Merla Riches PA-C, MPAS [facility] is now [employer] [facility] [address], [place], [place], [place] [address] Phone: [phone] [email or web address] Facebook  [employer]

## 2020-06-28 ENCOUNTER — Ambulatory Visit: Payer: Medicare PPO | Admitting: Cardiology

## 2020-06-28 ENCOUNTER — Encounter (HOSPITAL_COMMUNITY): Payer: Self-pay | Admitting: Physician Assistant

## 2020-06-28 NOTE — Progress Notes (Signed)
Anesthesia Chart Review   Case: [id number] Date/Time: 07/07/20 0715   Procedure: REVERSE SHOULDER ARTHROPLASTY (Left Shoulder) - interscalene block   Anesthesia type: General   Pre-op diagnosis: Left shoulder osteoarthritis rotator cuff insufficiency   Location: [facility] ROOM 06 / [facility]   Surgeons: Netta Cedars, MD      DISCUSSION:69 y.o. never smoker with h/o HTN, OSA, CAD, mild AS, recurrent idiopathic pericarditis, left shoulder OA and rotator cuff insufficiency scheduled for above procedure 07/07/2020 with Dr. Netta Cedars.   Per cardiology preoperative risk assessment 04/12/2020, "Chart reviewed as part of pre-operative protocol coverage. Given past medical history and time since last visit, based on ACC/AHA guidelines, Xzaviar Maloof Audino would be at acceptable risk for the planned procedure without further cardiovascular testing."  Pt with pericarditis flare up 05/10/2020 after daily steroid dosage reduced, increased to 20 mg with improvement.  C-reactive protein and sedimentation rate normalized.  Per follow up OV 05/24/2020 pt steroid dependent.  Will start on rilonacept after surgery.   Pt advised to stay on Prednisone, will start rilonacept after surgery.  He will hold Plavix 7 days prior to surgery.   Anticipate pt can proceed with planned procedure barring acute status change and after evaluation DOS.  VS: BP (!) 133/91   Pulse 71   Temp 36.5 C (Oral)   Resp 16   Ht _0  (1.803 m)   Wt 85.4 kg   SpO2 97%   BMI 26.25 kg/m   PROVIDERS: Algis Greenhouse, MD is PCP   Shirlee More, MD is Cardiologist  LABS: Labs reviewed: Acceptable for surgery. (all labs ordered are listed, but only abnormal results are displayed)  Labs Reviewed  HEMOGLOBIN A1C - Abnormal; Notable for the following components:      Result Value   Hgb A1c MFr Bld 6.2 (*)    All other components within normal limits  CBC - Abnormal; Notable for the following components:   RDW 16.3 (*)    All other components  within normal limits  SURGICAL PCR SCREEN  GLUCOSE, CAPILLARY  BASIC METABOLIC PANEL     IMAGES:   EKG:   CV: Echo 06/30/2019 IMPRESSIONS    1. Left ventricular ejection fraction, by visual estimation, is 60 to  65%. The left ventricle has normal function. There is mildly increased  left ventricular hypertrophy.  2. Left ventricular diastolic parameters are consistent with Grade I  diastolic dysfunction (impaired relaxation).  3. Global right ventricle has normal systolic function.The right  ventricular size is normal. No increase in right ventricular wall  thickness.  4. Left atrial size was normal.  5. Right atrial size was normal.  6. The mitral valve is normal in structure. No evidence of mitral valve  regurgitation. No evidence of mitral stenosis.  7. The tricuspid valve is normal in structure. Tricuspid valve  regurgitation is not demonstrated.  8. The aortic valve is normal in structure. Aortic valve regurgitation is  mild. Mild aortic valve stenosis.  9. The pulmonic valve was normal in structure. Pulmonic valve  regurgitation is not visualized.  10. The inferior vena cava is normal in size with greater than 50%  respiratory variability, suggesting right atrial pressure of 3 mmHg.  11. Pericardium normal without effusion.  Past Medical History:  Diagnosis Date  . Abnormal cardiac CT angiography   . Acute idiopathic pericarditis 04/25/2016   Overview:  2017: hosp, EKG changes, colchicine/indomethacin  . Acute medial meniscal tear 12/15/2014  . Angina pectoris ([place]) 05/05/2018  .  Aortic regurgitation 07/07/2018  . Aortic stenosis, mild 07/07/2018  . Arthritis    LEFT SHOULDER  . Asthma    pt unsure  . Benign hypertension 11/27/2015  . Bicuspid aortic valve 05/22/2018  . CAD in native artery 11/11/2018  . Complication of anesthesia    " during a knee surgery , my blood pressure was up & stayed up "  . Coronary artery disease   . Coronary artery disease  of native artery of native heart with stable angina pectoris ([place]) 11/09/2018  . Enlarged thoracic aorta ([place]) 05/22/2018  . External hemorrhoid, thrombosed 12/28/2015   Overview:  2017:   . Fatty liver   . Heart murmur   . History of adenomatous polyp of colon   . History of kidney stones   . Hyperlipidemia   . Hypertension   . Lower respiratory infection 02/08/2016  . Mixed hyperlipidemia 11/27/2015  . Obstructive sleep apnea 11/27/2015  . OSA on CPAP    uses cpap  . 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 Right knee meniscal tear   . Screening for diabetes mellitus (DM) 11/27/2015  . Type 2 diabetes mellitus without complication, without long-term current use of insulin ([place]) 11/27/2015   Formatting of this note might be different from the original. 2019: 107/6.4 2020: 5.9 2020: 130/7.7  . Wears glasses     Past Surgical History:  Procedure Laterality Date  . APPENDECTOMY  age 72  . CARDIAC CATHETERIZATION  11/11/2018  . CHONDROPLASTY Right 12/15/2014   Procedure: CHONDROPLASTY;  Surgeon: Gaynelle Arabian, MD;  Location: [facility];  Service: Orthopedics;  Laterality: Right;  . COLONOSCOPY W/ POLYPECTOMY  2011  . CORONARY STENT INTERVENTION  11/11/2018  . CORONARY STENT INTERVENTION N/A 11/11/2018   Procedure: CORONARY STENT INTERVENTION;  Surgeon: Troy Sine, MD;  Location: [facility];  Service: Cardiovascular;  Laterality: N/A;  . CYSTO/  URETEROSCOPIC STONE EXTRACTIONS  1995  . KNEE ARTHROSCOPY WITH LATERAL MENISECTOMY Right 12/15/2014   Procedure: KNEE ARTHROSCOPY WITH LATERAL MENISECTOMY;  Surgeon: Gaynelle Arabian, MD;  Location: [facility];  Service: Orthopedics;  Laterality: Right;  . KNEE ARTHROSCOPY WITH MEDIAL MENISECTOMY Right 12/15/2014   Procedure: KNEE ARTHROSCOPY WITH MEDIAL MENISECTOMY;  Surgeon: Gaynelle Arabian, MD;  Location: [facility];  Service: Orthopedics;  Laterality: Right;  . LEFT HEART CATH AND CORONARY  ANGIOGRAPHY N/A 11/11/2018   Procedure: LEFT HEART CATH AND CORONARY ANGIOGRAPHY;  Surgeon: Troy Sine, MD;  Location: [facility];  Service: Cardiovascular;  Laterality: N/A;  . NASAL SEPTUM SURGERY  1995  . TOTAL KNEE ARTHROPLASTY Right     MEDICATIONS: . acetaminophen (TYLENOL) 500 MG tablet  . b complex vitamins tablet  . cholecalciferol (VITAMIN D3) 25 MCG (1000 UT) tablet  . clindamycin (CLEOCIN) 300 MG capsule  . clopidogrel (PLAVIX) 75 MG tablet  . Colchicine (MITIGARE) 0.6 MG CAPS  . EPINEPHrine 0.3 mg/0.3 mL IJ SOAJ injection  . glucose monitoring kit (FREESTYLE) monitoring kit  . ibuprofen (ADVIL) 200 MG tablet  . JARDIANCE 25 MG TABS tablet  . nitroGLYCERIN (NITROSTAT) 0.4 MG SL tablet  . predniSONE (DELTASONE) 20 MG tablet  . rosuvastatin (CRESTOR) 20 MG tablet  . telmisartan (MICARDIS) 20 MG tablet   No current facility-administered medications for this encounter.     Konrad Felix, PA-C [facility]Surgical Testing [phone]

## 2020-06-30 ENCOUNTER — Other Ambulatory Visit: Payer: Self-pay

## 2020-06-30 MED ORDER — ARCALYST 220 MG ~~LOC~~ SOLR
1.0000 | SUBCUTANEOUS | 3 refills | Status: DC
Start: 2020-06-30 — End: 2021-05-02

## 2020-07-04 ENCOUNTER — Other Ambulatory Visit (HOSPITAL_COMMUNITY): Payer: Medicare PPO

## 2020-07-06 DIAGNOSIS — K760 Fatty (change of) liver, not elsewhere classified: Secondary | ICD-10-CM | POA: Insufficient documentation

## 2020-07-06 DIAGNOSIS — R011 Cardiac murmur, unspecified: Secondary | ICD-10-CM | POA: Insufficient documentation

## 2020-07-07 ENCOUNTER — Encounter (HOSPITAL_COMMUNITY): Admission: RE | Payer: Self-pay | Source: Home / Self Care

## 2020-07-07 ENCOUNTER — Ambulatory Visit (HOSPITAL_COMMUNITY): Admission: RE | Admit: 2020-07-07 | Payer: Medicare PPO | Source: Home / Self Care | Admitting: Orthopedic Surgery

## 2020-07-07 SURGERY — ARTHROPLASTY, SHOULDER, TOTAL, REVERSE
Anesthesia: General | Site: Shoulder | Laterality: Left

## 2020-07-11 ENCOUNTER — Encounter: Payer: Self-pay | Admitting: Cardiology

## 2020-07-11 ENCOUNTER — Other Ambulatory Visit: Payer: Self-pay

## 2020-07-11 ENCOUNTER — Ambulatory Visit: Payer: Medicare PPO | Admitting: Cardiology

## 2020-07-11 VITALS — BP 132/90 | HR 80 | Ht 71.0 in | Wt 188.0 lb

## 2020-07-11 DIAGNOSIS — I300 Acute nonspecific idiopathic pericarditis: Secondary | ICD-10-CM

## 2020-07-11 NOTE — Patient Instructions (Signed)
Medication Instructions:  Your physician recommends that you continue on your current medications as directed. Please refer to the Current Medication list given to you today.  *If you need a refill on your cardiac medications before your next appointment, please call your pharmacy*   Lab Work: Your physician recommends that you return for lab work in: 1 WEEK CRP, SED RATE If you have labs (blood work) drawn today and your tests are completely normal, you will receive your results only by: Marland Kitchen MyChart Message (if you have MyChart) OR . A paper copy in the mail If you have any lab test that is abnormal or we need to change your treatment, we will call you to review the results.   Testing/Procedures: None   Follow-Up: At [facility], you and your health needs are our priority.  As part of our continuing mission to provide you with exceptional heart care, we have created designated Provider Care Teams.  These Care Teams include your primary Cardiologist (physician) and Advanced Practice Providers (APPs -  Physician Assistants and Nurse Practitioners) who all work together to provide you with the care you need, when you need it.  We recommend signing up for the patient portal called "MyChart".  Sign up information is provided on this After Visit Summary.  MyChart is used to connect with patients for Virtual Visits (Telemedicine).  Patients are able to view lab/test results, encounter notes, upcoming appointments, etc.  Non-urgent messages can be sent to your provider as well.   To learn more about what you can do with MyChart, go to [email or web address].    Your next appointment:   3 week(s)  The format for your next appointment:   In Person  Provider:   [name], MD   Other Instructions

## 2020-07-11 NOTE — Progress Notes (Signed)
Cardiology Office Note:    Date:  07/11/2020   ID:  Eugene Taylor, DOB [birth year], MRN [id number]  PCP:  Algis Greenhouse, MD  Cardiologist:  Shirlee More, MD    Referring MD: Algis Greenhouse, MD    ASSESSMENT:    1. Recurrent idiopathic pericarditis    PLAN:    In order of problems listed above:  1. He will receive his first dose rilonacept in the office today monitored 1 hour as recommended with concerns of hypersensitivity reaction.  1 week check CRP sedimentation rate and consider down titration of his steroids typically off by 12 weeks 2. Other problems cardiac stable no change in meds   Next appointment: 3 weeks   Medication Adjustments/Labs and Tests Ordered: Current medicines are reviewed at length with the patient today.  Concerns regarding medicines are outlined above.  Orders Placed This Encounter  Procedures  . Sed Rate (ESR)  . CRP High sensitivity  . EKG 12-Lead   No orders of the defined types were placed in this encounter.   Chief Complaint  Patient presents with  . Follow-up    To initiate treatment rilonacept for pericarditis    History of Present Illness:    Eugene Taylor is a 69 y.o. male with a hx of recurrent pericarditis on corticosteroids CAD with PCI and stent to proximal left circumflex 11/11/2018, mild aortic stenosis and regurgitation, mild enlargement ascending aorta hypertensive heart disease and hyper lipidemia.  He was last seen 05/25/2019 with another recurrence of pericarditis with steroid weaning has been brought to the office to initiate therapy with rilonacept..  Fortunately on his current steroid and nonsteroidal therapy he is not having pericardial symptoms physical examination normal vital signs and no rub.  Because of the rare occurrence of hypersensitivity first dose will be given in my office today 320 mg.  He will be monitored for 1 hour.  Compliance with diet, lifestyle and medications: Yes Past Medical History:   Diagnosis Date  . Abnormal cardiac CT angiography   . Acute idiopathic pericarditis 04/25/2016   Overview:  2017: hosp, EKG changes, colchicine/indomethacin  . Acute medial meniscal tear 12/15/2014  . Angina pectoris ([place]) 05/05/2018  . Aortic regurgitation 07/07/2018  . Aortic stenosis, mild 07/07/2018  . Arthritis    LEFT SHOULDER  . Asthma    pt unsure  . Benign hypertension 11/27/2015  . Bicuspid aortic valve 05/22/2018  . CAD in native artery 11/11/2018  . Complication of anesthesia    " during a knee surgery , my blood pressure was up & stayed up "  . Coronary artery disease   . Coronary artery disease of native artery of native heart with stable angina pectoris ([place]) 11/09/2018  . Enlarged thoracic aorta ([place]) 05/22/2018  . External hemorrhoid, thrombosed 12/28/2015   Overview:  2017:   . Fatty liver   . Heart murmur   . History of adenomatous polyp of colon   . History of kidney stones   . Hyperlipidemia   . Hypertension   . Lower respiratory infection 02/08/2016  . Mixed hyperlipidemia 11/27/2015  . Obstructive sleep apnea 11/27/2015  . OSA on CPAP    uses cpap  . 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 Right knee meniscal tear   . Screening for diabetes mellitus (DM) 11/27/2015  . Type 2 diabetes mellitus without complication, without long-term current use of insulin ([place]) 11/27/2015   Formatting of this note might be different from the original. 2019: 107/6.4 2020:  5.9 2020: 130/7.7  . Wears glasses     Past Surgical History:  Procedure Laterality Date  . APPENDECTOMY  age 39  . CARDIAC CATHETERIZATION  11/11/2018  . CHONDROPLASTY Right 12/15/2014   Procedure: CHONDROPLASTY;  Surgeon: Gaynelle Arabian, MD;  [facility]: [facility];  Service: Orthopedics;  Laterality: Right;  . COLONOSCOPY W/ POLYPECTOMY  2011  . CORONARY STENT INTERVENTION  11/11/2018  . CORONARY STENT INTERVENTION N/A 11/11/2018   Procedure: CORONARY STENT INTERVENTION;  Surgeon: Troy Sine, MD;  Location: [place] [facility];  Service: Cardiovascular;  Laterality: N/A;  . CYSTO/  URETEROSCOPIC STONE EXTRACTIONS  1995  . KNEE ARTHROSCOPY WITH LATERAL MENISECTOMY Right 12/15/2014   Procedure: KNEE ARTHROSCOPY WITH LATERAL MENISECTOMY;  Surgeon: Gaynelle Arabian, MD;  Location: [facility];  Service: Orthopedics;  Laterality: Right;  . KNEE ARTHROSCOPY WITH MEDIAL MENISECTOMY Right 12/15/2014   Procedure: KNEE ARTHROSCOPY WITH MEDIAL MENISECTOMY;  Surgeon: Gaynelle Arabian, MD;  Location: [facility];  Service: Orthopedics;  Laterality: Right;  . LEFT HEART CATH AND CORONARY ANGIOGRAPHY N/A 11/11/2018   Procedure: LEFT HEART CATH AND CORONARY ANGIOGRAPHY;  Surgeon: Troy Sine, MD;  Location: [place] [facility];  Service: Cardiovascular;  Laterality: N/A;  . NASAL SEPTUM SURGERY  1995  . TOTAL KNEE ARTHROPLASTY Right     Current Medications: Current Meds  Medication Sig  . acetaminophen (TYLENOL) 500 MG tablet Take 500 mg by mouth daily as needed (before golfing.).  Marland Kitchen ARCALYST 220 MG SOLR Inject 1 each into the skin every 7 (seven) days.  Marland Kitchen b complex vitamins tablet Take 1 tablet by mouth daily.  . cholecalciferol (VITAMIN D3) 25 MCG (1000 UT) tablet Take 1,000 Units by mouth daily.  . clindamycin (CLEOCIN) 300 MG capsule Take 2 capsules (600 mg total) by mouth as directed. Take 2 capsules (659m) 45 minutes prior to dental visits  . clopidogrel (PLAVIX) 75 MG tablet Take 1 tablet (75 mg total) by mouth daily.  . Colchicine (MITIGARE) 0.6 MG CAPS Take 0.6 mg by mouth daily.  .Marland KitchenEPINEPHrine 0.3 mg/0.3 mL IJ SOAJ injection Use as directed for life-threatening allergic reaction. (Patient taking differently: Inject 0.3 mg into the muscle as needed for anaphylaxis (meat allergy (alpha gal)). )  . glucose monitoring kit (FREESTYLE) monitoring kit 1 each by Does not apply route as needed for other.  . ibuprofen (ADVIL) 200 MG tablet Take 400 mg by mouth in  the morning and at bedtime.  .Marland KitchenJARDIANCE 25 MG TABS tablet Take 25 mg by mouth daily.  . nitroGLYCERIN (NITROSTAT) 0.4 MG SL tablet Place 1 tablet (0.4 mg total) under the tongue every 5 (five) minutes as needed for chest pain.  . predniSONE (DELTASONE) 20 MG tablet Take 1 tablet (20 mg total) by mouth daily with breakfast.  . rosuvastatin (CRESTOR) 20 MG tablet Take 20 mg by mouth daily.  .Marland Kitchentelmisartan (MICARDIS) 20 MG tablet Take 0.5 tablets (10 mg total) by mouth daily.     Allergies:   Other, Penicillins, and Hydrocodeine [dihydrocodeine]   Social History   Socioeconomic History  . Marital status: Married    Spouse name: Not on file  . Number of children: Not on file  . Years of education: Not on file  . Highest education level: Not on file  Occupational History  . Not on file  Tobacco Use  . Smoking status: Never Smoker  . Smokeless tobacco: Never Used  Vaping Use  .  Vaping Use: Never used  Substance and Sexual Activity  . Alcohol use: No  . Drug use: No  . Sexual activity: Not on file  Other Topics Concern  . Not on file  Social History Narrative  . Not on file   Social Determinants of Health   Financial Resource Strain:   . Difficulty of Paying Living Expenses: Not on file  Food Insecurity:   . Worried About Charity fundraiser in the Last Year: Not on file  . Ran Out of Food in the Last Year: Not on file  Transportation Needs:   . Lack of Transportation (Medical): Not on file  . Lack of Transportation (Non-Medical): Not on file  Physical Activity:   . Days of Exercise per Week: Not on file  . Minutes of Exercise per Session: Not on file  Stress:   . Feeling of Stress : Not on file  Social Connections:   . Frequency of Communication with Friends and Family: Not on file  . Frequency of Social Gatherings with Friends and Family: Not on file  . Attends Religious Services: Not on file  . Active Member of Clubs or Organizations: Not on file  . Attends Theatre manager Meetings: Not on file  . Marital Status: Not on file     Family History: The patient's family history includes Pancreatic cancer in his father; Stomach cancer in his mother. ROS:   Please see the history of present illness.    All other systems reviewed and are negative.  EKGs/Labs/Other Studies Reviewed:    The following studies were reviewed today:  EKG:  EKG ordered today and personally reviewed.  The ekg ordered today demonstrates normal sinus rhythm normal EKG  Recent Labs: 05/24/2020: ALT 25 06/27/2020: BUN 21; Creatinine, Ser 1.11; Hemoglobin 16.3; Platelets 227; Potassium 3.6; Sodium 141  Recent Lipid Panel    Component Value Date/Time   CHOL 131 05/24/2020 0900   TRIG 192 (H) 05/24/2020 0900   HDL 61 05/24/2020 0900   CHOLHDL 2.1 05/24/2020 0900   LDLCALC 40 05/24/2020 0900    Physical Exam:    VS:  BP 132/90   Pulse 80   Ht 5' 11"  (1.803 m)   Wt 188 lb (85.3 kg)   SpO2 97%   BMI 26.22 kg/m     Wt Readings from Last 3 Encounters:  07/11/20 188 lb (85.3 kg)  06/27/20 188 lb 3.2 oz (85.4 kg)  05/24/20 185 lb (83.9 kg)     GEN:  Well nourished, well developed in no acute distress HEENT: Normal NECK: No JVD; No carotid bruits LYMPHATICS: No lymphadenopathy CARDIAC: He has no rub RRR, no murmurs, rubs, gallops RESPIRATORY:  Clear to auscultation without rales, wheezing or rhonchi  ABDOMEN: Soft, non-tender, non-distended MUSCULOSKELETAL:  No edema; No deformity  SKIN: Warm and dry NEUROLOGIC:  Alert and oriented x 3 PSYCHIATRIC:  Normal affect    Signed, Shirlee More, MD  07/11/2020 3:32 PM    [facility]

## 2020-07-13 DIAGNOSIS — Z87442 Personal history of urinary calculi: Secondary | ICD-10-CM | POA: Insufficient documentation

## 2020-07-13 DIAGNOSIS — J45909 Unspecified asthma, uncomplicated: Secondary | ICD-10-CM | POA: Insufficient documentation

## 2020-07-13 DIAGNOSIS — S83206A Unspecified tear of unspecified meniscus, current injury, right knee, initial encounter: Secondary | ICD-10-CM | POA: Insufficient documentation

## 2020-07-13 DIAGNOSIS — Z86010 Personal history of colonic polyps: Secondary | ICD-10-CM | POA: Insufficient documentation

## 2020-07-18 DIAGNOSIS — K760 Fatty (change of) liver, not elsewhere classified: Secondary | ICD-10-CM | POA: Insufficient documentation

## 2020-07-19 ENCOUNTER — Telehealth: Payer: Self-pay

## 2020-07-19 DIAGNOSIS — I10 Essential (primary) hypertension: Secondary | ICD-10-CM

## 2020-07-19 DIAGNOSIS — I300 Acute nonspecific idiopathic pericarditis: Secondary | ICD-10-CM

## 2020-07-19 LAB — CBC
Hematocrit: 51.6 % — ABNORMAL HIGH (ref 37.5–51.0)
Hemoglobin: 17.3 g/dL (ref 13.0–17.7)
MCH: 29.7 pg (ref 26.6–33.0)
MCHC: 33.5 g/dL (ref 31.5–35.7)
MCV: 89 fL (ref 79–97)
Platelets: 237 10*3/uL (ref 150–450)
RBC: 5.82 x10E6/uL — ABNORMAL HIGH (ref 4.14–5.80)
RDW: 15.4 % (ref 11.6–15.4)
WBC: 8.9 10*3/uL (ref 3.4–10.8)

## 2020-07-19 LAB — SEDIMENTATION RATE: Sed Rate: 12 mm/hr (ref 0–30)

## 2020-07-19 LAB — HIGH SENSITIVITY CRP: CRP, High Sensitivity: 0.7 mg/L (ref 0.00–3.00)

## 2020-07-19 MED ORDER — TELMISARTAN 20 MG PO TABS
10.0000 mg | ORAL_TABLET | Freq: Every day | ORAL | 3 refills | Status: DC
Start: 2020-07-19 — End: 2020-11-06

## 2020-07-19 NOTE — Telephone Encounter (Signed)
Spoke with patient regarding results and recommendation.  Patient verbalizes understanding and is agreeable to plan of care. Advised patient to call back with any issues or concerns.  

## 2020-07-19 NOTE — Telephone Encounter (Signed)
-----   Message from Baldo Daub, MD sent at 07/19/2020 12:04 PM EST ----- Marked improvement lets reduce his prednisone dose by 50%.  Recheck the same parameters 2 weeks

## 2020-08-03 ENCOUNTER — Encounter: Payer: Self-pay | Admitting: Cardiology

## 2020-08-03 ENCOUNTER — Other Ambulatory Visit: Payer: Self-pay

## 2020-08-03 ENCOUNTER — Ambulatory Visit: Payer: Medicare PPO | Admitting: Cardiology

## 2020-08-03 VITALS — BP 137/88 | HR 78 | Ht 71.0 in | Wt 193.0 lb

## 2020-08-03 DIAGNOSIS — I25118 Atherosclerotic heart disease of native coronary artery with other forms of angina pectoris: Secondary | ICD-10-CM | POA: Diagnosis not present

## 2020-08-03 DIAGNOSIS — I10 Essential (primary) hypertension: Secondary | ICD-10-CM

## 2020-08-03 DIAGNOSIS — I300 Acute nonspecific idiopathic pericarditis: Secondary | ICD-10-CM | POA: Diagnosis not present

## 2020-08-03 DIAGNOSIS — E782 Mixed hyperlipidemia: Secondary | ICD-10-CM | POA: Diagnosis not present

## 2020-08-03 DIAGNOSIS — E119 Type 2 diabetes mellitus without complications: Secondary | ICD-10-CM

## 2020-08-03 NOTE — Patient Instructions (Signed)
Medication Instructions:  Your physician has recommended you make the following change in your medication:  DECREASE: Prednisone to 1/2 tablet by mouth every other day for 5 days and then please stop this medications.  *If you need a refill on your cardiac medications before your next appointment, please call your pharmacy*   Lab Work: Your physician recommends that you return for lab work in: 2 weeks CBC, CRP, Sed rate If you have labs (blood work) drawn today and your tests are completely normal, you will receive your results only by: Marland Kitchen MyChart Message (if you have MyChart) OR . A paper copy in the mail If you have any lab test that is abnormal or we need to change your treatment, we will call you to review the results.   Testing/Procedures: None   Follow-Up: At [facility], you and your health needs are our priority.  As part of our continuing mission to provide you with exceptional heart care, we have created designated Provider Care Teams.  These Care Teams include your primary Cardiologist (physician) and Advanced Practice Providers (APPs -  Physician Assistants and Nurse Practitioners) who all work together to provide you with the care you need, when you need it.  We recommend signing up for the patient portal called "MyChart".  Sign up information is provided on this After Visit Summary.  MyChart is used to connect with patients for Virtual Visits (Telemedicine).  Patients are able to view lab/test results, encounter notes, upcoming appointments, etc.  Non-urgent messages can be sent to your provider as well.   To learn more about what you can do with MyChart, go to [email or web address].    Your next appointment:   1 month(s)  The format for your next appointment:   In Person  Provider:   [name], MD   Other Instructions

## 2020-08-03 NOTE — Progress Notes (Signed)
Cardiology Office Note:    Date:  08/03/2020   ID:  Eugene Taylor, DOB 09/30/1950, MRN [id number]  PCP:  Algis Greenhouse, MD  Cardiologist:  Shirlee More, MD    Referring MD: Algis Greenhouse, MD    ASSESSMENT:    1. Recurrent idiopathic pericarditis   2. Essential hypertension   3. Mixed hyperlipidemia   4. Coronary artery disease of native artery of native heart with stable angina pectoris ([place])   5. Type 2 diabetes mellitus without complication, without long-term current use of insulin (HCC)    PLAN:    In order of problems listed above:  His course is favorable our goal is to get him off of steroids in the short-term and continue treatment full course rilonacept sipped 1 year.  Continue to follow his inflammatory markers and consider withdrawing nonsteroidal anti-inflammatory drug next visit continue colchicine. Hypertension hyperlipidemia CAD and type 2 diabetes are stable no change in treatment  Next appointment: 4 weeks   Medication Adjustments/Labs and Tests Ordered: Current medicines are reviewed at length with the patient today.  Concerns regarding medicines are outlined above.  Orders Placed This Encounter  Procedures  . CBC  . High sensitivity CRP  . Sedimentation rate   No orders of the defined types were placed in this encounter.   No chief complaint on file.   History of Present Illness:    Eugene Taylor is a 69 y.o. male with a hx of recurrent idiopathic pericarditis requiring steroid therapy initiated on Renvela sipped 09/11/2019.  He has a history ofCAD with PCI and stent to proximal left circumflex 11/11/2018, mild aortic stenosis and regurgitation, mild enlargement ascending aorta hypertensive heart disease and hyperlipidemia.   Compliance with diet, lifestyle and medications: Yes  He has had 5 symptoms of low normal except.  No side effects and has had no recurrence of pericardial symptoms.  In general he feels well no fever or chill pleuritic  chest pain shortness of breath.  We had reduced his steroids by 50% we will go every other day for 5 days and withdraw steroid therapy.  Plan to recheck inflammatory markers in 2 weeks back in the office in 4 weeks and will ask the rep from the pharmaceutical company to come to the office that day.  Follow-up C-reactive protein and sedimentation rate quickly normalized on 07/18/2020  Component Ref Range & Units 2 wk ago  (07/18/20) 4 mo ago  (03/15/20) 1 yr ago  (03/08/19) 1 yr ago  (08/06/18) 2 yr ago  (07/02/18) 2 yr ago  (05/20/18) 2 yr ago  (05/05/18)  CRP, High Sensitivity 0.00 - 3.00 mg/L 0.70  2.45 CM  7.95High CM  0.50 CM  0.82 CM  0.68 CM  10.71High CM    Component Ref Range & Units 2 wk ago  (07/18/20) 2 mo ago  (05/24/20) 2 mo ago  (05/10/20) 4 mo ago  (03/23/20) 4 mo ago  (03/15/20) 4 mo ago  (03/07/20) 7 mo ago  (12/27/19)  Sed Rate 0 - 30 mm/hr 12  38High  88High  17  55High  29  15     Past Medical History:  Diagnosis Date  . Abnormal cardiac CT angiography   . Acute idiopathic pericarditis 04/25/2016   Overview:  2017: hosp, EKG changes, colchicine/indomethacin  . Acute medial meniscal tear 12/15/2014  . Angina pectoris ([place]) 05/05/2018  . Aortic regurgitation 07/07/2018  . Aortic stenosis, mild 07/07/2018  . Arthritis    LEFT  SHOULDER  . Asthma    pt unsure  . Benign hypertension 11/27/2015  . Bicuspid aortic valve 05/22/2018  . CAD in native artery 11/11/2018  . Complication of anesthesia    " during a knee surgery , my blood pressure was up & stayed up "  . Coronary artery disease   . Coronary artery disease of native artery of native heart with stable angina pectoris ([place]) 11/09/2018  . Enlarged thoracic aorta ([place]) 05/22/2018  . External hemorrhoid, thrombosed 12/28/2015   Overview:  2017:   . Fatty liver   . Heart murmur   . History of adenomatous polyp of colon   . History of kidney stones   . Hyperlipidemia   . Hypertension   . Lower respiratory  infection 02/08/2016  . Mixed hyperlipidemia 11/27/2015  . Obstructive sleep apnea 11/27/2015  . OSA on CPAP    uses cpap  . 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 Right knee meniscal tear   . Screening for diabetes mellitus (DM) 11/27/2015  . Type 2 diabetes mellitus without complication, without long-term current use of insulin ([place]) 11/27/2015   Formatting of this note might be different from the original. 2019: 107/6.4 2020: 5.9 2020: 130/7.7  . Wears glasses     Past Surgical History:  Procedure Laterality Date  . APPENDECTOMY  age 25  . CARDIAC CATHETERIZATION  11/11/2018  . CHONDROPLASTY Right 12/15/2014   Procedure: CHONDROPLASTY;  Surgeon: Gaynelle Arabian, MD;  [facility];  Service: Orthopedics;  Laterality: Right;  . COLONOSCOPY W/ POLYPECTOMY  2011  . CORONARY STENT INTERVENTION  11/11/2018  . CORONARY STENT INTERVENTION N/A 11/11/2018   Procedure: CORONARY STENT INTERVENTION;  Surgeon: Troy Sine, MD;  Location: [place] [facility];  Service: Cardiovascular;  Laterality: N/A;  . CYSTO/  URETEROSCOPIC STONE EXTRACTIONS  1995  . KNEE ARTHROSCOPY WITH LATERAL MENISECTOMY Right 12/15/2014   Procedure: KNEE ARTHROSCOPY WITH LATERAL MENISECTOMY;  Surgeon: Gaynelle Arabian, MD;  Location[facility];  Service: Orthopedics;  Laterality: Right;  . KNEE ARTHROSCOPY WITH MEDIAL MENISECTOMY Right 12/15/2014   Procedure: KNEE ARTHROSCOPY WITH MEDIAL MENISECTOMY;  Surgeon: Gaynelle Arabian, MD;  Location: [facility];  Service: Orthopedics;  Laterality: Right;  . LEFT HEART CATH AND CORONARY ANGIOGRAPHY N/A 11/11/2018   Procedure: LEFT HEART CATH AND CORONARY ANGIOGRAPHY;  Surgeon: Troy Sine, MD;  Location: [place] [facility];  Service: Cardiovascular;  Laterality: N/A;  . NASAL SEPTUM SURGERY  1995  . TOTAL KNEE ARTHROPLASTY Right     Current Medications: Current Meds  Medication Sig  . acetaminophen (TYLENOL) 500 MG tablet Take  500 mg by mouth daily as needed (before golfing.).  Marland Kitchen ARCALYST 220 MG SOLR Inject 1 each into the skin every 7 (seven) days.  Marland Kitchen b complex vitamins tablet Take 1 tablet by mouth daily.  . cholecalciferol (VITAMIN D3) 25 MCG (1000 UT) tablet Take 1,000 Units by mouth daily.  . clindamycin (CLEOCIN) 300 MG capsule Take 2 capsules (600 mg total) by mouth as directed. Take 2 capsules (6109m) 45 minutes prior to dental visits  . clopidogrel (PLAVIX) 75 MG tablet Take 1 tablet (75 mg total) by mouth daily.  . Colchicine (MITIGARE) 0.6 MG CAPS Take 0.6 mg by mouth daily.  .Marland KitchenEPINEPHrine 0.3 mg/0.3 mL IJ SOAJ injection Use as directed for life-threatening allergic reaction. (Patient taking differently: Inject 0.3 mg into the muscle as needed for anaphylaxis (meat allergy (alpha gal)).)  . glucose monitoring  kit (FREESTYLE) monitoring kit 1 each by Does not apply route as needed for other.  . ibuprofen (ADVIL) 200 MG tablet Take 400 mg by mouth in the morning and at bedtime.  Marland Kitchen JARDIANCE 25 MG TABS tablet Take 25 mg by mouth daily.  . nitroGLYCERIN (NITROSTAT) 0.4 MG SL tablet Place 1 tablet (0.4 mg total) under the tongue every 5 (five) minutes as needed for chest pain.  . predniSONE (DELTASONE) 20 MG tablet Take 1 tablet (20 mg total) by mouth daily with breakfast. (Patient taking differently: Take 20 mg by mouth daily with breakfast. Take 0.5 tablet ( 10 mg ) daily)  . rosuvastatin (CRESTOR) 20 MG tablet Take 20 mg by mouth daily.  Marland Kitchen telmisartan (MICARDIS) 20 MG tablet Take 0.5 tablets (10 mg total) by mouth daily.     Allergies:   Other, Penicillins, and Hydrocodeine [dihydrocodeine]   Social History   Socioeconomic History  . Marital status: Married    Spouse name: Not on file  . Number of children: Not on file  . Years of education: Not on file  . Highest education level: Not on file  Occupational History  . Not on file  Tobacco Use  . Smoking status: Never Smoker  . Smokeless tobacco:  Never Used  Vaping Use  . Vaping Use: Never used  Substance and Sexual Activity  . Alcohol use: No  . Drug use: No  . Sexual activity: Not on file  Other Topics Concern  . Not on file  Social History Narrative  . Not on file   Social Determinants of Health   Financial Resource Strain: Not on file  Food Insecurity: Not on file  Transportation Needs: Not on file  Physical Activity: Not on file  Stress: Not on file  Social Connections: Not on file     Family History: The patient's family history includes Pancreatic cancer in his father; Stomach cancer in his mother. ROS:   Please see the history of present illness.    All other systems reviewed and are negative.  EKGs/Labs/Other Studies Reviewed:    The following studies were reviewed today:   Recent Labs: 05/24/2020: ALT 25 06/27/2020: BUN 21; Creatinine, Ser 1.11; Potassium 3.6; Sodium 141 07/18/2020: Hemoglobin 17.3; Platelets 237  Recent Lipid Panel    Component Value Date/Time   CHOL 131 05/24/2020 0900   TRIG 192 (H) 05/24/2020 0900   HDL 61 05/24/2020 0900   CHOLHDL 2.1 05/24/2020 0900   LDLCALC 40 05/24/2020 0900    Physical Exam:    VS:  BP 137/88   Pulse 78   Ht 5' 11"  (1.803 m)   Wt 193 lb (87.5 kg)   SpO2 93%   BMI 26.92 kg/m     Wt Readings from Last 3 Encounters:  08/03/20 193 lb (87.5 kg)  07/11/20 188 lb (85.3 kg)  06/27/20 188 lb 3.2 oz (85.4 kg)     GEN:  Well nourished, well developed in no acute distress HEENT: Normal NECK: No JVD; No carotid bruits LYMPHATICS: No lymphadenopathy CARDIAC: He has no pericardial rub RRR, no murmurs, rubs, gallops RESPIRATORY:  Clear to auscultation without rales, wheezing or rhonchi  ABDOMEN: Soft, non-tender, non-distended MUSCULOSKELETAL:  No edema; No deformity  SKIN: Warm and dry NEUROLOGIC:  Alert and oriented x 3 PSYCHIATRIC:  Normal affect    Signed, Shirlee More, MD  08/03/2020 4:20 PM    [facility]

## 2020-08-12 LAB — CBC
Hematocrit: 48.2 % (ref 37.5–51.0)
Hemoglobin: 16.6 g/dL (ref 13.0–17.7)
MCH: 30.5 pg (ref 26.6–33.0)
MCHC: 34.4 g/dL (ref 31.5–35.7)
MCV: 88 fL (ref 79–97)
Platelets: 240 10*3/uL (ref 150–450)
RBC: 5.45 x10E6/uL (ref 4.14–5.80)
RDW: 14.3 % (ref 11.6–15.4)
WBC: 6.6 10*3/uL (ref 3.4–10.8)

## 2020-08-12 LAB — HIGH SENSITIVITY CRP: CRP, High Sensitivity: 0.29 mg/L (ref 0.00–3.00)

## 2020-08-12 LAB — SEDIMENTATION RATE: Sed Rate: 7 mm/hr (ref 0–30)

## 2020-08-29 ENCOUNTER — Telehealth: Payer: Self-pay

## 2020-08-29 NOTE — Telephone Encounter (Signed)
   Primary Cardiologist: [name], MD  Chart reviewed as part of pre-operative protocol coverage. Because of Eugene Taylor's past medical history and time since last visit, he will require a follow-up visit in order to better assess preoperative cardiovascular risk.  Pre-op covering staff: -The patient already has an upcoming appointment with Dr. Dulce Sellar within acceptable timeframe, please add "pre-op clearance" to the appointment notes so provider is aware. -Please contact requesting surgeon's office via preferred method (i.e, phone, fax) to inform them of need for appointment prior to surgery.  If applicable, this message will also be routed to pharmacy pool and/or primary cardiologist for input on holding anticoagulant/antiplatelet agent as requested below so that this information is available to the clearing provider at time of patient's appointment.   Georgie Chard, NP  08/29/2020, 5:10 PM

## 2020-08-29 NOTE — Telephone Encounter (Signed)
   [facility] Pre-operative Risk Assessment    HEARTCARE STAFF: - Please ensure there is not already an duplicate clearance open for this procedure. - Under Visit Info/Reason for Call, type in Other and utilize the format Clearance MM/DD/YY or Clearance TBD. Do not use dashes or single digits. - If request is for dental extraction, please clarify the # of teeth to be extracted.  Request for surgical clearance:  1. What type of surgery is being performed? Left Reverse Total Shoulder Surgery   2. When is this surgery scheduled? TBD   3. What type of clearance is required (medical clearance vs. Pharmacy clearance to hold med vs. Both)? Both  4. Are there any medications that need to be held prior to surgery and how long? None noted   5. Practice name and name of physician performing surgery? [facility] Dr. Esmond Plants   6. What is the office phone number? [phone]   7.   What is the office fax number? [phone]  8.   Anesthesia type (None, local, MAC, general) ? [place] 08/29/2020, 4:55 PM  _________________________________________________________________   (provider comments below)

## 2020-08-29 NOTE — Telephone Encounter (Signed)
appt notes have been update to reflect pre op clearance needed. Will forward notes to MD for upcoming appt.

## 2020-09-11 NOTE — Progress Notes (Deleted)
{Choose 1 Note Type (Video or Telephone):[phone]}    Date:  09/11/2020   ID:  Eugene Taylor, DOB 04/15/1951, MRN [id number] The patient was identified using 2 identifiers.  {Patient Location:[phone]::"Home"} {Provider [phone]::"Home Office"}  PCP:  Olive Bass, MD  Cardiologist:  [name], MD *** Electrophysiologist:  None   Evaluation Performed:  {Choose Visit Type:[phone]::"Follow-Up Visit"}  Chief Complaint:  ***  History of Present Illness:    Eugene Taylor is a 70 y.o. male with recurrent idiopathic pericarditis failing steroid therapy initiated on rilonacept 07/11/2020 and fortunately is now off steroid therapy.  He has had a very good response with quick normalization of his inflammatory marker CRP and sedimentation rate. Other cardiac problems include CAD with PCI and stent proximal left circumflex 11/11/2018, mild aortic stenosis and regurgitation, mild enlargement ascending aorta, hypertensive heart disease and hyper lipidemia. The patient {does/does not:200015} have symptoms concerning for COVID-19 infection (fever, chills, cough, or new shortness of breath).    Past Medical History:  Diagnosis Date  . Abnormal cardiac CT angiography   . Acute idiopathic pericarditis 04/25/2016   Overview:  2017: hosp, EKG changes, colchicine/indomethacin  . Acute medial meniscal tear 12/15/2014  . Angina pectoris (HCC) 05/05/2018  . Aortic regurgitation 07/07/2018  . Aortic stenosis, mild 07/07/2018  . Arthritis    LEFT SHOULDER  . Asthma    pt unsure  . Benign hypertension 11/27/2015  . Bicuspid aortic valve 05/22/2018  . CAD in native artery 11/11/2018  . Complication of anesthesia    " during a knee surgery , my blood pressure was up & stayed up "  . Coronary artery disease   . Coronary artery disease of native artery of native heart with stable angina pectoris (HCC) 11/09/2018  . Enlarged thoracic aorta (HCC) 05/22/2018  . External hemorrhoid,  thrombosed 12/28/2015   Overview:  2017:   . Fatty liver   . Heart murmur   . History of adenomatous polyp of colon   . History of kidney stones   . Hyperlipidemia   . Hypertension   . Lower respiratory infection 02/08/2016  . Mixed hyperlipidemia 11/27/2015  . Obstructive sleep apnea 11/27/2015  . OSA on CPAP    uses cpap  . 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 Right knee meniscal tear   . Screening for diabetes mellitus (DM) 11/27/2015  . Type 2 diabetes mellitus without complication, without long-term current use of insulin (HCC) 11/27/2015   Formatting of this note might be different from the original. 2019: 107/6.4 2020: 5.9 2020: 130/7.7  . Wears glasses    Past Surgical History:  Procedure Laterality Date  . APPENDECTOMY  age 44  . CARDIAC CATHETERIZATION  11/11/2018  . CHONDROPLASTY Right 12/15/2014   Procedure: CHONDROPLASTY;  Surgeon: Ollen Gross, MD;  Location: [facility];  Service: Orthopedics;  Laterality: Right;  . COLONOSCOPY W/ POLYPECTOMY  2011  . CORONARY STENT INTERVENTION  11/11/2018  . CORONARY STENT INTERVENTION N/A 11/11/2018   Procedure: CORONARY STENT INTERVENTION;  Surgeon: Lennette Bihari, MD;  Location: [facility] INVASIVE CV LAB;  Service: Cardiovascular;  Laterality: N/A;  . CYSTO/  URETEROSCOPIC STONE EXTRACTIONS  1995  . KNEE ARTHROSCOPY WITH LATERAL MENISECTOMY Right 12/15/2014   Procedure: KNEE ARTHROSCOPY WITH LATERAL MENISECTOMY;  Surgeon: Ollen Gross, MD;  Location: [facility];  Service: Orthopedics;  Laterality: Right;  . KNEE ARTHROSCOPY WITH MEDIAL MENISECTOMY Right 12/15/2014   Procedure: KNEE ARTHROSCOPY WITH MEDIAL MENISECTOMY;  Surgeon: Ollen Gross, MD;  Location:  SURGERY CENTER;  Service: Orthopedics;  Laterality: Right;  . LEFT HEART CATH AND CORONARY ANGIOGRAPHY N/A 11/11/2018   Procedure: LEFT HEART CATH AND CORONARY ANGIOGRAPHY;  Surgeon: Lennette Bihari, MD;  Location: [facility] INVASIVE CV LAB;   Service: Cardiovascular;  Laterality: N/A;  . NASAL SEPTUM SURGERY  1995  . TOTAL KNEE ARTHROPLASTY Right      No outpatient medications have been marked as taking for the 09/12/20 encounter (Appointment) with Baldo Daub, MD.     Allergies:   Other, Penicillins, and Hydrocodeine [dihydrocodeine]   Social History   Tobacco Use  . Smoking status: Never Smoker  . Smokeless tobacco: Never Used  Vaping Use  . Vaping Use: Never used  Substance Use Topics  . Alcohol use: No  . Drug use: No     Family Hx: The patient's family history includes Pancreatic cancer in his father; Stomach cancer in his mother.  ROS:   Please see the history of present illness.    *** All other systems reviewed and are negative.   Prior CV studies:   The following studies were reviewed today:  ***  Labs/Other Tests and Data Reviewed:    EKG:  {EKG/Telemetry Strips Reviewed:[id number]}  Recent Labs: 05/24/2020: ALT 25 06/27/2020: BUN 21; Creatinine, Ser 1.11; Potassium 3.6; Sodium 141 08/11/2020: Hemoglobin 16.6; Platelets 240   Recent Lipid Panel Lab Results  Component Value Date/Time   CHOL 131 05/24/2020 09:00 AM   TRIG 192 (H) 05/24/2020 09:00 AM   HDL 61 05/24/2020 09:00 AM   CHOLHDL 2.1 05/24/2020 09:00 AM   LDLCALC 40 05/24/2020 09:00 AM    Wt Readings from Last 3 Encounters:  08/03/20 193 lb (87.5 kg)  07/11/20 188 lb (85.3 kg)  06/27/20 188 lb 3.2 oz (85.4 kg)     Risk Assessment/Calculations:   {Does this patient have ATRIAL FIBRILLATION?:[phone]}  Objective:    Vital Signs:  There were no vitals taken for this visit.   {HeartCare Virtual Exam (Optional):[phone]::"VITAL SIGNS:  reviewed"}  ASSESSMENT & PLAN:    1. ***   {Are you ordering a CV Procedure (e.g. stress test, cath, DCCV, TEE, etc)?   Press F2        :[id number]}    COVID-19 Education: The signs and symptoms of COVID-19 were discussed with the patient and how to seek care for testing (follow  up with PCP or arrange E-visit).  ***The importance of social distancing was discussed today.  Time:   Today, I have spent *** minutes with the patient with telehealth technology discussing the above problems.     Medication Adjustments/Labs and Tests Ordered: Current medicines are reviewed at length with the patient today.  Concerns regarding medicines are outlined above.   Tests Ordered: No orders of the defined types were placed in this encounter.   Medication Changes: No orders of the defined types were placed in this encounter.   Follow Up:  {F/U Format:[phone]} {follow up:15908}  Signed, [name], MD  09/11/2020 10:58 AM    [facility]

## 2020-09-12 ENCOUNTER — Telehealth (INDEPENDENT_AMBULATORY_CARE_PROVIDER_SITE_OTHER): Payer: Medicare PPO | Admitting: Cardiology

## 2020-09-12 ENCOUNTER — Telehealth: Payer: Medicare PPO | Admitting: Cardiology

## 2020-09-12 ENCOUNTER — Encounter: Payer: Self-pay | Admitting: Cardiology

## 2020-09-12 VITALS — BP 146/84 | HR 82 | Ht 71.0 in | Wt 190.0 lb

## 2020-09-12 DIAGNOSIS — I25118 Atherosclerotic heart disease of native coronary artery with other forms of angina pectoris: Secondary | ICD-10-CM | POA: Diagnosis not present

## 2020-09-12 DIAGNOSIS — I300 Acute nonspecific idiopathic pericarditis: Secondary | ICD-10-CM | POA: Diagnosis not present

## 2020-09-12 DIAGNOSIS — E782 Mixed hyperlipidemia: Secondary | ICD-10-CM | POA: Diagnosis not present

## 2020-09-12 DIAGNOSIS — I10 Essential (primary) hypertension: Secondary | ICD-10-CM

## 2020-09-12 NOTE — Patient Instructions (Signed)
Medication Instructions: Your physician recommends that you continue on your current medications as directed. Please refer to the Current Medication list given to you today.  *If you need a refill on your cardiac medications before your next appointment, please call your pharmacy*   Lab Work: Your physician recommends that you return for lab work in: TODAY CBC, CMP, CRP, Sed rate If you have labs (blood work) drawn today and your tests are completely normal, you will receive your results only by: Marland Kitchen MyChart Message (if you have MyChart) OR . A paper copy in the mail If you have any lab test that is abnormal or we need to change your treatment, we will call you to review the results.   Testing/Procedures: None   Follow-Up: At [facility], you and your health needs are our priority.  As part of our continuing mission to provide you with exceptional heart care, we have created designated Provider Care Teams.  These Care Teams include your primary Cardiologist (physician) and Advanced Practice Providers (APPs -  Physician Assistants and Nurse Practitioners) who all work together to provide you with the care you need, when you need it.  We recommend signing up for the patient portal called "MyChart".  Sign up information is provided on this After Visit Summary.  MyChart is used to connect with patients for Virtual Visits (Telemedicine).  Patients are able to view lab/test results, encounter notes, upcoming appointments, etc.  Non-urgent messages can be sent to your provider as well.   To learn more about what you can do with MyChart, go to [email or web address].    Your next appointment:   6 week(s)  The format for your next appointment:   In Person  Provider:   [name], MD   Other Instructions

## 2020-09-12 NOTE — Progress Notes (Signed)
Virtual Visit via Video Note   This visit type was conducted due to national recommendations for restrictions regarding the COVID-19 Pandemic (e.g. social distancing) in an effort to limit this patient's exposure and mitigate transmission in our community.  Due to his co-morbid illnesses, this patient is at least at moderate risk for complications without adequate follow up.  This format is felt to be most appropriate for this patient at this time.  All issues noted in this document were discussed and addressed.  A limited physical exam was performed with this format.  Please refer to the patient's chart for his consent to telehealth for [facility].       Date:  09/12/2020   ID:  Eugene Taylor, DOB [birth year], MRN [id number] The patient was identified using 2 identifiers.  Patient Location: Home Provider Location: Office/Clinic  PCP:  Algis Greenhouse, MD  Cardiologist:  Shirlee More, MD  Electrophysiologist:  None   Evaluation Performed:  Follow-Up Visit  Chief Complaint: Recurrent pericarditis with Arcalyst treatment  History of Present Illness:    Eugene Taylor is a 70 y.o. male with recurrent idiopathic pericarditis failing steroid therapy initiated on rilonacept 07/11/2020 and fortunately he  is now off steroid therapy.  He has had a very good response with quick normalization of his inflammatory marker CRP and sedimentation rate. Other cardiac problems include CAD with PCI and stent proximal left circumflex 11/11/2018, mild aortic stenosis and regurgitation, mild enlargement ascending aorta, hypertensive heart disease and hyper lipidemia.  The patient does not have symptoms concerning for COVID-19 infection (fever, chills, cough, or new shortness of breath).   Recent labs from [facility] PCP: 07/24/2020 A1c 6.3%  His inflammatory markers have quickly stabilized and have been normal:  Component Ref Range & Units 1 mo ago  (08/11/20) 1 mo ago  (07/18/20) 6 mo  ago  (03/15/20) 1 yr ago  (03/08/19) 2 yr ago  (08/06/18) 2 yr ago  (07/02/18) 2 yr ago  (05/20/18)  CRP, High Sensitivity 0.00 - 3.00 mg/L 0.29  0.70 CM  2.45 CM  7.95High CM  0.50 CM  0.82 CM  0.68    Component Ref Range & Units 1 mo ago  (08/11/20) 1 mo ago  (07/18/20) 3 mo ago  (05/24/20) 4 mo ago  (05/10/20) 5 mo ago  (03/23/20) 6 mo ago  (03/15/20) 6 mo ago  (03/07/20)  Sed Rate 0 - 30 mm/hr 7  12  38High  88High  17  55High  29    He has been doing well his only complaint is disease, off steroids he has more joint pain particularly the shoulder which is pending total shoulder arthroplasty.  We would like to delay with his immune therapy.  No angina dyspnea palpitations syncope fever or chill and no pleuritic discomfort.  He will come to the office we will draw CBC recheck his inflammatory markers.  I will see him in the office in 6 weeks and we anticipate 1 year treatment of Arcalyst. Past Medical History:  Diagnosis Date  . Abnormal cardiac CT angiography   . Acute idiopathic pericarditis 04/25/2016   Overview:  2017: hosp, EKG changes, colchicine/indomethacin  . Acute medial meniscal tear 12/15/2014  . Angina pectoris ([place]) 05/05/2018  . Aortic regurgitation 07/07/2018  . Aortic stenosis, mild 07/07/2018  . Arthritis    LEFT SHOULDER  . Asthma    pt unsure  . Benign hypertension 11/27/2015  . Bicuspid aortic valve 05/22/2018  .  CAD in native artery 11/11/2018  . Complication of anesthesia    " during a knee surgery , my blood pressure was up & stayed up "  . Coronary artery disease   . Coronary artery disease of native artery of native heart with stable angina pectoris ([place]) 11/09/2018  . Enlarged thoracic aorta ([place]) 05/22/2018  . External hemorrhoid, thrombosed 12/28/2015   Overview:  2017:   . Fatty liver   . Heart murmur   . History of adenomatous polyp of colon   . History of kidney stones   . Hyperlipidemia   . Hypertension   . Lower respiratory infection 02/08/2016   . Mixed hyperlipidemia 11/27/2015  . Obstructive sleep apnea 11/27/2015  . OSA on CPAP    uses cpap  . 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 Right knee meniscal tear   . Screening for diabetes mellitus (DM) 11/27/2015  . Type 2 diabetes mellitus without complication, without long-term current use of insulin ([place]) 11/27/2015   Formatting of this note might be different from the original. 2019: 107/6.4 2020: 5.9 2020: 130/7.7  . Wears glasses    Past Surgical History:  Procedure Laterality Date  . APPENDECTOMY  age 61  . CARDIAC CATHETERIZATION  11/11/2018  . CHONDROPLASTY Right 12/15/2014   Procedure: CHONDROPLASTY;  Surgeon: Gaynelle Arabian, MD;  Location: [facility];  Service: Orthopedics;  Laterality: Right;  . COLONOSCOPY W/ POLYPECTOMY  2011  . CORONARY STENT INTERVENTION  11/11/2018  . CORONARY STENT INTERVENTION N/A 11/11/2018   Procedure: CORONARY STENT INTERVENTION;  Surgeon: Troy Sine, MD;  Location: [place] CV LAB;  Service: Cardiovascular;  Laterality: N/A;  . CYSTO/  URETEROSCOPIC STONE EXTRACTIONS  1995  . KNEE ARTHROSCOPY WITH LATERAL MENISECTOMY Right 12/15/2014   Procedure: KNEE ARTHROSCOPY WITH LATERAL MENISECTOMY;  Surgeon: Gaynelle Arabian, MD;  Location: [facility];  Service: Orthopedics;  Laterality: Right;  . KNEE ARTHROSCOPY WITH MEDIAL MENISECTOMY Right 12/15/2014   Procedure: KNEE ARTHROSCOPY WITH MEDIAL MENISECTOMY;  Surgeon: Gaynelle Arabian, MD;  Location: [facility];  Service: Orthopedics;  Laterality: Right;  . LEFT HEART CATH AND CORONARY ANGIOGRAPHY N/A 11/11/2018   Procedure: LEFT HEART CATH AND CORONARY ANGIOGRAPHY;  Surgeon: Troy Sine, MD;  Location: [place] CV LAB;  Service: Cardiovascular;  Laterality: N/A;  . NASAL SEPTUM SURGERY  1995  . TOTAL KNEE ARTHROPLASTY Right      Current Meds  Medication Sig  . acetaminophen (TYLENOL) 500 MG tablet Take 500 mg by mouth daily as needed (before  golfing.).  Marland Kitchen ARCALYST 220 MG SOLR Inject 1 each into the skin every 7 (seven) days.  Marland Kitchen b complex vitamins tablet Take 1 tablet by mouth daily.  . cholecalciferol (VITAMIN D3) 25 MCG (1000 UT) tablet Take 1,000 Units by mouth daily.  . clindamycin (CLEOCIN) 300 MG capsule Take 2 capsules (600 mg total) by mouth as directed. Take 2 capsules (645m) 45 minutes prior to dental visits  . clopidogrel (PLAVIX) 75 MG tablet Take 1 tablet (75 mg total) by mouth daily.  . Colchicine (MITIGARE) 0.6 MG CAPS Take 0.6 mg by mouth daily.  .Marland KitchenEPINEPHrine 0.3 mg/0.3 mL IJ SOAJ injection Use as directed for life-threatening allergic reaction. (Patient taking differently: Inject 0.3 mg into the muscle as needed for anaphylaxis (meat allergy (alpha gal)).)  . glucose monitoring kit (FREESTYLE) monitoring kit 1 each by Does not apply route as needed for other.  . ibuprofen (ADVIL) 200 MG tablet Take 400  mg by mouth in the morning and at bedtime.  Marland Kitchen JARDIANCE 25 MG TABS tablet Take 25 mg by mouth daily.  . nitroGLYCERIN (NITROSTAT) 0.4 MG SL tablet Place 1 tablet (0.4 mg total) under the tongue every 5 (five) minutes as needed for chest pain.  . rosuvastatin (CRESTOR) 20 MG tablet Take 20 mg by mouth daily.  Marland Kitchen telmisartan (MICARDIS) 20 MG tablet Take 0.5 tablets (10 mg total) by mouth daily.     Allergies:   Other, Penicillins, and Hydrocodeine [dihydrocodeine]   Social History   Tobacco Use  . Smoking status: Never Smoker  . Smokeless tobacco: Never Used  Vaping Use  . Vaping Use: Never used  Substance Use Topics  . Alcohol use: No  . Drug use: No     Family Hx: The patient's family history includes Pancreatic cancer in his father; Stomach cancer in his mother.  ROS:   Please see the history of present illness.     All other systems reviewed and are negative.   Prior CV studies:   The following studies were reviewed today:    Labs/Other Tests and Data Reviewed:    EKG:  An ECG dated  03/10/2020 was personally reviewed today and demonstrated:  Sinus rhythm left atrial enlargement otherwise normal no findings of pericarditis were evident.  Recent Labs: 05/24/2020: ALT 25 06/27/2020: BUN 21; Creatinine, Ser 1.11; Potassium 3.6; Sodium 141 08/11/2020: Hemoglobin 16.6; Platelets 240   Recent Lipid Panel Lab Results  Component Value Date/Time   CHOL 131 05/24/2020 09:00 AM   TRIG 192 (H) 05/24/2020 09:00 AM   HDL 61 05/24/2020 09:00 AM   CHOLHDL 2.1 05/24/2020 09:00 AM   LDLCALC 40 05/24/2020 09:00 AM    Wt Readings from Last 3 Encounters:  09/12/20 190 lb (86.2 kg)  08/03/20 193 lb (87.5 kg)  07/11/20 188 lb (85.3 kg)     Risk Assessment/Calculations:      Objective:    Vital Signs:  BP (!) 146/84   Pulse 82   Ht _0  (1.803 m)   Wt 190 lb (86.2 kg)   BMI 26.50 kg/m    VITAL SIGNS:  reviewed GEN:  no acute distress EYES:  sclerae anicteric, EOMI - Extraocular Movements Intact RESPIRATORY:  normal respiratory effort, symmetric expansion CARDIOVASCULAR:  no peripheral edema SKIN:  no rash, lesions or ulcers. MUSCULOSKELETAL:  no obvious deformities. NEURO:  alert and oriented x 3, no obvious focal deficit PSYCH:  normal affect He no longer has around face off steroids ASSESSMENT & PLAN:    1. Recurrent idiopathic pericarditis, doing well stable off steroids continue low-dose colchicine nonsteroidal anti-inflammatory drug and reassess labs including CBC sedimentation rate CRP anticipate 1 year of total treatment.  Today he has had no breakthrough on his current therapy. 2. Stable BP is at target repeat blood pressure by him was 136/80 during my visit his ARB 3. Stable hyperlipidemia continue his high intensity statin 4. Stable CAD continue medical therapy including clopidogrel and his high intensity statin.        COVID-19 Education: The signs and symptoms of COVID-19 were discussed with the patient and how to seek care for testing (follow up with  PCP or arrange E-visit).  The importance of social distancing was discussed today.  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  Time:   Today, I have spent 25 minutes with the patient with telehealth technology  discussing the above problems.     Medication Adjustments/Labs and Tests Ordered: Current medicines are reviewed at length with the patient today.  Concerns regarding medicines are outlined above.   Tests Ordered: Orders Placed This Encounter  Procedures  . Comprehensive metabolic panel  . C-reactive protein  . Sedimentation rate  . CBC    Medication Changes: No orders of the defined types were placed in this encounter.   Follow Up:  In Person in 6 week(s)  Signed, Shirlee More, MD  09/12/2020 10:46 AM    [place]

## 2020-09-19 ENCOUNTER — Telehealth: Payer: Self-pay

## 2020-09-19 LAB — COMPREHENSIVE METABOLIC PANEL
ALT: 20 IU/L (ref 0–44)
AST: 24 IU/L (ref 0–40)
Albumin/Globulin Ratio: 2.1 (ref 1.2–2.2)
Albumin: 4.6 g/dL (ref 3.8–4.8)
Alkaline Phosphatase: 60 IU/L (ref 44–121)
BUN/Creatinine Ratio: 23 (ref 10–24)
BUN: 26 mg/dL (ref 8–27)
Bilirubin Total: 0.6 mg/dL (ref 0.0–1.2)
CO2: 21 mmol/L (ref 20–29)
Calcium: 9.5 mg/dL (ref 8.6–10.2)
Chloride: 104 mmol/L (ref 96–106)
Creatinine, Ser: 1.14 mg/dL (ref 0.76–1.27)
GFR calc Af Amer: 77 mL/min/{1.73_m2} (ref >59)
GFR calc non Af Amer: 66 mL/min/{1.73_m2} (ref >59)
Globulin, Total: 2.2 g/dL (ref 1.5–4.5)
Glucose: 90 mg/dL (ref 65–99)
Potassium: 4.2 mmol/L (ref 3.5–5.2)
Sodium: 141 mmol/L (ref 134–144)
Total Protein: 6.8 g/dL (ref 6.0–8.5)

## 2020-09-19 LAB — CBC
Hematocrit: 49.6 % (ref 37.5–51.0)
Hemoglobin: 17.1 g/dL (ref 13.0–17.7)
MCH: 30.7 pg (ref 26.6–33.0)
MCHC: 34.5 g/dL (ref 31.5–35.7)
MCV: 89 fL (ref 79–97)
Platelets: 212 10*3/uL (ref 150–450)
RBC: 5.57 x10E6/uL (ref 4.14–5.80)
RDW: 13.7 % (ref 11.6–15.4)
WBC: 6.2 10*3/uL (ref 3.4–10.8)

## 2020-09-19 LAB — SEDIMENTATION RATE: Sed Rate: 7 mm/hr (ref 0–30)

## 2020-09-19 LAB — C-REACTIVE PROTEIN: CRP: <1 mg/L (ref 0–10)

## 2020-09-19 NOTE — Telephone Encounter (Signed)
Spoke with patient regarding results and recommendation.  Patient verbalizes understanding and is agreeable to plan of care. Advised patient to call back with any issues or concerns.  

## 2020-09-19 NOTE — Telephone Encounter (Signed)
-----   Message from Baldo Daub, MD sent at 09/19/2020  7:50 AM EST ----- Great result no changes

## 2020-11-03 ENCOUNTER — Other Ambulatory Visit: Payer: Self-pay | Admitting: Cardiology

## 2020-11-04 NOTE — Progress Notes (Signed)
Cardiology Office Note:    Date:  11/06/2020   ID:  Eugene Taylor, DOB Sep 20, 1950, MRN [id number]  PCP:  Algis Greenhouse, MD  Cardiologist:  Shirlee More, MD    Referring MD: Algis Greenhouse, MD    ASSESSMENT:    1. Recurrent idiopathic pericarditis   2. Coronary artery disease of native artery of native heart with stable angina pectoris ([place])   3. Essential hypertension   4. Mixed hyperlipidemia    PLAN:    In order of problems listed above:  1. He will stop his nonsteroidal anti-inflammatory drug and remain on long-term colchicine with his CAD.  Recheck inflammatory markers we will plan to discontinue his immunotherapy 04/09/2021 2. Stable CAD having no angina current medical therapy continue the same 3. Stable increases telmisartan back to 20 mg daily 4. He is on a high intensity statin, continue the same check lipid profile   Next appointment: August   Medication Adjustments/Labs and Tests Ordered: Current medicines are reviewed at length with the patient today.  Concerns regarding medicines are outlined above.  No orders of the defined types were placed in this encounter.  No orders of the defined types were placed in this encounter.   No chief complaint on file.   History of Present Illness:    Eugene Taylor is a 70 y.o. male with a hx of recurrent idiopathic pericarditis failing standard treatment with colchicine and nonsteroidal anti-inflammatory drug and steroids responding to treatment with ralonacept with steroid withdrawal last seen 09/13/2019.Other cardiac problems include CAD with PCI and stent proximal left circumflex 11/11/2018, mild aortic stenosis and regurgitation, mild enlargement ascending aorta, hypertensive heart disease and hyper lipidemia.   Compliance with diet, lifestyle and medications: Yes  He continues to do well no pleuritic chest pain fever shortness of breath palpitation or syncope. We will plan to withdraw ralonacept  04/09/2021   His inflammatory markers quickly normalized and have remained very low, he initiated therapy 07/10/2020 Component Ref Range & Units 1 mo ago  (09/18/20) 5 mo ago  (05/24/20) 5 mo ago  (05/10/20) 7 mo ago  (03/23/20) 8 mo ago  (03/07/20) 10 mo ago  (12/27/19) 11 mo ago  (11/15/19)  CRP 0 - 10 mg/L <1  <1  54High  1  105High  <1  <1    Component Ref Range & Units 1 mo ago  (09/18/20) 2 mo ago  (08/11/20) 3 mo ago  (07/18/20) 5 mo ago  (05/24/20) 5 mo ago  (05/10/20) 7 mo ago  (03/23/20) 7 mo ago  (03/15/20)  Sed Rate 0 - 30 mm/hr 7  7  12   38High  88High  17  55High     Past Medical History:  Diagnosis Date  . Abnormal cardiac CT angiography   . Acute idiopathic pericarditis 04/25/2016   Overview:  2017: hosp, EKG changes, colchicine/indomethacin  . Acute medial meniscal tear 12/15/2014  . Angina pectoris ([place]) 05/05/2018  . Aortic regurgitation 07/07/2018  . Aortic stenosis, mild 07/07/2018  . Arthritis    LEFT SHOULDER  . Asthma    pt unsure  . Benign hypertension 11/27/2015  . Bicuspid aortic valve 05/22/2018  . CAD in native artery 11/11/2018  . Complication of anesthesia    " during a knee surgery , my blood pressure was up & stayed up "  . Coronary artery disease   . Coronary artery disease of native artery of native heart with stable angina pectoris ([place]) 11/09/2018  .  Enlarged thoracic aorta ([place]) 05/22/2018  . External hemorrhoid, thrombosed 12/28/2015   Overview:  2017:   . Fatty liver   . Heart murmur   . History of adenomatous polyp of colon   . History of kidney stones   . Hyperlipidemia   . Hypertension   . Lower respiratory infection 02/08/2016  . Mixed hyperlipidemia 11/27/2015  . Obstructive sleep apnea 11/27/2015  . OSA on CPAP    uses cpap  . 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 Right knee meniscal tear   . Screening for diabetes mellitus (DM) 11/27/2015  . Type 2 diabetes mellitus without complication, without long-term current use of  insulin ([place]) 11/27/2015   Formatting of this note might be different from the original. 2019: 107/6.4 2020: 5.9 2020: 130/7.7  . Wears glasses     Past Surgical History:  Procedure Laterality Date  . APPENDECTOMY  age 70  . CARDIAC CATHETERIZATION  11/11/2018  . CHONDROPLASTY Right 12/15/2014   Procedure: CHONDROPLASTY;  Surgeon: Gaynelle Arabian, MD;  [facility];  Service: Orthopedics;  Laterality: Right;  . COLONOSCOPY W/ POLYPECTOMY  2011  . CORONARY STENT INTERVENTION  11/11/2018  . CORONARY STENT INTERVENTION N/A 11/11/2018   Procedure: CORONARY STENT INTERVENTION;  Surgeon: Troy Sine, MD;  Location: [place] [facility];  Service: Cardiovascular;  Laterality: N/A;  . CYSTO/  URETEROSCOPIC STONE EXTRACTIONS  1995  . KNEE ARTHROSCOPY WITH LATERAL MENISECTOMY Right 12/15/2014   Procedure: KNEE ARTHROSCOPY WITH LATERAL MENISECTOMY;  Surgeon: Gaynelle Arabian, MD;  Location[facility];  Service: Orthopedics;  Laterality: Right;  . KNEE ARTHROSCOPY WITH MEDIAL MENISECTOMY Right 12/15/2014   Procedure: KNEE ARTHROSCOPY WITH MEDIAL MENISECTOMY;  Surgeon: Gaynelle Arabian, MD;  Location: [facility];  Service: Orthopedics;  Laterality: Right;  . LEFT HEART CATH AND CORONARY ANGIOGRAPHY N/A 11/11/2018   Procedure: LEFT HEART CATH AND CORONARY ANGIOGRAPHY;  Surgeon: Troy Sine, MD;  Location: [place] [facility];  Service: Cardiovascular;  Laterality: N/A;  . NASAL SEPTUM SURGERY  1995  . TOTAL KNEE ARTHROPLASTY Right     Current Medications: Current Meds  Medication Sig  . acetaminophen (TYLENOL) 500 MG tablet Take 500 mg by mouth daily as needed (before golfing.).  Marland Kitchen ARCALYST 220 MG SOLR Inject 1 each into the skin every 7 (seven) days.  Marland Kitchen b complex vitamins tablet Take 1 tablet by mouth daily.  . cholecalciferol (VITAMIN D3) 25 MCG (1000 UT) tablet Take 1,000 Units by mouth daily.  . clindamycin (CLEOCIN) 300 MG capsule Take 2 capsules  (600 mg total) by mouth as directed. Take 2 capsules (665m) 45 minutes prior to dental visits  . clopidogrel (PLAVIX) 75 MG tablet TAKE 1 TABLET(75 MG) BY MOUTH DAILY  . Colchicine (MITIGARE) 0.6 MG CAPS Take 0.6 mg by mouth daily.  .Marland KitchenEPINEPHrine 0.3 mg/0.3 mL IJ SOAJ injection Use as directed for life-threatening allergic reaction. (Patient taking differently: Inject 0.3 mg into the muscle as needed for anaphylaxis (meat allergy (alpha gal)).)  . glucose monitoring kit (FREESTYLE) monitoring kit 1 each by Does not apply route as needed for other.  . ibuprofen (ADVIL) 200 MG tablet Take 400 mg by mouth in the morning and at bedtime.  .Marland KitchenJARDIANCE 25 MG TABS tablet Take 25 mg by mouth daily.  . nitroGLYCERIN (NITROSTAT) 0.4 MG SL tablet Place 1 tablet (0.4 mg total) under the tongue every 5 (five) minutes as needed for chest pain.  . rosuvastatin (CRESTOR) 20  MG tablet Take 20 mg by mouth daily.  . [DISCONTINUED] telmisartan (MICARDIS) 20 MG tablet Take 0.5 tablets (10 mg total) by mouth daily.     Allergies:   Other, Penicillins, and Hydrocodeine [dihydrocodeine]   Social History   Socioeconomic History  . Marital status: Married    Spouse name: Not on file  . Number of children: Not on file  . Years of education: Not on file  . Highest education level: Not on file  Occupational History  . Not on file  Tobacco Use  . Smoking status: Never Smoker  . Smokeless tobacco: Never Used  Vaping Use  . Vaping Use: Never used  Substance and Sexual Activity  . Alcohol use: No  . Drug use: No  . Sexual activity: Not on file  Other Topics Concern  . Not on file  Social History Narrative  . Not on file   Social Determinants of Health   Financial Resource Strain: Not on file  Food Insecurity: Not on file  Transportation Needs: Not on file  Physical Activity: Not on file  Stress: Not on file  Social Connections: Not on file     Family History: The patient's family history includes  Pancreatic cancer in his father; Stomach cancer in his mother. ROS:   Please see the history of present illness.    All other systems reviewed and are negative.  EKGs/Labs/Other Studies Reviewed:    The following studies were reviewed today:  EKG:  EKG ordered today and personally reviewed.  The ekg ordered today demonstrates sinus rhythm nonspecific T waves unchanged from November 2021  Recent Labs: 09/18/2020: ALT 20; BUN 26; Creatinine, Ser 1.14; Hemoglobin 17.1; Platelets 212; Potassium 4.2; Sodium 141  Recent Lipid Panel    Component Value Date/Time   CHOL 131 05/24/2020 0900   TRIG 192 (H) 05/24/2020 0900   HDL 61 05/24/2020 0900   CHOLHDL 2.1 05/24/2020 0900   LDLCALC 40 05/24/2020 0900    Physical Exam:    VS:  BP 136/88   Pulse 83   Ht 5' 11"  (1.803 m)   Wt 198 lb (89.8 kg)   SpO2 96%   BMI 27.62 kg/m     Wt Readings from Last 3 Encounters:  11/06/20 198 lb (89.8 kg)  09/12/20 190 lb (86.2 kg)  08/03/20 193 lb (87.5 kg)     GEN:  Well nourished, well developed in no acute distress HEENT: Normal NECK: No JVD; No carotid bruits LYMPHATICS: No lymphadenopathy CARDIAC: RRR, no murmurs, rubs, gallops RESPIRATORY:  Clear to auscultation without rales, wheezing or rhonchi  ABDOMEN: Soft, non-tender, non-distended MUSCULOSKELETAL:  No edema; No deformity  SKIN: Warm and dry NEUROLOGIC:  Alert and oriented x 3 PSYCHIATRIC:  Normal affect    Signed, Shirlee More, MD  11/06/2020 2:07 PM    [facility]

## 2020-11-06 ENCOUNTER — Ambulatory Visit: Payer: Medicare PPO | Admitting: Cardiology

## 2020-11-06 ENCOUNTER — Encounter: Payer: Self-pay | Admitting: Cardiology

## 2020-11-06 ENCOUNTER — Other Ambulatory Visit: Payer: Self-pay

## 2020-11-06 VITALS — BP 136/88 | HR 83 | Ht 71.0 in | Wt 198.0 lb

## 2020-11-06 DIAGNOSIS — I10 Essential (primary) hypertension: Secondary | ICD-10-CM | POA: Diagnosis not present

## 2020-11-06 DIAGNOSIS — I25118 Atherosclerotic heart disease of native coronary artery with other forms of angina pectoris: Secondary | ICD-10-CM | POA: Diagnosis not present

## 2020-11-06 DIAGNOSIS — I300 Acute nonspecific idiopathic pericarditis: Secondary | ICD-10-CM

## 2020-11-06 DIAGNOSIS — E782 Mixed hyperlipidemia: Secondary | ICD-10-CM

## 2020-11-06 MED ORDER — TELMISARTAN 20 MG PO TABS: 20.0000 mg | ORAL_TABLET | Freq: Every day | ORAL | 3 refills | Status: DC

## 2020-11-06 NOTE — Patient Instructions (Signed)
Medication Instructions:  Your physician has recommended you make the following change in your medication:  STOP: Ibuprofen INCREASE: Telmisartan 20 mg take one tablet by mouth daily.  *If you need a refill on your cardiac medications before your next appointment, please call your pharmacy*   Lab Work: Your physician recommends that you return for lab work in: TODAY CMP, Lipids, Tobie Lords If you have labs (blood work) drawn today and your tests are completely normal, you will receive your results only by: Marland Kitchen MyChart Message (if you have MyChart) OR . A paper copy in the mail If you have any lab test that is abnormal or we need to change your treatment, we will call you to review the results.   Testing/Procedures: None   Follow-Up: At [facility], you and your health needs are our priority.  As part of our continuing mission to provide you with exceptional heart care, we have created designated Provider Care Teams.  These Care Teams include your primary Cardiologist (physician) and Advanced Practice Providers (APPs -  Physician Assistants and Nurse Practitioners) who all work together to provide you with the care you need, when you need it.  We recommend signing up for the patient portal called "MyChart".  Sign up information is provided on this After Visit Summary.  MyChart is used to connect with patients for Virtual Visits (Telemedicine).  Patients are able to view lab/test results, encounter notes, upcoming appointments, etc.  Non-urgent messages can be sent to your provider as well.   To learn more about what you can do with MyChart, go to [email or web address].    Your next appointment:   5 month(s)  The format for your next appointment:   In Person  Provider:   [name], MD   Other Instructions

## 2020-11-07 ENCOUNTER — Telehealth: Payer: Self-pay

## 2020-11-07 LAB — COMPREHENSIVE METABOLIC PANEL
ALT: 25 IU/L (ref 0–44)
AST: 28 IU/L (ref 0–40)
Albumin/Globulin Ratio: 2.2 (ref 1.2–2.2)
Albumin: 4.6 g/dL (ref 3.8–4.8)
Alkaline Phosphatase: 67 IU/L (ref 44–121)
BUN/Creatinine Ratio: 18 (ref 10–24)
BUN: 19 mg/dL (ref 8–27)
Bilirubin Total: 0.7 mg/dL (ref 0.0–1.2)
CO2: 20 mmol/L (ref 20–29)
Calcium: 9.4 mg/dL (ref 8.6–10.2)
Chloride: 105 mmol/L (ref 96–106)
Creatinine, Ser: 1.03 mg/dL (ref 0.76–1.27)
Globulin, Total: 2.1 g/dL (ref 1.5–4.5)
Glucose: 95 mg/dL (ref 65–99)
Potassium: 4.4 mmol/L (ref 3.5–5.2)
Sodium: 142 mmol/L (ref 134–144)
Total Protein: 6.7 g/dL (ref 6.0–8.5)
eGFR: 80 mL/min/{1.73_m2} (ref >59)

## 2020-11-07 LAB — LIPID PANEL
Chol/HDL Ratio: 2.7 ratio (ref 0.0–5.0)
Cholesterol, Total: 131 mg/dL (ref 100–199)
HDL: 48 mg/dL (ref >39)
LDL Chol Calc (NIH): 60 mg/dL (ref 0–99)
Triglycerides: 130 mg/dL (ref 0–149)
VLDL Cholesterol Cal: 23 mg/dL (ref 5–40)

## 2020-11-07 LAB — SEDIMENTATION RATE: Sed Rate: 6 mm/hr (ref 0–30)

## 2020-11-07 NOTE — Telephone Encounter (Signed)
Left message on patients voicemail to please return our call.   

## 2020-11-07 NOTE — Telephone Encounter (Signed)
-----   Message from Brian J Munley, MD sent at 11/07/2020  7:38 AM EDT ----- All remain good no changes 

## 2020-11-07 NOTE — Progress Notes (Signed)
Sent message, via epic in basket, requesting orders in epic from surgeon.  

## 2020-11-08 ENCOUNTER — Telehealth: Payer: Self-pay

## 2020-11-08 NOTE — Telephone Encounter (Signed)
Spoke with patient regarding results and recommendation.  Patient verbalizes understanding and is agreeable to plan of care. Advised patient to call back with any issues or concerns.  

## 2020-11-08 NOTE — Telephone Encounter (Signed)
-----   Message from Baldo Daub, MD sent at 11/07/2020  7:38 AM EDT ----- All remain good no changes

## 2020-11-15 NOTE — H&P (Signed)
Patient's anticipated LOS is less than 2 midnights, meeting these requirements: - Younger than 34 - Lives within 1 hour of care - Has a competent adult at home to recover with post-op recover - NO history of  - Chronic pain requiring opiods  - Diabetes  - Coronary Artery Disease  - Heart failure  - Heart attack  - Stroke  - DVT/VTE  - Cardiac arrhythmia  - Respiratory Failure/COPD  - Renal failure  - Anemia  - Advanced Liver disease       Eugene Taylor is an 70 y.o. male.    Chief Complaint: left shoulder pain  HPI: Pt is a 70 y.o. male complaining of left shoulder pain for multiple years. Pain had continually increased since the beginning. X-rays in the clinic show end-stage arthritic changes of the left shoulder. Pt has tried various conservative treatments which have failed to alleviate their symptoms, including injections and therapy. Various options are discussed with the patient. Risks, benefits and expectations were discussed with the patient. Patient understand the risks, benefits and expectations and wishes to proceed with surgery.   PCP:  Algis Greenhouse, MD  D/C Plans: Home  PMH: Past Medical History:  Diagnosis Date  . Abnormal cardiac CT angiography   . Acute idiopathic pericarditis 04/25/2016   Overview:  2017: hosp, EKG changes, colchicine/indomethacin  . Acute medial meniscal tear 12/15/2014  . Angina pectoris ([place]) 05/05/2018  . Aortic regurgitation 07/07/2018  . Aortic stenosis, mild 07/07/2018  . Arthritis    LEFT SHOULDER  . Asthma    pt unsure  . Benign hypertension 11/27/2015  . Bicuspid aortic valve 05/22/2018  . CAD in native artery 11/11/2018  . Complication of anesthesia    " during a knee surgery , my blood pressure was up & stayed up "  . Coronary artery disease   . Coronary artery disease of native artery of native heart with stable angina pectoris ([place]) 11/09/2018  . Enlarged thoracic aorta ([place]) 05/22/2018  . External hemorrhoid,  thrombosed 12/28/2015   Overview:  2017:   . Fatty liver   . Heart murmur   . History of adenomatous polyp of colon   . History of kidney stones   . Hyperlipidemia   . Hypertension   . Lower respiratory infection 02/08/2016  . Mixed hyperlipidemia 11/27/2015  . Obstructive sleep apnea 11/27/2015  . OSA on CPAP    uses cpap  . 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 Right knee meniscal tear   . Screening for diabetes mellitus (DM) 11/27/2015  . Type 2 diabetes mellitus without complication, without long-term current use of insulin ([place]) 11/27/2015   Formatting of this note might be different from the original. 2019: 107/6.4 2020: 5.9 2020: 130/7.7  . Wears glasses     PSH: Past Surgical History:  Procedure Laterality Date  . APPENDECTOMY  age 61  . CARDIAC CATHETERIZATION  11/11/2018  . CHONDROPLASTY Right 12/15/2014   Procedure: CHONDROPLASTY;  Surgeon: Gaynelle Arabian, MD;  [facility];  Service: Orthopedics;  Laterality: Right;  . COLONOSCOPY W/ POLYPECTOMY  2011  . CORONARY STENT INTERVENTION  11/11/2018  . CORONARY STENT INTERVENTION N/A 11/11/2018   Procedure: CORONARY STENT INTERVENTION;  Surgeon: Troy Sine, MD;  Location: [place] [facility];  Service: Cardiovascular;  Laterality: N/A;  . CYSTO/  URETEROSCOPIC STONE EXTRACTIONS  1995  . KNEE ARTHROSCOPY WITH LATERAL MENISECTOMY Right 12/15/2014   Procedure: KNEE ARTHROSCOPY WITH LATERAL MENISECTOMY;  Surgeon: Gaynelle Arabian,  MD;  Location: [place];  Service: Orthopedics;  Laterality: Right;  . KNEE ARTHROSCOPY WITH MEDIAL MENISECTOMY Right 12/15/2014   Procedure: KNEE ARTHROSCOPY WITH MEDIAL MENISECTOMY;  Surgeon: Gaynelle Arabian, MD;  Location: [facility];  Service: Orthopedics;  Laterality: Right;  . LEFT HEART CATH AND CORONARY ANGIOGRAPHY N/A 11/11/2018   Procedure: LEFT HEART CATH AND CORONARY ANGIOGRAPHY;  Surgeon: Troy Sine, MD;  Location: [place] [facility];   Service: Cardiovascular;  Laterality: N/A;  . NASAL SEPTUM SURGERY  1995  . TOTAL KNEE ARTHROPLASTY Right     Social History:  reports that he has never smoked. He has never used smokeless tobacco. He reports that he does not drink alcohol and does not use drugs.  Allergies:  Allergies  Allergen Reactions  . Other Anaphylaxis    Red Meat  . Penicillins Other (See Comments)    Did it involve swelling of the face/tongue/throat, SOB, or low BP? Unknown Did it involve sudden or severe rash/hives, skin peeling, or any reaction on the inside of your mouth or nose? Unknown Did you need to seek medical attention at a hospital or doctor's office? Unknown When did it last happen?Childhood allergy If all above answers are "NO", may proceed with cephalosporin use.   Marland Kitchen Hydrocodeine [Dihydrocodeine] Itching    Medications: No current facility-administered medications for this encounter.   Current Outpatient Medications  Medication Sig Dispense Refill  . acetaminophen (TYLENOL) 500 MG tablet Take 500 mg by mouth daily as needed (before golfing.).    Marland Kitchen ARCALYST 220 MG SOLR Inject 1 each into the skin every 7 (seven) days. 4 each 3  . b complex vitamins tablet Take 1 tablet by mouth daily.    . cholecalciferol (VITAMIN D3) 25 MCG (1000 UT) tablet Take 1,000 Units by mouth daily.    . clindamycin (CLEOCIN) 300 MG capsule Take 2 capsules (600 mg total) by mouth as directed. Take 2 capsules (638m) 45 minutes prior to dental visits 10 capsule 3  . clopidogrel (PLAVIX) 75 MG tablet TAKE 1 TABLET(75 MG) BY MOUTH DAILY (Patient taking differently: Take 75 mg by mouth daily.) 90 tablet 3  . Colchicine (MITIGARE) 0.6 MG CAPS Take 0.6 mg by mouth daily. 180 capsule 3  . EPINEPHrine 0.3 mg/0.3 mL IJ SOAJ injection Use as directed for life-threatening allergic reaction. (Patient taking differently: Inject 0.3 mg into the muscle as needed for anaphylaxis (meat allergy (alpha gal)).) 1 each 3  .  JARDIANCE 25 MG TABS tablet Take 25 mg by mouth daily.    . nitroGLYCERIN (NITROSTAT) 0.4 MG SL tablet Place 1 tablet (0.4 mg total) under the tongue every 5 (five) minutes as needed for chest pain. 100 tablet 3  . rosuvastatin (CRESTOR) 20 MG tablet Take 20 mg by mouth daily.    .Marland Kitchentelmisartan (MICARDIS) 20 MG tablet Take 1 tablet (20 mg total) by mouth daily. 90 tablet 3  . glucose monitoring kit (FREESTYLE) monitoring kit 1 each by Does not apply route as needed for other. 1 each 0    No results found for this or any previous visit (from the past 48 hour(s)). No results found.  ROS: Pain with rom of the left upper extremity  Physical Exam: Alert and oriented 70y.o. male in no acute distress Cranial nerves 2-12 intact Cervical spine: full rom with no tenderness, nv intact distally Chest: active breath sounds bilaterally, no wheeze rhonchi or rales Heart: regular rate and rhythm, no murmur Abd:  non tender non distended with active bowel sounds Hip is stable with rom  Left shoulder with painful rom with crepitus nv intact distally No rashes or edema distally  Assessment/Plan Assessment: left shoulder cuff arthropathy  Plan:  Patient will undergo a left reverse total shoulder by Dr. Veverly Fells at [place] benefits and expectations were discussed with the patient. Patient understand risks, benefits and expectations and wishes to proceed. Preoperative templating of the joint replacement has been completed, documented, and submitted to the Operating Room personnel in order to optimize intra-operative equipment management.   Merla Riches PA-C, MPAS [facility] is now [employer] [facility] [address], [place], [place], [place] [address] Phone: [phone] [email or web address]  [employer]

## 2020-11-17 NOTE — Patient Instructions (Addendum)
DUE TO COVID-19 ONLY ONE VISITOR IS ALLOWED TO COME WITH YOU AND STAY IN THE WAITING ROOM ONLY DURING PRE OP AND PROCEDURE DAY OF SURGERY. THE 1 VISITOR  MAY VISIT WITH YOU AFTER SURGERY IN YOUR PRIVATE ROOM DURING VISITING HOURS ONLY!  2:30_on 3/30____, THIS TEST MUST BE DONE BEFORE SURGERY,  COVID TESTING SITE [address] [place] [address], IT IS ON THE RIGHT GOING OUT [address] APPROXIMATELY  2 MINUTES PAST [employer] ON THE RIGHT. ONCE YOUR COVID TEST IS COMPLETED,  PLEASE BEGIN THE QUARANTINE INSTRUCTIONS AS OUTLINED IN YOUR HANDOUT.                Eugene Taylor    Your procedure is scheduled on: 11/24/20   Report to [facility] Main  Entrance   Report to admitting at 10:10 AM     Call this number if you have problems the morning of surgery [phone]    BRUSH YOUR TEETH MORNING OF SURGERY AND RINSE YOUR MOUTH OUT, NO CHEWING GUM CANDY OR MINTS.  No food after midnight.    You may have clear liquid until 10:30 AM.     CLEAR LIQUID DIET   Foods Allowed                                                                     Foods Excluded  Coffee and tea, regular and decaf                             liquids that you cannot  Plain Jell-O any favor except red or purple                                           see through such as: Fruit ices (not with fruit pulp)                                     milk, soups, orange juice  Iced Popsicles                                    All solid food Carbonated beverages, regular and diet                                    Cranberry, grape and apple juices Sports drinks like Gatorade Lightly seasoned clear broth or consume(fat free) Sugar, honey syrup    Nothing by mouth after 10:30 AM.     Take these medicines the morning of surgery with A SIP OF WATER: Colchicine. Bring your mask and tubing   How to Manage Your Diabetes Before and After Surgery  Why is it important to control my blood  sugar before and after surgery? . Improving blood sugar levels before and after surgery helps healing and can limit problems. . A way of improving blood sugar  control is eating a healthy diet by: o  Eating less sugar and carbohydrates o  Increasing activity/exercise o  Talking with your doctor about reaching your blood sugar goals . High blood sugars (greater than 180 mg/dL) can raise your risk of infections and slow your recovery, so you will need to focus on controlling your diabetes during the weeks before surgery. . Make sure that the doctor who takes care of your diabetes knows about your planned surgery including the date and location.  How do I manage my blood sugar before surgery? . Check your blood sugar at least 4 times a day, starting 2 days before surgery, to make sure that the level is not too high or low. o Check your blood sugar the morning of your surgery when you wake up and every 2 hours until you get to the Short Stay unit. . If your blood sugar is less than 70 mg/dL, you will need to treat for low blood sugar: o Do not take insulin. o Treat a low blood sugar (less than 70 mg/dL) with  cup of clear juice (cranberry or apple), 4 glucose tablets, OR glucose gel. o Recheck blood sugar in 15 minutes after treatment (to make sure it is greater than 70 mg/dL). If your blood sugar is not greater than 70 mg/dL on recheck, call [phone] for further instructions. . Report your blood sugar to the short stay nurse when you get to Short Stay.  . If you are admitted to the hospital after surgery: o Your blood sugar will be checked by the staff and you will probably be given insulin after surgery (instead of oral diabetes medicines) to make sure you have good blood sugar levels. o The goal for blood sugar control after surgery is 80-180 mg/dL.   WHAT DO I DO ABOUT MY DIABETES MEDICATION?  Do not take your Jardiance the day before surgery and  . do not take oral diabetes medicines  (pills) the morning of surgery.                   You may not have any metal on your body including               piercings  Do not wear jewelry,  lotions, powders or deodorant                        Men may shave face and neck.   Do not bring valuables to the hospital. [facility] IS NOT             RESPONSIBLE   FOR VALUABLES.  Contacts, dentures or bridgework may not be worn into surgery.              [place]- Preparing for Total Shoulder Arthroplasty    Before surgery, you can play an important role. Because skin is not sterile, your skin needs to be as free of germs as possible. You can reduce the number of germs on your skin by using the following products. . Benzoyl Peroxide Gel o Reduces the number of germs present on the skin o Applied twice a day to shoulder area starting two days before surgery    ==================================================================  Please follow these instructions carefully:  BENZOYL PEROXIDE 5% GEL  Please do not use if you have an allergy to benzoyl peroxide.   If your skin becomes reddened/irritated stop using the benzoyl peroxide.  Starting two days before surgery, apply as  follows: 1. Apply benzoyl peroxide in the morning and at night. Apply after taking a shower. If you are not taking a shower clean entire shoulder front, back, and side along with the armpit with a clean wet washcloth.  2. Place a quarter-sized dollop on your shoulder and rub in thoroughly, making sure to cover the front, back, and side of your shoulder, along with the armpit.   2 days before ____ AM   ____ PM              1 day before ____ AM   ____ PM                         3. Do this twice a day for two days.  (Last application is the night before surgery, AFTER using the CHG soap as described below).  4. Do NOT apply benzoyl peroxide gel on the day of surgery.   [place] - Preparing for Surgery Before surgery, you can play an important role.   Because skin is not sterile, your skin needs to be as free of germs as possible.  You can reduce the number of germs on your skin by washing with CHG (chlorahexidine gluconate) soap before surgery.  CHG is an antiseptic cleaner which kills germs and bonds with the skin to continue killing germs even after washing. Please DO NOT use if you have an allergy to CHG or antibacterial soaps.  If your skin becomes reddened/irritated stop using the CHG and inform your nurse when you arrive at Short Stay..  You may shave your face/neck.  Please follow these instructions carefully:  1.  Shower with CHG Soap the night before surgery and the  morning of Surgery.  2.  If you choose to wash your hair, wash your hair first as usual with your  normal  shampoo.  3.  After you shampoo, rinse your hair and body thoroughly to remove the  shampoo.                                        4.  Use CHG as you would any other liquid soap.  You can apply chg directly  to the skin and wash                       Gently with a scrungie or clean washcloth.  5.  Apply the CHG Soap to your body ONLY FROM THE NECK DOWN.   Do not use on face/ open                           Wound or open sores. Avoid contact with eyes, ears mouth and genitals (private parts).                       Wash face,  Genitals (private parts) with your normal soap.             6.  Wash thoroughly, paying special attention to the area where your surgery  will be performed.  7.  Thoroughly rinse your body with warm water from the neck down.  8.  DO NOT shower/wash with your normal soap after using and rinsing off  the CHG Soap.  9.  Pat yourself dry with a clean towel.            10.  Wear clean pajamas.            11.  Place clean sheets on your bed the night of your first shower and do not  sleep with pets. Day of Surgery : Do not apply any lotions/deodorants the morning of surgery.  Please wear clean clothes to the hospital/surgery  center.  FAILURE TO FOLLOW THESE INSTRUCTIONS MAY RESULT IN THE CANCELLATION OF YOUR SURGERY PATIENT SIGNATURE_________________________________  NURSE SIGNATURE__________________________________  ________________________________________________________________________   Adam Phenix  An incentive spirometer is a tool that can help keep your lungs clear and active. This tool measures how well you are filling your lungs with each breath. Taking long deep breaths may help reverse or decrease the chance of developing breathing (pulmonary) problems (especially infection) following:  A long period of time when you are unable to move or be active. BEFORE THE PROCEDURE   If the spirometer includes an indicator to show your best effort, your nurse or respiratory therapist will set it to a desired goal.  If possible, sit up straight or lean slightly forward. Try not to slouch.  Hold the incentive spirometer in an upright position. INSTRUCTIONS FOR USE  1. Sit on the edge of your bed if possible, or sit up as far as you can in bed or on a chair. 2. Hold the incentive spirometer in an upright position. 3. Breathe out normally. 4. Place the mouthpiece in your mouth and seal your lips tightly around it. 5. Breathe in slowly and as deeply as possible, raising the piston or the ball toward the top of the column. 6. Hold your breath for 3-5 seconds or for as long as possible. Allow the piston or ball to fall to the bottom of the column. 7. Remove the mouthpiece from your mouth and breathe out normally. 8. Rest for a few seconds and repeat Steps 1 through 7 at least 10 times every 1-2 hours when you are awake. Take your time and take a few normal breaths between deep breaths. 9. The spirometer may include an indicator to show your best effort. Use the indicator as a goal to work toward during each repetition. 10. After each set of 10 deep breaths, practice coughing to be sure your lungs are  clear. If you have an incision (the cut made at the time of surgery), support your incision when coughing by placing a pillow or rolled up towels firmly against it. Once you are able to get out of bed, walk around indoors and cough well. You may stop using the incentive spirometer when instructed by your caregiver.  RISKS AND COMPLICATIONS  Take your time so you do not get dizzy or light-headed.  If you are in pain, you may need to take or ask for pain medication before doing incentive spirometry. It is harder to take a deep breath if you are having pain. AFTER USE  Rest and breathe slowly and easily.  It can be helpful to keep track of a log of your progress. Your caregiver can provide you with a simple table to help with this. If you are using the spirometer at home, follow these instructions: [place] IF:   You are having difficultly using the spirometer.  You have trouble using the spirometer as often as instructed.  Your pain medication is not giving enough relief while using the spirometer.  You  develop fever of 100.5 F (38.1 C) or higher. SEEK IMMEDIATE MEDICAL CARE IF:   You cough up bloody sputum that had not been present before.  You develop fever of 102 F (38.9 C) or greater.  You develop worsening pain at or near the incision site. MAKE SURE YOU:   Understand these instructions.  Will watch your condition.  Will get help right away if you are not doing well or get worse. Document Released: 12/23/2006 Document Revised: 11/04/2011 Document Reviewed: 02/23/2007 [facility] Patient Information 2014 [place], [place].   ________________________________________________________________________

## 2020-11-20 ENCOUNTER — Encounter (HOSPITAL_COMMUNITY): Payer: Self-pay

## 2020-11-20 ENCOUNTER — Encounter (HOSPITAL_COMMUNITY)
Admission: RE | Admit: 2020-11-20 | Discharge: 2020-11-20 | Disposition: A | Discharge status: Home / Self Care | Payer: Medicare PPO | Source: Ambulatory Visit | Attending: Orthopedic Surgery | Admitting: Orthopedic Surgery

## 2020-11-20 ENCOUNTER — Other Ambulatory Visit: Payer: Self-pay

## 2020-11-20 DIAGNOSIS — Z01812 Encounter for preprocedural laboratory examination: Secondary | ICD-10-CM | POA: Diagnosis not present

## 2020-11-20 LAB — BASIC METABOLIC PANEL
Anion gap: 9 (ref 5–15)
BUN: 23 mg/dL (ref 8–23)
CO2: 23 mmol/L (ref 22–32)
Calcium: 9.6 mg/dL (ref 8.9–10.3)
Chloride: 107 mmol/L (ref 98–111)
Creatinine, Ser: 1.1 mg/dL (ref 0.61–1.24)
GFR, Estimated: >60 mL/min (ref >60)
Glucose, Bld: 87 mg/dL (ref 70–99)
Potassium: 3.9 mmol/L (ref 3.5–5.1)
Sodium: 139 mmol/L (ref 135–145)

## 2020-11-20 LAB — HEMOGLOBIN A1C
Hgb A1c MFr Bld: 5.7 % — ABNORMAL HIGH (ref 4.8–5.6)
Mean Plasma Glucose: 116.89 mg/dL

## 2020-11-20 LAB — CBC
HCT: 51.6 % (ref 39.0–52.0)
Hemoglobin: 17.1 g/dL — ABNORMAL HIGH (ref 13.0–17.0)
MCH: 29.8 pg (ref 26.0–34.0)
MCHC: 33.1 g/dL (ref 30.0–36.0)
MCV: 90.1 fL (ref 80.0–100.0)
Platelets: 211 10*3/uL (ref 150–400)
RBC: 5.73 MIL/uL (ref 4.22–5.81)
RDW: 13.4 % (ref 11.5–15.5)
WBC: 4.2 10*3/uL (ref 4.0–10.5)
nRBC: 0 % (ref 0.0–0.2)

## 2020-11-20 LAB — SURGICAL PCR SCREEN
MRSA, PCR: NEGATIVE
Staphylococcus aureus: NEGATIVE

## 2020-11-20 LAB — GLUCOSE, CAPILLARY: Glucose-Capillary: 86 mg/dL (ref 70–99)

## 2020-11-20 NOTE — Progress Notes (Addendum)
COVID Vaccine Completed:yes Date COVID Vaccine completed:11/22/19-booster 07/17/20 COVID vaccine manufacturer:  Moderna     PCP - Dr. Lorenso Courier Cardiologist - Dr. Dulce Sellar  Chest x-ray - no EKG - 11/06/20-epic Stress Test - no ECHO - 06/30/19-epic Cardiac Cath with stent - 11/11/18-epic Pacemaker/ICD device last checked:NA  Sleep Study - yes CPAP - no- 35 lbs wt loss  Fasting Blood Sugar - 90-96 Checks Blood Sugar QD_____ times a day  Blood Thinner Instructions:Plavix /Dr. Dulce Sellar Aspirin Instructions:Stop 5 days prior/ Dr. Ranell Patrick Last Dose:11/13/20  Anesthesia review:   Patient denies shortness of breath, fever, cough and chest pain at PAT appointment yes  Patient verbalized understanding of instructions that were given to them at the PAT appointment. Patient was also instructed that they will need to review over the PAT instructions again at home before surgery.yes No SOB with any activities

## 2020-11-21 ENCOUNTER — Telehealth: Payer: Self-pay

## 2020-11-21 NOTE — Telephone Encounter (Signed)
Can you help me find the fax number for [facility]? It was not included in the pre-op form   Thank you  Noreene Larsson

## 2020-11-21 NOTE — Telephone Encounter (Signed)
[facility] fax is [phone]

## 2020-11-21 NOTE — Telephone Encounter (Signed)
   [facility] Pre-operative Risk Assessment    HEARTCARE STAFF: - Please ensure there is not already an duplicate clearance open for this procedure. - Under Visit Info/Reason for Call, type in Other and utilize the format Clearance MM/DD/YY or Clearance TBD. Do not use dashes or single digits. - If request is for dental extraction, please clarify the # of teeth to be extracted.  Request for surgical clearance:  1. What type of surgery is being performed? Left reverse total shoulder.    2. When is this surgery scheduled? 11/24/2020   3. What type of clearance is required (medical clearance vs. Pharmacy clearance to hold med vs. Both)? Both  4. Are there any medications that need to be held prior to surgery and how long? None noted   5. Practice name and name of physician performing surgery? [facility]. Dr. Esmond Plants.    6. What is the office phone number? [phone]   7.   What is the office fax number?   8.   Anesthesia type (None, local, MAC, general) ? General   Gita Kudo 11/21/2020, 2:35 PM  _________________________________________________________________   (provider comments below)

## 2020-11-21 NOTE — Progress Notes (Signed)
Anesthesia Chart Review   Case: [id number] Date/Time: 11/24/20 1225   Procedure: REVERSE SHOULDER ARTHROPLASTY (Left Shoulder) - interscalene block   Anesthesia type: General   Pre-op diagnosis: Left shoulder osteoarthritis rotator cuff insufficiency   Location: [facility] ROOM 06 / [facility]   Surgeons: Netta Cedars, MD      DISCUSSION:69 y.o. never smoker with h/o HTN, Dm II, OSA on CPAP, recurrent idiopathic pericarditis, asthma, CAD (stent 10/2018), mild aortic stenosis, left shoulder OA scheduled for above procedure 11/24/20 with Dr. Netta Cedars.   Pt last seen by cardiology 11/06/2020. Pt has a h/o recurrent pericarditis. Per OV note, "He continues to do well no pleuritic chest pain fever shortness of breath palpitation or syncope. We will plan to withdraw rilonacept 04/09/2021". CAD stable. He is scheduled for follow up 03/2021.  Cardiac clearance requested.   Addendum 11/22/2020:  Per cardiology preoperative evaluation 11/21/2020, "Chart reviewed as part of pre-operative protocol coverage. Given past medical history and time since last visit, based on ACC/AHA guidelines, Eugene Taylor would be at acceptable risk for the planned procedure without further cardiovascular testing.   Pt was recently seen by Dr. Bettina Gavia 11/06/20 in follow up at which time he was doing very well from a CV standpoint with no anginal symptoms."  Anticipate pt can proceed with planned procedure barring acute status change.   VS: BP 133/88   Pulse 73   Temp 36.7 C (Oral)   Resp 20   Ht 5' 11"  (1.803 m)   Wt 87.1 kg   BMI 26.78 kg/m   PROVIDERS: Algis Greenhouse, MD is PCP   Shirlee More, MD is Cardiologist  LABS: Labs reviewed: Acceptable for surgery. (all labs ordered are listed, but only abnormal results are displayed)  Labs Reviewed  HEMOGLOBIN A1C - Abnormal; Notable for the following components:      Result Value   Hgb A1c MFr Bld 5.7 (*)    All other components within normal limits  CBC - Abnormal;  Notable for the following components:   Hemoglobin 17.1 (*)    All other components within normal limits  SURGICAL PCR SCREEN  BASIC METABOLIC PANEL  GLUCOSE, CAPILLARY     IMAGES:   EKG: 11/06/2020 Rate 83 bpm  NSR Voltage criteria for left ventricular hypertrophy Nonspecific T wave abnormality   CV: Echo 06/30/2019 1. Left ventricular ejection fraction, by visual estimation, is 60 to  65%. The left ventricle has normal function. There is mildly increased  left ventricular hypertrophy.  2. Left ventricular diastolic parameters are consistent with Grade I  diastolic dysfunction (impaired relaxation).  3. Global right ventricle has normal systolic function.The right  ventricular size is normal. No increase in right ventricular wall  thickness.  4. Left atrial size was normal.  5. Right atrial size was normal.  6. The mitral valve is normal in structure. No evidence of mitral valve  regurgitation. No evidence of mitral stenosis.  7. The tricuspid valve is normal in structure. Tricuspid valve  regurgitation is not demonstrated.  8. The aortic valve is normal in structure. Aortic valve regurgitation is  mild. Mild aortic valve stenosis.  9. The pulmonic valve was normal in structure. Pulmonic valve  regurgitation is not visualized.  10. The inferior vena cava is normal in size with greater than 50%  respiratory variability, suggesting right atrial pressure of 3 mmHg.  11. Pericardium normal without effusion.  Past Medical History:  Diagnosis Date  . Abnormal cardiac CT angiography   .  Acute idiopathic pericarditis 04/25/2016   Overview:  2017: hosp, EKG changes, colchicine/indomethacin  . Acute medial meniscal tear 12/15/2014  . Angina pectoris ([place]) 05/05/2018  . Aortic regurgitation 07/07/2018  . Aortic stenosis, mild 07/07/2018  . Arthritis    LEFT SHOULDER  . Asthma    pt unsure  . Benign hypertension 11/27/2015  . Bicuspid aortic valve 05/22/2018  . CAD in  native artery 11/11/2018  . Complication of anesthesia    " during a knee surgery , my blood pressure was up & stayed up "  . Coronary artery disease   . Coronary artery disease of native artery of native heart with stable angina pectoris ([place]) 11/09/2018  . 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 External hemorrhoid, thrombosed 12/28/2015   Overview:  2017:   . Fatty liver   . Heart murmur   . History of adenomatous polyp of colon   . History of kidney stones   . Hyperlipidemia   . Hypertension   . Lower respiratory infection 02/08/2016  . Mixed hyperlipidemia 11/27/2015  . Obstructive sleep apnea 11/27/2015  . OSA on CPAP    uses cpap  . 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 Right knee meniscal tear   . Screening for diabetes mellitus (DM) 11/27/2015  . Type 2 diabetes mellitus without complication, without long-term current use of insulin ([place]) 11/27/2015   Formatting of this note might be different from the original. 2019: 107/6.4 2020: 5.9 2020: 130/7.7  . Wears glasses     Past Surgical History:  Procedure Laterality Date  . APPENDECTOMY  age 18  . CARDIAC CATHETERIZATION  11/11/2018  . CHONDROPLASTY Right 12/15/2014   Procedure: CHONDROPLASTY;  Surgeon: Gaynelle Arabian, MD;  Location: [facility];  Service: Orthopedics;  Laterality: Right;  . COLONOSCOPY W/ POLYPECTOMY  2011  . CORONARY STENT INTERVENTION  11/11/2018  . CORONARY STENT INTERVENTION N/A 11/11/2018   Procedure: CORONARY STENT INTERVENTION;  Surgeon: Troy Sine, MD;  Location: [facility];  Service: Cardiovascular;  Laterality: N/A;  . CYSTO/  URETEROSCOPIC STONE EXTRACTIONS  1995  . KNEE ARTHROSCOPY WITH LATERAL MENISECTOMY Right 12/15/2014   Procedure: KNEE ARTHROSCOPY WITH LATERAL MENISECTOMY;  Surgeon: Gaynelle Arabian, MD;  [facility];  Service: Orthopedics;  Laterality: Right;  . KNEE ARTHROSCOPY WITH MEDIAL MENISECTOMY Right 12/15/2014   Procedure: KNEE  ARTHROSCOPY WITH MEDIAL MENISECTOMY;  Surgeon: Gaynelle Arabian, MD;  Location: [facility];  Service: Orthopedics;  Laterality: Right;  . LEFT HEART CATH AND CORONARY ANGIOGRAPHY N/A 11/11/2018   Procedure: LEFT HEART CATH AND CORONARY ANGIOGRAPHY;  Surgeon: Troy Sine, MD;  Location: [place];  Service: Cardiovascular;  Laterality: N/A;  . NASAL SEPTUM SURGERY  1995  . TOTAL KNEE ARTHROPLASTY Right     MEDICATIONS: . acetaminophen (TYLENOL) 500 MG tablet  . ARCALYST 220 MG SOLR  . b complex vitamins tablet  . cholecalciferol (VITAMIN D3) 25 MCG (1000 UT) tablet  . clindamycin (CLEOCIN) 300 MG capsule  . clopidogrel (PLAVIX) 75 MG tablet  . Colchicine (MITIGARE) 0.6 MG CAPS  . EPINEPHrine 0.3 mg/0.3 mL IJ SOAJ injection  . glucose monitoring kit (FREESTYLE) monitoring kit  . JARDIANCE 25 MG TABS tablet  . nitroGLYCERIN (NITROSTAT) 0.4 MG SL tablet  . rosuvastatin (CRESTOR) 20 MG tablet  . telmisartan (MICARDIS) 20 MG tablet   No current facility-administered medications for this encounter.     Konrad Felix, PA-C [facility] [phone]

## 2020-11-21 NOTE — Telephone Encounter (Signed)
   Primary Cardiologist: [name], MD  Chart reviewed as part of pre-operative protocol coverage. Given past medical history and time since last visit, based on ACC/AHA guidelines, Eugene Taylor would be at acceptable risk for the planned procedure without further cardiovascular testing.   Pt was recently seen by Dr. Dulce Sellar 11/06/20 in follow up at which time he was doing very well from a CV standpoint with no anginal symptoms.   The patient was advised that if he develops new symptoms prior to surgery to contact our office to arrange for a follow-up visit, and he verbalized understanding.  I will route this recommendation to the requesting party via Epic fax function and remove from pre-op pool.  Please call with questions.  Georgie Chard, NP 11/21/2020, 2:59 PM

## 2020-11-22 ENCOUNTER — Other Ambulatory Visit (HOSPITAL_COMMUNITY)
Admission: RE | Admit: 2020-11-22 | Discharge: 2020-11-22 | Disposition: A | Discharge status: Home / Self Care | Payer: Medicare PPO | Source: Ambulatory Visit | Attending: Orthopedic Surgery | Admitting: Orthopedic Surgery

## 2020-11-22 DIAGNOSIS — Z20822 Contact with and (suspected) exposure to covid-19: Secondary | ICD-10-CM | POA: Diagnosis not present

## 2020-11-22 DIAGNOSIS — Z01812 Encounter for preprocedural laboratory examination: Secondary | ICD-10-CM | POA: Diagnosis present

## 2020-11-22 LAB — SARS CORONAVIRUS 2 (TAT 6-24 HRS): SARS Coronavirus 2: NEGATIVE

## 2020-11-22 NOTE — Anesthesia Preprocedure Evaluation (Addendum)
Anesthesia Evaluation  Patient identified by MRN, date of birth, ID band Patient awake    Reviewed: Allergy & Precautions, NPO status , Patient's Chart, lab work & pertinent test results, reviewed documented beta blocker date and time   History of Anesthesia Complications (+) history of anesthetic complications  Airway Mallampati: III  TM Distance: >3 FB Neck ROM: Full    Dental  (+) Teeth Intact, Caps, Dental Advisory Given   Pulmonary asthma , sleep apnea and Continuous Positive Airway Pressure Ventilation ,    Pulmonary exam normal breath sounds clear to auscultation       Cardiovascular hypertension, Pt. on medications + angina with exertion + CAD and + Cardiac Stents  Normal cardiovascular exam+ Valvular Problems/Murmurs AS and AI  Rhythm:Regular Rate:Normal + Systolic murmurs Bicuspid AV with mild AS and AR  Hx/o Pericardial effusion ? Etiology on Rx  LCx DES x 1 LCx 10/2018  EKG 11/06/20 NSR, LVH by voltage  Echo 06/30/19 1. Left ventricular ejection fraction, by visual estimation, is 60 to 65%. The left ventricle has normal function. There is mildly increased left ventricular hypertrophy.  2. Left ventricular diastolic parameters are consistent with Grade I diastolic dysfunction (impaired relaxation).  3. Global right ventricle has normal systolic function.The right ventricular size is normal. No increase in right ventricular wall thickness.  4. Left atrial size was normal.  5. Right atrial size was normal.  6. The mitral valve is normal in structure. No evidence of mitral valve regurgitation. No evidence of mitral stenosis.  7. The tricuspid valve is normal in structure. Tricuspid valve regurgitation is not demonstrated.  8. The aortic valve is normal in structure. Aortic valve regurgitation is mild. Mild aortic valve stenosis.  9. The pulmonic valve was normal in structure. Pulmonic valve regurgitation is not  visualized.  10. The inferior vena cava is normal in size with greater than 50% respiratory variability, suggesting right atrial pressure of 3 mmHg.  11. Pericardium normal without effusion.   Cardiac Cath 11/11/18  Prox RCA lesion is 20% stenosed.  Post Atrio lesion is 20% stenosed.  Ost Cx lesion is 35% stenosed.  Prox Cx to Mid Cx lesion is 85% stenosed.  Ost 2nd Mrg to 2nd Mrg lesion is 80% stenosed.  1st Diag lesion is 50% stenosed.  Ost LAD to Prox LAD lesion is 20% stenosed.  Prox LAD lesion is 55% stenosed.  Prox LAD to Mid LAD lesion is 30% stenosed.  Dist LAD lesion is 80% stenosed.  Post intervention, there is a 0% residual stenosis.  A stent was successfully placed.   Evidence for multivessel coronary calcification with most prominent calcification in the proximal to mid LAD.    The LAD has smooth 20% proximal narrowing.  The first diagonal vessel has a superior branch that has mid narrowing of 50%.  Beyond the first diagonal vessel the LAD has calcification proximally and inferiorly and probable 50 to 60% narrowing followed by 30% irregularity to the mid segment.  The apical portion of the LAD has 80% stenosis.  The left circumflex has 30-40% focal ostial stenosis and has an eccentric 85% stenosis after a small left atrial circumflex branch and before a very small second marginal vessel.  The second marginal vessel has ostial proximal narrowing of 80%.  The RCA is a dominant vessel with mild calcification with 20% proximal narrowing and 20% smooth narrowing in the proximal PLA.  Successful PCI with a Wolverine cutting balloon predilatation and ultimate DES stenting with a  2.5 x 18 mm Resolute Onyx stent postdilated to approximately 2.5 mm with the 85% stenosis being reduced to 0% and brisk TIMI-3 flow.     Neuro/Psych negative neurological ROS  negative psych ROS   GI/Hepatic Neg liver ROS,   Endo/Other  diabetes, Well Controlled, Type 2, Oral  Hypoglycemic AgentsHyperlipidemia  Renal/GU negative Renal ROS  negative genitourinary   Musculoskeletal  (+) Arthritis , Osteoarthritis,  OA left shoulder   Abdominal   Peds  Hematology Plavix therapy- last dose 11/18/20    Anesthesia Other Findings   Reproductive/Obstetrics                          Anesthesia Physical Anesthesia Plan  ASA: III  Anesthesia Plan: General   Post-op Pain Management:  Regional for Post-op pain   Induction: Intravenous  PONV Risk Score and Plan: 3 and Treatment may vary due to age or medical condition, Ondansetron and Dexamethasone  Airway Management Planned: Oral ETT  Additional Equipment:   Intra-op Plan:   Post-operative Plan: Extubation in OR  Informed Consent: I have reviewed the patients History and Physical, chart, labs and discussed the procedure including the risks, benefits and alternatives for the proposed anesthesia with the patient or authorized representative who has indicated his/her understanding and acceptance.     Dental advisory given  Plan Discussed with: CRNA and Anesthesiologist  Anesthesia Plan Comments: (See PAT note 11/20/2020, Jodell Cipro, PA-C)       Anesthesia Quick Evaluation

## 2020-11-22 NOTE — Telephone Encounter (Signed)
I have faxed clearance over to requesting office Dr. Malon Kindle. I will remove from the pre op call back pool.

## 2020-11-24 ENCOUNTER — Encounter (HOSPITAL_COMMUNITY)
Admission: RE | Disposition: A | Discharge status: Home / Self Care | Payer: Self-pay | Source: Home / Self Care | Attending: Orthopedic Surgery

## 2020-11-24 ENCOUNTER — Encounter (HOSPITAL_COMMUNITY): Payer: Self-pay | Admitting: Orthopedic Surgery

## 2020-11-24 ENCOUNTER — Other Ambulatory Visit: Payer: Self-pay

## 2020-11-24 ENCOUNTER — Ambulatory Visit (HOSPITAL_COMMUNITY): Payer: Medicare PPO | Admitting: Certified Registered Nurse Anesthetist

## 2020-11-24 ENCOUNTER — Ambulatory Visit (HOSPITAL_COMMUNITY): Payer: Medicare PPO | Admitting: Physician Assistant

## 2020-11-24 ENCOUNTER — Ambulatory Visit (HOSPITAL_COMMUNITY)
Admission: RE | Admit: 2020-11-24 | Discharge: 2020-11-24 | Disposition: A | Discharge status: Home / Self Care | Payer: Medicare PPO | Attending: Orthopedic Surgery | Admitting: Orthopedic Surgery

## 2020-11-24 ENCOUNTER — Ambulatory Visit (HOSPITAL_COMMUNITY): Payer: Medicare PPO

## 2020-11-24 DIAGNOSIS — Z7902 Long term (current) use of antithrombotics/antiplatelets: Secondary | ICD-10-CM | POA: Insufficient documentation

## 2020-11-24 DIAGNOSIS — Z96612 Presence of left artificial shoulder joint: Secondary | ICD-10-CM

## 2020-11-24 DIAGNOSIS — M19012 Primary osteoarthritis, left shoulder: Secondary | ICD-10-CM | POA: Diagnosis not present

## 2020-11-24 DIAGNOSIS — Z7984 Long term (current) use of oral hypoglycemic drugs: Secondary | ICD-10-CM | POA: Diagnosis not present

## 2020-11-24 DIAGNOSIS — Z79899 Other long term (current) drug therapy: Secondary | ICD-10-CM | POA: Insufficient documentation

## 2020-11-24 HISTORY — PX: REVERSE SHOULDER ARTHROPLASTY: SHX5054

## 2020-11-24 LAB — GLUCOSE, CAPILLARY
Glucose-Capillary: 86 mg/dL (ref 70–99)
Glucose-Capillary: 90 mg/dL (ref 70–99)

## 2020-11-24 SURGERY — ARTHROPLASTY, SHOULDER, TOTAL, REVERSE
Anesthesia: General | Site: Shoulder | Laterality: Left

## 2020-11-24 MED ORDER — LIDOCAINE HCL (CARDIAC) PF 100 MG/5ML IV SOSY
PREFILLED_SYRINGE | INTRAVENOUS | Status: DC | PRN
  Administered 2020-11-24: 60 mg via INTRAVENOUS

## 2020-11-24 MED ORDER — ROCURONIUM BROMIDE 10 MG/ML (PF) SYRINGE
PREFILLED_SYRINGE | INTRAVENOUS | Status: DC | PRN
  Administered 2020-11-24: 70 mg via INTRAVENOUS

## 2020-11-24 MED ORDER — PHENYLEPHRINE HCL (PRESSORS) 10 MG/ML IV SOLN
INTRAVENOUS | Status: AC
  Filled 2020-11-24: qty 1

## 2020-11-24 MED ORDER — BUPIVACAINE HCL (PF) 0.5 % IJ SOLN
INTRAMUSCULAR | Status: DC | PRN
  Administered 2020-11-24: 20 mL via PERINEURAL

## 2020-11-24 MED ORDER — PROPOFOL 10 MG/ML IV BOLUS
INTRAVENOUS | Status: AC
  Filled 2020-11-24: qty 20

## 2020-11-24 MED ORDER — LIDOCAINE 2% (20 MG/ML) 5 ML SYRINGE
INTRAMUSCULAR | Status: AC
  Filled 2020-11-24: qty 5

## 2020-11-24 MED ORDER — FENTANYL CITRATE (PF) 100 MCG/2ML IJ SOLN: 25.0000 ug | INTRAMUSCULAR | Status: DC | PRN

## 2020-11-24 MED ORDER — FENTANYL CITRATE (PF) 100 MCG/2ML IJ SOLN
50.0000 ug | INTRAMUSCULAR | Status: DC
  Administered 2020-11-24: 50 ug via INTRAVENOUS
  Filled 2020-11-24: qty 2

## 2020-11-24 MED ORDER — DEXAMETHASONE SODIUM PHOSPHATE 10 MG/ML IJ SOLN
INTRAMUSCULAR | Status: DC | PRN
  Administered 2020-11-24: 10 mg via INTRAVENOUS

## 2020-11-24 MED ORDER — SUGAMMADEX SODIUM 200 MG/2ML IV SOLN
INTRAVENOUS | Status: DC | PRN
  Administered 2020-11-24: 200 mg via INTRAVENOUS

## 2020-11-24 MED ORDER — ORAL CARE MOUTH RINSE: 15.0000 mL | Freq: Once | OROMUCOSAL | Status: AC

## 2020-11-24 MED ORDER — MIDAZOLAM HCL 2 MG/2ML IJ SOLN
1.0000 mg | INTRAMUSCULAR | Status: DC
  Administered 2020-11-24: 1 mg via INTRAVENOUS
  Filled 2020-11-24: qty 2

## 2020-11-24 MED ORDER — FENTANYL CITRATE (PF) 100 MCG/2ML IJ SOLN
INTRAMUSCULAR | Status: AC
  Filled 2020-11-24: qty 2

## 2020-11-24 MED ORDER — BUPIVACAINE LIPOSOME 1.3 % IJ SUSP
INTRAMUSCULAR | Status: DC | PRN
  Administered 2020-11-24: 10 mL via PERINEURAL

## 2020-11-24 MED ORDER — CHLORHEXIDINE GLUCONATE 0.12 % MT SOLN
15.0000 mL | Freq: Once | OROMUCOSAL | Status: AC
  Administered 2020-11-24: 15 mL via OROMUCOSAL

## 2020-11-24 MED ORDER — FENTANYL CITRATE (PF) 100 MCG/2ML IJ SOLN
INTRAMUSCULAR | Status: DC | PRN
  Administered 2020-11-24: 100 ug via INTRAVENOUS

## 2020-11-24 MED ORDER — ONDANSETRON HCL 4 MG PO TABS: 4.0000 mg | ORAL_TABLET | Freq: Three times a day (TID) | ORAL | 1 refills | Status: DC | PRN

## 2020-11-24 MED ORDER — CEFAZOLIN SODIUM-DEXTROSE 2-4 GM/100ML-% IV SOLN
INTRAVENOUS | Status: AC
  Filled 2020-11-24: qty 100

## 2020-11-24 MED ORDER — BUPIVACAINE-EPINEPHRINE (PF) 0.25% -1:200000 IJ SOLN
INTRAMUSCULAR | Status: AC
  Filled 2020-11-24: qty 30

## 2020-11-24 MED ORDER — ROCURONIUM BROMIDE 10 MG/ML (PF) SYRINGE
PREFILLED_SYRINGE | INTRAVENOUS | Status: AC
  Filled 2020-11-24: qty 10

## 2020-11-24 MED ORDER — ONDANSETRON HCL 4 MG/2ML IJ SOLN
INTRAMUSCULAR | Status: DC | PRN
  Administered 2020-11-24: 4 mg via INTRAVENOUS

## 2020-11-24 MED ORDER — ONDANSETRON HCL 4 MG/2ML IJ SOLN
INTRAMUSCULAR | Status: AC
  Filled 2020-11-24: qty 2

## 2020-11-24 MED ORDER — CEFAZOLIN SODIUM-DEXTROSE 2-3 GM-%(50ML) IV SOLR
INTRAVENOUS | Status: DC | PRN
  Administered 2020-11-24: 2 g via INTRAVENOUS

## 2020-11-24 MED ORDER — METHOCARBAMOL 500 MG PO TABS: 500.0000 mg | ORAL_TABLET | Freq: Three times a day (TID) | ORAL | 1 refills | Status: DC | PRN

## 2020-11-24 MED ORDER — OXYCODONE-ACETAMINOPHEN 5-325 MG PO TABS: 1.0000 | ORAL_TABLET | ORAL | 0 refills | Status: DC | PRN

## 2020-11-24 MED ORDER — SODIUM CHLORIDE 0.9 % IR SOLN
Status: DC | PRN
  Administered 2020-11-24: 1000 mL

## 2020-11-24 MED ORDER — PHENYLEPHRINE 40 MCG/ML (10ML) SYRINGE FOR IV PUSH (FOR BLOOD PRESSURE SUPPORT)
PREFILLED_SYRINGE | INTRAVENOUS | Status: DC | PRN
  Administered 2020-11-24: 40 ug via INTRAVENOUS
  Administered 2020-11-24 (×2): 80 ug via INTRAVENOUS

## 2020-11-24 MED ORDER — DEXAMETHASONE SODIUM PHOSPHATE 10 MG/ML IJ SOLN
INTRAMUSCULAR | Status: AC
  Filled 2020-11-24: qty 1

## 2020-11-24 MED ORDER — CLINDAMYCIN PHOSPHATE 900 MG/50ML IV SOLN
900.0000 mg | INTRAVENOUS | Status: AC
  Administered 2020-11-24: 900 mg via INTRAVENOUS
  Filled 2020-11-24: qty 50

## 2020-11-24 MED ORDER — PROPOFOL 10 MG/ML IV BOLUS
INTRAVENOUS | Status: DC | PRN
  Administered 2020-11-24: 120 mg via INTRAVENOUS

## 2020-11-24 MED ORDER — ONDANSETRON HCL 4 MG/2ML IJ SOLN: 4.0000 mg | Freq: Once | INTRAMUSCULAR | Status: DC | PRN

## 2020-11-24 MED ORDER — BUPIVACAINE-EPINEPHRINE (PF) 0.25% -1:200000 IJ SOLN
INTRAMUSCULAR | Status: DC | PRN
  Administered 2020-11-24: 15 mL

## 2020-11-24 MED ORDER — PHENYLEPHRINE HCL-NACL 10-0.9 MG/250ML-% IV SOLN
INTRAVENOUS | Status: DC | PRN
  Administered 2020-11-24: 40 ug/min via INTRAVENOUS

## 2020-11-24 MED ORDER — LACTATED RINGERS IV SOLN: INTRAVENOUS | Status: DC

## 2020-11-24 SURGICAL SUPPLY — 75 items
AID PSTN UNV HD RSTRNT DISP (MISCELLANEOUS) ×1
BAG SPEC THK2 15X12 ZIP CLS (MISCELLANEOUS) ×1
BAG ZIPLOCK 12X15 (MISCELLANEOUS) ×1 IMPLANT
BIT DRILL 1.6MX128 (BIT) ×1 IMPLANT
BIT DRILL 170X2.5X (BIT) IMPLANT
BIT DRL 170X2.5X (BIT) ×1
BLADE SAG 18X100X1.27 (BLADE) ×2 IMPLANT
COVER BACK TABLE 60X90IN (DRAPES) ×2 IMPLANT
COVER SURGICAL LIGHT HANDLE (MISCELLANEOUS) ×2 IMPLANT
COVER WAND RF STERILE (DRAPES) IMPLANT
CUP HUMERAL 42 PLUS 3 (Orthopedic Implant) ×1 IMPLANT
DECANTER SPIKE VIAL GLASS SM (MISCELLANEOUS) ×2 IMPLANT
DRAPE INCISE IOBAN 66X45 STRL (DRAPES) ×2 IMPLANT
DRAPE ORTHO SPLIT 77X108 STRL (DRAPES) ×4
DRAPE SHEET LG 3/4 BI-LAMINATE (DRAPES) ×2 IMPLANT
DRAPE SURG ORHT 6 SPLT 77X108 (DRAPES) ×2 IMPLANT
DRAPE TOP 10253 STERILE (DRAPES) ×2 IMPLANT
DRAPE U-SHAPE 47X51 STRL (DRAPES) ×2 IMPLANT
DRILL 2.5 (BIT) ×2
DRSG ADAPTIC 3X8 NADH LF (GAUZE/BANDAGES/DRESSINGS) ×2 IMPLANT
DRSG PAD ABDOMINAL 8X10 ST (GAUZE/BANDAGES/DRESSINGS) ×2 IMPLANT
DURAPREP 26ML APPLICATOR (WOUND CARE) ×2 IMPLANT
ELECT BLADE TIP CTD 4 INCH (ELECTRODE) ×2 IMPLANT
ELECT NDL TIP 2.8 STRL (NEEDLE) ×1 IMPLANT
ELECT NEEDLE TIP 2.8 STRL (NEEDLE) ×2 IMPLANT
ELECT REM PT RETURN 15FT ADLT (MISCELLANEOUS) ×2 IMPLANT
EPIPHSYSI CENTER SZ 2 LT (Shoulder) ×2 IMPLANT
EPIPHYSIS CENTER SZ 2 LT (Shoulder) IMPLANT
FACESHIELD WRAPAROUND (MASK) ×2 IMPLANT
FACESHIELD WRAPAROUND OR TEAM (MASK) ×1 IMPLANT
GAUZE SPONGE 4X4 12PLY STRL (GAUZE/BANDAGES/DRESSINGS) ×2 IMPLANT
GLENOSPHERE XTEND RSA 42 SD +2 (Joint) ×1 IMPLANT
GLOVE BIOGEL PI ORTHO PRO 7.5 (GLOVE) ×1
GLOVE ORTHO TXT STRL SZ7.5 (GLOVE) ×2 IMPLANT
GLOVE PI ORTHO PRO STRL 7.5 (GLOVE) ×1 IMPLANT
GLOVE SURG ORTHO LTX SZ8.5 (GLOVE) ×2 IMPLANT
GLOVE SURG POLY ORTHO LF SZ8 (GLOVE) ×2 IMPLANT
GOWN STRL REUS W/TWL XL LVL3 (GOWN DISPOSABLE) ×4 IMPLANT
KIT BASIN OR (CUSTOM PROCEDURE TRAY) ×2 IMPLANT
KIT TURNOVER KIT A (KITS) ×2 IMPLANT
MANIFOLD NEPTUNE II (INSTRUMENTS) ×2 IMPLANT
METAGLENE DELTA EXTEND (Trauma) IMPLANT
METAGLENE DXTEND (Trauma) ×2 IMPLANT
NDL MAYO CATGUT SZ4 TPR NDL (NEEDLE) IMPLANT
NEEDLE MAYO CATGUT SZ4 (NEEDLE) ×2 IMPLANT
NS IRRIG 1000ML POUR BTL (IV SOLUTION) ×2 IMPLANT
PACK SHOULDER (CUSTOM PROCEDURE TRAY) ×2 IMPLANT
PENCIL SMOKE EVACUATOR (MISCELLANEOUS) IMPLANT
PIN GUIDE 1.2 (PIN) ×1 IMPLANT
PIN GUIDE GLENOPHERE 1.5MX300M (PIN) ×2 IMPLANT
PIN METAGLENE 2.5 (PIN) ×1 IMPLANT
PROTECTOR NERVE ULNAR (MISCELLANEOUS) ×2 IMPLANT
RESTRAINT HEAD UNIVERSAL NS (MISCELLANEOUS) ×2 IMPLANT
SCREW 4.5X24MM (Screw) ×2 IMPLANT
SCREW 4.5X36MM (Screw) ×1 IMPLANT
SCREW BN 24X4.5XLCK STRL (Screw) IMPLANT
SCREW LOCK 42 (Screw) ×1 IMPLANT
SLING ARM FOAM STRAP LRG (SOFTGOODS) ×1 IMPLANT
SMARTMIX MINI TOWER (MISCELLANEOUS)
SPONGE LAP 4X18 RFD (DISPOSABLE) ×1 IMPLANT
STEM DELTA DIA 10 HA (Stem) ×1 IMPLANT
STRIP CLOSURE SKIN 1/2X4 (GAUZE/BANDAGES/DRESSINGS) ×2 IMPLANT
SUCTION FRAZIER HANDLE 10FR (MISCELLANEOUS) ×2
SUCTION TUBE FRAZIER 10FR DISP (MISCELLANEOUS) ×1 IMPLANT
SUT FIBERWIRE #2 38 T-5 BLUE (SUTURE) ×4
SUT MNCRL AB 4-0 PS2 18 (SUTURE) ×2 IMPLANT
SUT VIC AB 0 CT1 36 (SUTURE) ×4 IMPLANT
SUT VIC AB 0 CT2 27 (SUTURE) ×2 IMPLANT
SUT VIC AB 2-0 CT1 27 (SUTURE) ×2
SUT VIC AB 2-0 CT1 TAPERPNT 27 (SUTURE) ×1 IMPLANT
SUTURE FIBERWR #2 38 T-5 BLUE (SUTURE) ×2 IMPLANT
TAPE CLOTH SURG 6X10 WHT LF (GAUZE/BANDAGES/DRESSINGS) ×1 IMPLANT
TOWEL OR 17X26 10 PK STRL BLUE (TOWEL DISPOSABLE) ×2 IMPLANT
TOWER SMARTMIX MINI (MISCELLANEOUS) IMPLANT
YANKAUER SUCT BULB TIP NO VENT (SUCTIONS) ×1 IMPLANT

## 2020-11-24 NOTE — Transfer of Care (Signed)
Immediate Anesthesia Transfer of Care Note  Patient: Eugene Taylor  Procedure(s) Performed: REVERSE SHOULDER ARTHROPLASTY (Left Shoulder)  Patient Location: PACU  Anesthesia Type:GA combined with regional for post-op pain  Level of Consciousness: awake, drowsy and patient cooperative  Airway & Oxygen Therapy: Patient Spontanous Breathing and Patient connected to face mask oxygen  Post-op Assessment: Report given to RN and Post -op Vital signs reviewed and stable  Post vital signs: Reviewed and stable   Last Vitals:  Vitals Value Taken Time  BP 166/106 11/24/20 1457  Temp    Pulse 75 11/24/20 1459  Resp 15 11/24/20 1459  SpO2 97 % 11/24/20 1459  Vitals shown include unvalidated device data.  Last Pain:  Vitals:   11/24/20 1200  TempSrc: Oral  PainSc:       Patients Stated Pain Goal: 4 (11/24/20 1100)  Complications: No complications documented.

## 2020-11-24 NOTE — Anesthesia Postprocedure Evaluation (Signed)
Anesthesia Post Note  Patient: Eugene Taylor  Procedure(s) Performed: REVERSE SHOULDER ARTHROPLASTY (Left Shoulder)     Patient location during evaluation: PACU Anesthesia Type: General Level of consciousness: awake and alert and oriented Pain management: pain level controlled Vital Signs Assessment: post-procedure vital signs reviewed and stable Respiratory status: spontaneous breathing, nonlabored ventilation and respiratory function stable Cardiovascular status: blood pressure returned to baseline and stable Postop Assessment: no apparent nausea or vomiting Anesthetic complications: no   No complications documented.  Last Vitals:  Vitals:   11/24/20 1545 11/24/20 1615  BP: (!) 131/91 111/86  Pulse: 72 73  Resp: 12 14  Temp:    SpO2: 94% 96%    Last Pain:  Vitals:   11/24/20 1615  TempSrc:   PainSc: 0-No pain                 Shantanu Strauch A.

## 2020-11-24 NOTE — Brief Op Note (Signed)
11/24/2020  3:00 PM  PATIENT:  Eugene Taylor  70 y.o. male  PRE-OPERATIVE DIAGNOSIS:  Left shoulder osteoarthritis rotator cuff insufficiency  POST-OPERATIVE DIAGNOSIS:  Left shoulder osteoarthritis rotator cuff insufficiency  PROCEDURE:  Procedure(s) with comments: REVERSE SHOULDER ARTHROPLASTY (Left) - interscalene block DePuy Delta Xtend with NO subscap repair  SURGEON:  Surgeon(s) and Role:    Beverely Low, MD - Primary  PHYSICIAN ASSISTANT:   ASSISTANTS: Thea Gist, PA-C   ANESTHESIA:   regional and general  EBL:  100 mL   BLOOD ADMINISTERED:none  DRAINS: none   LOCAL MEDICATIONS USED:  MARCAINE     SPECIMEN:  No Specimen  DISPOSITION OF SPECIMEN:  N/A  COUNTS:  YES  TOURNIQUET:  * No tourniquets in log *  DICTATION: .Other Dictation: Dictation Number [id number]  PLAN OF CARE: Discharge to home after PACU  PATIENT DISPOSITION:  PACU - hemodynamically stable.   Delay start of Pharmacological VTE agent (>24hrs) due to surgical blood loss or risk of bleeding: not applicable

## 2020-11-24 NOTE — Discharge Instructions (Signed)
Ice to the shoulder constantly.  Keep the incision covered and clean and dry for one week, then ok to get it wet in the shower.  Do exercise as instructed several times per day. Do not overdo your exercises   Pendulums : dangle arm in circles at your side Lap slides : leave your arm on your lap and slide your palm from your hip to your knee and back and forth Gentle hand to face exercises and rotation : with assistance from the right hand as needed move your hand from your lap to your chest and your face. Ok to button buttons or feed yourself.   DO NOT push pull or lift with the left arm yet.   DO NOT reach behind your back or push up out of a chair with the operative arm.  Use a sling while you are up and around for comfort, may remove while seated.  Keep pillow propped behind the operative elbow.  Follow up with Dr Ranell Patrick in two weeks in the office, call [phone] for appt

## 2020-11-24 NOTE — Anesthesia Procedure Notes (Signed)
Anesthesia Regional Block: Interscalene brachial plexus block   Pre-Anesthetic Checklist: ,, timeout performed, Correct Patient, Correct Site, Correct Laterality, Correct Procedure, Correct Position, site marked, Risks and benefits discussed,  Surgical consent,  Pre-op evaluation,  At surgeon's request and post-op pain management  Laterality: Left  Prep: chloraprep       Needles:  Injection technique: Single-shot  Needle Type: Echogenic Stimulator Needle     Needle Length: 9cm  Needle Gauge: 21   Needle insertion depth: 5 cm   Additional Needles:   Procedures:,,,, ultrasound used (permanent image in chart),,,,  Motor weakness within 5 minutes.  Narrative:  Start time: 11/24/2020 11:58 AM End time: 11/24/2020 12:03 PM Injection made incrementally with aspirations every 5 mL.  Performed by: Personally  Anesthesiologist: Mal Amabile, MD  Additional Notes: Timeout performed. Patient sedated. Relevant anatomy ID'd using [place]. Incremental 2-39ml injection of LA with frequent aspiration. Patient tolerated procedure well.        Left Interscalene Block

## 2020-11-24 NOTE — Evaluation (Signed)
Occupational Therapy Evaluation Patient Details Name: Eugene Taylor MRN: [id number] DOB: [birth year] Today's Date: 11/24/2020    History of Present Illness This 70 y.o. male admitted for Lt reverse TSA.  PMH includes: DM, recurrent ideopathic pericarditis, OSA, HTN, CAD, Aortic stenosis; s/p Rt TKA   Clinical Impression   Patient evaluated by Occupational Therapy with no further acute OT needs identified. All education has been completed and the patient has no further questions. All education completed. Pt and wife demonstrate understanding of precautions and safety with ADLs.  See below for any follow-up Occupational Therapy or equipment needs. OT is signing off. Thank you for this referral.      Follow Up Recommendations  Supervision - Intermittent;Follow surgeon's recommendation for DC plan and follow-up therapies    Equipment Recommendations  None recommended by OT    Recommendations for Other Services       Precautions / Restrictions Precautions Precautions: Shoulder Type of Shoulder Precautions: A/PROM FF 90; ER 30; Abd 60 sling on for comfort and when sleeping Shoulder Interventions: For comfort;Shoulder sling/immobilizer (and sleep) Precaution Booklet Issued: Yes (comment) Precaution Comments: Pt provided with shoulder education handout, elbow, wrist and hand HEP, and sling instructions Required Braces or Orthoses: Sling Restrictions Weight Bearing Restrictions: Yes LUE Weight Bearing: Non weight bearing      Mobility Bed Mobility               General bed mobility comments: sitting in recliner.  He and wife report he will sleep in recliner at discharge    Transfers Overall transfer level: Needs assistance Equipment used: None Transfers: Sit to/from [employer] Sit to Stand: Supervision Stand pivot transfers: Supervision       General transfer comment: discussed general safety and fall reduction techniques    Balance Overall balance  assessment: No apparent balance deficits (not formally assessed)                                         ADL either performed or assessed with clinical judgement   ADL Overall ADL's : Needs assistance/impaired         Upper Body Bathing: Moderate assistance;Sitting   Lower Body Bathing: Minimal assistance;Sit to/from stand   Upper Body Dressing : Moderate assistance;Sitting   Lower Body Dressing: Minimal assistance;Sit to/from stand   Toilet Transfer: Supervision/safety;Stand-pivot           Functional mobility during ADLs: Supervision/safety General ADL Comments: wife supportive     Vision         Perception     Praxis      Pertinent Vitals/Pain Pain Assessment: No/denies pain (block has not worn off)     Hand Dominance Right   Extremity/Trunk Assessment Upper Extremity Assessment Upper Extremity Assessment: LUE deficits/detail LUE Deficits / Details: s/p Lt Reverse TSA.  Block has not worn off   Lower Extremity Assessment Lower Extremity Assessment: Overall WFL for tasks assessed   Cervical / Trunk Assessment Cervical / Trunk Assessment: Normal   Communication Communication Communication: No difficulties   Cognition Arousal/Alertness: Awake/alert Behavior During Therapy: WFL for tasks assessed/performed Overall Cognitive Status: Within Functional Limits for tasks assessed                                     General Comments  Exercises Exercises: Shoulder Shoulder Exercises Elbow Flexion: Left;AAROM;5 reps;Seated Elbow Extension: Left;5 reps;Seated;AAROM Wrist Flexion: Left;5 reps;Seated Wrist Extension: Left;AROM;5 reps;Seated Digit Composite Flexion: Left;5 reps;Seated Composite Extension: Left;5 reps;Seated Neck Flexion: AROM;5 reps;Seated Neck Extension: AROM;5 reps;Seated Neck Lateral Flexion - Right: AROM;5 reps;Seated Neck Lateral Flexion - Left: AROM;5 reps;Seated   Shoulder Instructions  Shoulder Instructions Donning/doffing shirt without moving shoulder: Caregiver independent with task;Moderate assistance Method for sponge bathing under operated UE: Supervision/safety;Caregiver independent with task Donning/doffing sling/immobilizer: Moderate assistance;Caregiver independent with task Correct positioning of sling/immobilizer: Supervision/safety;Caregiver independent with task ROM for elbow, wrist and digits of operated UE: Moderate assistance;Caregiver independent with task Sling wearing schedule (on at all times/off for ADL's): Independent Proper positioning of operated UE when showering: Supervision/safety;Caregiver independent with task Positioning of UE while sleeping: Supervision/safety;Caregiver independent with task    Home Living Family/patient expects to be discharged to:: Private residence Living Arrangements: Spouse/significant other Available Help at Discharge: Family;Available 24 hours/day                                    Prior Functioning/Environment Level of Independence: Independent                 OT Problem List: Decreased strength;Decreased range of motion;Decreased knowledge of precautions;Impaired UE functional use      OT Treatment/Interventions:      OT Goals(Current goals can be found in the care plan section) Acute Rehab OT Goals Patient Stated Goal: to regain some function of shoulder OT Goal Formulation: All assessment and education complete, DC therapy  OT Frequency:     Barriers to D/C:            Co-evaluation              AM-PAC OT "6 Clicks" Daily Activity     Outcome Measure Help from another person eating meals?: None Help from another person taking care of personal grooming?: A Little Help from another person toileting, which includes using toliet, bedpan, or urinal?: A Little Help from another person bathing (including washing, rinsing, drying)?: A Little Help from another person to put on and  taking off regular upper body clothing?: A Lot Help from another person to put on and taking off regular lower body clothing?: A Little 6 Click Score: 18   End of Session Equipment Utilized During Treatment: Other (comment) (sling) Nurse Communication: Mobility status;Precautions  Activity Tolerance: Patient tolerated treatment well Patient left: in chair;with call bell/phone within reach;with family/visitor present  OT Visit Diagnosis: Pain Pain - Right/Left: Left Pain - part of body: Shoulder                Time: 1730-1806 OT Time Calculation (min): 36 min Charges:  OT General Charges $OT Visit: 1 Visit OT Evaluation $OT Eval Moderate Complexity: 1 Mod OT Treatments $Self Care/Home Management : 8-22 mins  Eber Jones., OTR/L Acute Rehabilitation Services Pager [phone] Office [phone]   Jeani Hawking M 11/24/2020, 6:30 PM

## 2020-11-24 NOTE — Progress Notes (Signed)
Assisted Dr. Foster with left, ultrasound guided, interscalene  block. Side rails up, monitors on throughout procedure. See vital signs in flow sheet. Tolerated Procedure well.  

## 2020-11-24 NOTE — Anesthesia Procedure Notes (Signed)
Procedure Name: Intubation Date/Time: 11/24/2020 1:10 PM Performed by: Raenette Rover, CRNA Pre-anesthesia Checklist: Patient identified, Emergency Drugs available, Patient being monitored and Suction available Patient Re-evaluated:Patient Re-evaluated prior to induction Oxygen Delivery Method: Circle system utilized Preoxygenation: Pre-oxygenation with 100% oxygen Induction Type: IV induction Ventilation: Mask ventilation without difficulty Laryngoscope Size: Mac and 4 Grade View: Grade I Tube type: Oral Tube size: 7.5 mm Number of attempts: 1 Airway Equipment and Method: Stylet Placement Confirmation: ETT inserted through vocal cords under direct vision,  positive ETCO2 and breath sounds checked- equal and bilateral Secured at: 22 cm Tube secured with: Tape Dental Injury: Teeth and Oropharynx as per pre-operative assessment

## 2020-11-24 NOTE — Interval H&P Note (Signed)
History and Physical Interval Note:  11/24/2020 12:28 PM  Eugene Taylor  has presented today for surgery, with the diagnosis of Left shoulder osteoarthritis rotator cuff insufficiency.  The various methods of treatment have been discussed with the patient and family. After consideration of risks, benefits and other options for treatment, the patient has consented to  Procedure(s) with comments: REVERSE SHOULDER ARTHROPLASTY (Left) - interscalene block as a surgical intervention.  The patient's history has been reviewed, patient examined, no change in status, stable for surgery.  I have reviewed the patient's chart and labs.  Questions were answered to the patient's satisfaction.     Verlee Rossetti

## 2020-11-25 NOTE — Op Note (Signed)
NAME: Eugene Taylor, Eugene Taylor MEDICAL RECORD NO: [id number] ACCOUNT NO: [id number] DATE OF BIRTH: August 22, 1951 FACILITY: [facility] LOCATION: [facility] PHYSICIAN: Almedia Balls. 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  PROCEDURE PERFORMED:  Left reverse shoulder replacement with DePuy Delta Xtend prosthesis and no subscapularis repair.  ATTENDING SURGEON:  Malon Kindle, MD  ASSISTANT:  Modesto Charon, [place], who was scrubbed during the entire procedure, and necessary for satisfactory completion of surgery.  General anesthesia was used plus interscalene block.  ESTIMATED BLOOD LOSS:  Less than 100 mL  FLUID REPLACEMENT:  1500 mL crystalloid.  INSTRUMENT COUNTS:  Correct.  COMPLICATIONS:  No complications.  ANTIBIOTICS:  Perioperative antibiotics were given.  INDICATIONS:  The patient is a 70 year old male who presents with a history of worsening left shoulder pain and dysfunction secondary to rotator cuff tear arthropathy.  The patient has had end-stage arthritis now for some time, but function has worsened  and pain has progressed.  He is now having pain at rest and pain at night, interfering with ADLs.  Having failed an extended period of conservative management, the patient presents now desiring reverse shoulder replacement to restore fixed focal  mechanics and eliminate pain related to his arthritic shoulder and restore function.  Informed consent obtained.  DESCRIPTION OF PROCEDURE:  After an adequate level of anesthesia was achieved, the patient was positioned in the modified beach chair position.  Left shoulder correctly identified and sterilely prepped and draped in the usual manner.  Time-out called,  verifying correct patient, correct site.  We entered the patient shoulder using a  standard deltopectoral incision starting at the coracoid process extending down to the anterior humerus.  Dissection down through subcutaneous tissues using Bovie.  We  identified the cephalic vein and took that laterally with the deltoid.  Pectoralis was taken medially.  Conjoined tendon identified and retracted medially.  Deep retractor was placed.  We performed a biceps tenodesis in situ with 0 Vicryl figure-of-eight  suture x2 incorporating part of the pectoral tendon.  We then released the subscapularis subperiosteally off the lesser tuberosity.  The tendon was thinned distally and was in generally poor condition due to multiple large osteophytes within the  insertion site for the subscap.  It was decided to go ahead and tag this for potential repair at the end and see how it looked.  We then retracted the subscap.  We then released the inferior capsule and progressively externally rotated.  The humeral head  was devoid of cartilage.  We released the biceps tendon extending the shoulder.  There was some fairly significant rotator cuff tearing superiorly.  We went ahead and released the supraspinatus remnant and part of the infraspinatus back to the posterior  part.  We then entered the proximal humerus with a size 6 reamer, reaming up to a size 10.  We could not get the 12 down.  We then placed our 10 mm intramedullary resection guide set on 10 degrees of retroversion and excised the head with the  oscillating saw.  This was taken to the back table and cancellous graft was taken out of the humeral head.  We removed excess osteophytes medially and then subluxed the humerus posteriorly.  We gained good exposure of the glenoid face again devoid of  cartilage.  We removed the capsule and labrum and the biceps anchor.  We went ahead and placed our retractors, deep retractors.  There was significant retroversion present.  We were able to correct quite a bit of that with reaming down the high side  anteriorly,  and we placed our guide pin and then did a reaming for the metaglene baseplate.  We left a little bit of the posterior portion of the retroverted glenoid unprepared.  We knew that this would be at least 70% supported with bone underneath the  baseplate plus we would have a peg hole and we would have good fixation superiorly, inferiorly and anteriorly, thus the posterior portion of the plate may not be supported around the periphery.  We went ahead and drilled our peg hole.  We did our  peripheral hand reaming.  We impacted the metaglene baseplate into position.  Again, very pleased with the amount of bony support we had,  we placed a 42 lock screw inferiorly, a 36 lock screw superiorly at the base of the coracoid and a 24 lock screw  anteriorly.  No screw posteriorly.  We went ahead at this point and placed a 42+2 standard glenosphere onto the baseplate and secured that in position with the screwdriver.  Next, we did a finger sweep to make sure no soft tissue was caught up in that  glenosphere bearing.  We then finished the preparation on the humeral side reaming for the 2 left metaphysis.  We then assembled the trial, which was a 10 stem, 2 left metaphysis, set on the 0 setting and impacted that into 10 degrees of retroversion and  then placed a 42+3 poly onto the humeral tray and reduced the shoulder.  We were pleased with our soft tissue tension.  We had good tension with the arm at the patient's side,  appropriate conjoined tensioning, no gapping with inferior pole or external  rotation.  At this point, I removed all trial components from the humeral side, irrigated thoroughly, used available bone graft from the humeral head and impaction grafting technique with a HA coated press-fit, 10 stem and 2 left metaphysis set on the 0  setting and placed in 10 degrees of retroversion.  With that impacted in place and the stem well supported and well fixed, we went ahead and placed the real 42+3 poly onto the  humeral tray and impacted that, locking it.  We reduced the shoulder.  We then  ranged the shoulder around.  No instability or impingement was noted.  We resected the remnant of the subscap as it did not appear to be in very good shape and I just felt like it was not going to add anything for this patient.  Final inspection and  finger sweep of the joint, we did remove multiple large loose bodies as part of the surgery as well as quite a bit of capsule and synovium that especially was thickened and just pathologic in its appearance.  We were careful to protect the axillary nerve  and that was under appropriate tension at the end of surgery.  We then irrigated and closed the deltopectoral interval with 0 Vicryl suture followed by 2-0 Vicryl for subcutaneous closure and 4-0 Monocryl for the skin.  Steri-Strips were applied  followed by sterile dressing.  The patient tolerated the surgery well.   PAA D: 11/24/2020 3:07:46 pm T: 11/25/2020 6:04:00 am  JOB: [id number]/ [id number]

## 2020-11-27 ENCOUNTER — Other Ambulatory Visit: Payer: Self-pay | Admitting: Cardiology

## 2020-11-27 ENCOUNTER — Encounter (HOSPITAL_COMMUNITY): Payer: Self-pay | Admitting: Orthopedic Surgery

## 2020-11-27 NOTE — Telephone Encounter (Signed)
Rx refill sent to pharmacy. 

## 2020-12-01 ENCOUNTER — Telehealth: Payer: Self-pay

## 2020-12-01 NOTE — Telephone Encounter (Signed)
Prior authorization started for patients Colchicine 0.6 mg with [place] [employer]. Ref # [id number]

## 2021-01-16 DIAGNOSIS — U071 COVID-19: Secondary | ICD-10-CM | POA: Insufficient documentation

## 2021-01-16 HISTORY — DX: COVID-19: U07.1

## 2021-01-16 MED ORDER — ROSUVASTATIN CALCIUM 20 MG PO TABS: 20.0000 mg | ORAL_TABLET | Freq: Every day | ORAL | 3 refills | Status: DC

## 2021-05-02 ENCOUNTER — Other Ambulatory Visit: Payer: Self-pay | Admitting: Cardiology

## 2021-05-02 ENCOUNTER — Telehealth: Payer: Self-pay | Admitting: Cardiology

## 2021-05-02 MED ORDER — ARCALYST 220 MG ~~LOC~~ SOLR: 1.0000 | SUBCUTANEOUS | 11 refills | Status: DC

## 2021-05-02 NOTE — Telephone Encounter (Signed)
New rx sent to  [facility], [place] - [phone]

## 2021-05-02 NOTE — Telephone Encounter (Signed)
Refill sent in per request.  

## 2021-05-02 NOTE — Telephone Encounter (Signed)
*  STAT* If patient is at the pharmacy, call can be transferred to refill team.   1. Which medications need to be refilled? (please list name of each medication and dose if known)  Sterile water injections for the arcalyst (ARCALYST 220 MG SOLR)  2. Which pharmacy/location (including street and city if local pharmacy) is medication to be sent to? [facility] [phone] - [place], [place] - [address]  3. Do they need a 30 day or 90 day supply? 28 day supply with 11 refills

## 2021-05-09 ENCOUNTER — Encounter: Payer: Self-pay | Admitting: Cardiology

## 2021-05-09 ENCOUNTER — Other Ambulatory Visit: Payer: Self-pay

## 2021-05-09 ENCOUNTER — Ambulatory Visit: Payer: Medicare PPO | Admitting: Cardiology

## 2021-05-09 VITALS — BP 132/84 | HR 82 | Ht 71.0 in | Wt 196.8 lb

## 2021-05-09 DIAGNOSIS — I300 Acute nonspecific idiopathic pericarditis: Secondary | ICD-10-CM

## 2021-05-09 DIAGNOSIS — I25118 Atherosclerotic heart disease of native coronary artery with other forms of angina pectoris: Secondary | ICD-10-CM

## 2021-05-09 DIAGNOSIS — E782 Mixed hyperlipidemia: Secondary | ICD-10-CM | POA: Diagnosis not present

## 2021-05-09 DIAGNOSIS — E119 Type 2 diabetes mellitus without complications: Secondary | ICD-10-CM

## 2021-05-09 DIAGNOSIS — I10 Essential (primary) hypertension: Secondary | ICD-10-CM

## 2021-05-09 NOTE — Progress Notes (Signed)
Cardiology Office Note:    Date:  05/09/2021   ID:  ASWAD WANDREY, DOB 08/02/1951, MRN [id number]  PCP:  Algis Greenhouse, MD  Cardiologist:  Shirlee More, MD    Referring MD: Algis Greenhouse, MD    ASSESSMENT:    1. Recurrent idiopathic pericarditis   2. Coronary artery disease of native artery of native heart with stable angina pectoris ([place])   3. Essential hypertension   4. Mixed hyperlipidemia   5. Type 2 diabetes mellitus without complication, without long-term current use of insulin (HCC)    PLAN:    In order of problems listed above:  He continues to do well we will stop his immune modulating therapy and continue colchicine both for pericarditis and CAD.  Recheck his inflammatory markers CRP and sedimentation rate along with his CBC. Stable CAD he is having no angina continue medical therapy with clopidogrel and high intensity statin. Well-controlled continue current treatment ARB Stable continue his high intensity statin Well managed on appropriate cardioprotective therapy with SGLT 2 inhibitor   Next appointment: 6 months   Medication Adjustments/Labs and Tests Ordered: Current medicines are reviewed at length with the patient today.  Concerns regarding medicines are outlined above.  Orders Placed This Encounter  Procedures   CBC   Sedimentation rate   C-reactive protein   No orders of the defined types were placed in this encounter.   Chief Complaint  Patient presents with   Follow-up  For relapsing pericarditis  History of Present Illness:    Eugene Taylor is a 70 y.o. male with a hx of recurrent idiopathic pericarditis failing treatment with colchicine nonsteroidal inflammatory drugs and steroids treated with immune modulating medication ranolacept completing 1 year of treatment 04/09/2021 was sent last seen 11/06/2020.Marland KitchenOther cardiac problems include CAD with PCI and stent proximal left circumflex 11/11/2018, mild aortic stenosis and regurgitation,  mild enlargement ascending aorta, hypertensive heart disease and hyper lipidemia .  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  He has had no indication of recurrent pericarditis. He asked if he should stop colchicine I think I would keep him on that although he is now off steroids. Past Medical History:  Diagnosis Date   Abnormal cardiac CT angiography    Acute idiopathic pericarditis 04/25/2016   Overview:  2017: hosp, EKG changes, colchicine/indomethacin   Acute medial meniscal tear 12/15/2014   Angina pectoris ([place]) 05/05/2018   Aortic regurgitation 07/07/2018   Aortic stenosis, mild 07/07/2018   Arthritis    LEFT SHOULDER   Asthma    pt unsure   Benign hypertension 11/27/2015   Bicuspid aortic valve 05/22/2018   CAD in native artery 2/54/2706   Complication of anesthesia    " during a knee surgery , my blood pressure was up & stayed up "   Coronary artery disease    Coronary artery disease of native artery of native heart with stable angina pectoris (Eugene Taylor) 11/09/2018   Enlarged thoracic aorta ([place]) 05/22/2018   External hemorrhoid, thrombosed 12/28/2015   Overview:  2017:    Fatty liver    Heart murmur    History of adenomatous polyp of  colon    History of kidney stones    Hyperlipidemia    Hypertension    Lower respiratory infection 02/08/2016   Mixed hyperlipidemia 11/27/2015   Obstructive sleep apnea 11/27/2015   OSA on CPAP    uses cpap   Recurrent idiopathic pericarditis 12/23/2017   Right knee meniscal tear    Screening for diabetes mellitus (DM) 11/27/2015   Type 2 diabetes mellitus without  complication, without long-term current use of insulin ([place]) 11/27/2015   Formatting of this note might be different from the original. 2019: 107/6.4 2020: 5.9 2020: 130/7.7   Wears glasses     Past Surgical History:  Procedure Laterality Date   APPENDECTOMY  age 70   CARDIAC CATHETERIZATION  11/11/2018   CHONDROPLASTY Right 12/15/2014   Procedure: CHONDROPLASTY;  Surgeon: Gaynelle Arabian, MD;  Location: [facility];  Service: Orthopedics;  Laterality: Right;   COLONOSCOPY W/ POLYPECTOMY  2011   CORONARY STENT INTERVENTION  11/11/2018   CORONARY STENT INTERVENTION N/A 11/11/2018   Procedure: CORONARY STENT INTERVENTION;  Surgeon: Troy Sine, MD;  Location: [facility];  Service: Cardiovascular;  Laterality: N/A;   CYSTO/  URETEROSCOPIC STONE EXTRACTIONS  1995   KNEE ARTHROSCOPY WITH LATERAL MENISECTOMY Right 12/15/2014   Procedure: KNEE ARTHROSCOPY WITH LATERAL MENISECTOMY;  Surgeon: Gaynelle Arabian, MD;  Location: [facility];  Service: Orthopedics;  Laterality: Right;   KNEE ARTHROSCOPY WITH MEDIAL MENISECTOMY Right 12/15/2014   Procedure: KNEE ARTHROSCOPY WITH MEDIAL MENISECTOMY;  Surgeon: Gaynelle Arabian, MD;  Location[facility];  Service: Orthopedics;  Laterality: Right;   LEFT HEART CATH AND CORONARY ANGIOGRAPHY N/A 11/11/2018   Procedure: LEFT HEART CATH AND CORONARY ANGIOGRAPHY;  Surgeon: Troy Sine, MD;  Location: [facility];  Service: Cardiovascular;  Laterality: N/A;   NASAL SEPTUM SURGERY  1995   REVERSE SHOULDER ARTHROPLASTY Left 11/24/2020   Procedure: REVERSE SHOULDER ARTHROPLASTY;  Surgeon: Netta Cedars, MD;  Location: [facility];  Service: Orthopedics;  Laterality: Left;  interscalene block   TOTAL KNEE ARTHROPLASTY Right     Current Medications: Current Meds  Medication Sig   acetaminophen (TYLENOL) 500 MG tablet Take 500 mg by mouth daily as needed (before golfing.).   ARCALYST 220 MG SOLR Inject 1 each into the  skin every 7 (seven) days.   b complex vitamins tablet Take 1 tablet by mouth daily.   cholecalciferol (VITAMIN D3) 25 MCG (1000 UT) tablet Take 1,000 Units by mouth daily.   clindamycin (CLEOCIN) 300 MG capsule Take 2 capsules (600 mg total) by mouth as directed. Take 2 capsules (633m) 45 minutes prior to dental visits   clopidogrel (PLAVIX) 75 MG tablet TAKE 1 TABLET(75 MG) BY MOUTH DAILY (Patient taking differently: Take 75 mg by mouth daily.)   colchicine 0.6 MG tablet TAKE 1 TABLET(0.6 MG) BY MOUTH TWICE DAILY (Patient taking differently: Take 0.6 mg by mouth daily.)   EPINEPHrine 0.3 mg/0.3 mL IJ SOAJ injection Use as directed for life-threatening allergic reaction. (Patient taking differently: Inject 0.3 mg into the muscle as needed for anaphylaxis (meat allergy (alpha gal)).)   glucose monitoring kit (FREESTYLE) monitoring kit 1 each by Does not apply route as needed for other.   JARDIANCE 25 MG TABS tablet Take 25 mg by mouth daily.   nitroGLYCERIN (NITROSTAT) 0.4 MG SL tablet Place 1 tablet (0.4 mg total) under the tongue every 5 (five) minutes as needed for chest pain.   rosuvastatin (CRESTOR) 20 MG tablet  Take 1 tablet (20 mg total) by mouth daily.   telmisartan (MICARDIS) 20 MG tablet Take 1 tablet (20 mg total) by mouth daily.   Water For Injection Sterile (STERILE WATER, PRESERVATIVE FREE,) injection **RX SUPPLY** USE AS USE DIRECTED WITH ARCALYST     Allergies:   Other, Penicillins, and Hydrocodeine [dihydrocodeine]   Social History   Socioeconomic History   Marital status: Married    Spouse name: Not on file   Number of children: Not on file   Years of education: Not on file   Highest education level: Not on file  Occupational History   Not on file  Tobacco Use   Smoking status: Never   Smokeless tobacco: Never  Vaping Use   Vaping Use: Never used  Substance and Sexual Activity   Alcohol use: No   Drug use: No   Sexual activity: Not on file  Other Topics Concern    Not on file  Social History Narrative   Not on file   Social Determinants of Health   Financial Resource Strain: Not on file  Food Insecurity: Not on file  Transportation Needs: Not on file  Physical Activity: Not on file  Stress: Not on file  Social Connections: Not on file     Family History: The patient's family history includes Pancreatic cancer in his father; Stomach cancer in his mother. ROS:   Please see the history of present illness.    All other systems reviewed and are negative.  EKGs/Labs/Other Studies Reviewed:    The following studies were reviewed today:   Recent Labs: 11/06/2020: ALT 25 11/20/2020: BUN 23; Creatinine, Ser 1.10; Hemoglobin 17.1; Platelets 211; Potassium 3.9; Sodium 139  Recent Lipid Panel    Component Value Date/Time   CHOL 131 11/06/2020 1416   TRIG 130 11/06/2020 1416   HDL 48 11/06/2020 1416   CHOLHDL 2.7 11/06/2020 1416   LDLCALC 60 11/06/2020 1416    Physical Exam:    VS:  BP 132/84 (BP Location: Left Arm, Patient Position: Sitting, Cuff Size: Normal)   Pulse 82   Ht 5' 11"  (1.803 m)   Wt 196 lb 12.8 oz (89.3 kg)   SpO2 98%   BMI 27.45 kg/m     Wt Readings from Last 3 Encounters:  05/09/21 196 lb 12.8 oz (89.3 kg)  11/24/20 192 lb (87.1 kg)  11/20/20 192 lb (87.1 kg)     GEN:  Well nourished, well developed in no acute distress HEENT: Normal NECK: No JVD; No carotid bruits LYMPHATICS: No lymphadenopathy CARDIAC: He has no pericardial rub he does have an apical systolic murmur RRR, no  rubs, gallops RESPIRATORY:  Clear to auscultation without rales, wheezing or rhonchi  ABDOMEN: Soft, non-tender, non-distended MUSCULOSKELETAL:  No edema; No deformity  SKIN: Warm and dry NEUROLOGIC:  Alert and oriented x 3 PSYCHIATRIC:  Normal affect    Signed, Shirlee More, MD  05/09/2021 11:19 AM    [place]

## 2021-05-09 NOTE — Patient Instructions (Signed)
Medication Instructions:  Your physician recommends that you continue on your current medications as directed. Please refer to the Current Medication list given to you today.  *If you need a refill on your cardiac medications before your next appointment, please call your pharmacy*   Lab Work: Your physician recommends that you return for lab work in: TODAY CBC, Sed rate, CRP If you have labs (blood work) drawn today and your tests are completely normal, you will receive your results only by: MyChart Message (if you have MyChart) OR A paper copy in the mail If you have any lab test that is abnormal or we need to change your treatment, we will call you to review the results.   Testing/Procedures: None   Follow-Up: At [facility], you and your health needs are our priority.  As part of our continuing mission to provide you with exceptional heart care, we have created designated Provider Care Teams.  These Care Teams include your primary Cardiologist (physician) and Advanced Practice Providers (APPs -  Physician Assistants and Nurse Practitioners) who all work together to provide you with the care you need, when you need it.  We recommend signing up for the patient portal called "MyChart".  Sign up information is provided on this After Visit Summary.  MyChart is used to connect with patients for Virtual Visits (Telemedicine).  Patients are able to view lab/test results, encounter notes, upcoming appointments, etc.  Non-urgent messages can be sent to your provider as well.   To learn more about what you can do with MyChart, go to [email or web address].    Your next appointment:   6 month(s)  The format for your next appointment:   In Person  Provider:   [name], MD   Other Instructions

## 2021-05-10 ENCOUNTER — Other Ambulatory Visit: Payer: Self-pay | Admitting: Cardiology

## 2021-05-10 ENCOUNTER — Telehealth: Payer: Self-pay

## 2021-05-10 LAB — C-REACTIVE PROTEIN: CRP: <1 mg/L (ref 0–10)

## 2021-05-10 LAB — SEDIMENTATION RATE: Sed Rate: 22 mm/hr (ref 0–30)

## 2021-05-10 LAB — CBC
Hematocrit: 50.3 % (ref 37.5–51.0)
Hemoglobin: 17 g/dL (ref 13.0–17.7)
MCH: 29 pg (ref 26.6–33.0)
MCHC: 33.8 g/dL (ref 31.5–35.7)
MCV: 86 fL (ref 79–97)
Platelets: 265 10*3/uL (ref 150–450)
RBC: 5.87 x10E6/uL — ABNORMAL HIGH (ref 4.14–5.80)
RDW: 13.9 % (ref 11.6–15.4)
WBC: 5.3 10*3/uL (ref 3.4–10.8)

## 2021-05-10 NOTE — Telephone Encounter (Signed)
Spoke with patient regarding results and recommendation.  Patient verbalizes understanding and is agreeable to plan of care. Advised patient to call back with any issues or concerns.  

## 2021-05-10 NOTE — Telephone Encounter (Signed)
-----   Message from Baldo Daub, MD sent at 05/10/2021  7:52 AM EDT ----- Normal or stable result  Good result with his inflammatory markers no change in plan

## 2021-05-14 ENCOUNTER — Telehealth: Payer: Self-pay | Admitting: Cardiology

## 2021-05-14 NOTE — Telephone Encounter (Signed)
   Primary Cardiologist: [name], MD  Chart reviewed as part of pre-operative protocol coverage. Simple dental extractions are considered low risk procedures per guidelines and generally do not require any specific cardiac clearance. It is also generally accepted that for simple extractions and dental cleanings, there is no need to interrupt blood thinner therapy.   SBE prophylaxis is required for the patient.  He has a penicillin allergy.  He has a prescription for clindamycin 600 mg to be taken 45 minutes prior to dental visits.    I will route this recommendation to the requesting party via Epic fax function and remove from pre-op pool.  Please call with questions.  Ronney Asters, NP 05/14/2021, 8:33 AM

## 2021-05-14 NOTE — Telephone Encounter (Signed)
     [facility] Pre-operative Risk Assessment    Patient Name: Eugene Taylor  DOB: 08-19-1951 MRN: [id number]  HEARTCARE STAFF:  - IMPORTANT!!!!!! Under Visit Info/Reason for Call, type in Other and utilize the format Clearance MM/DD/YY or Clearance TBD. Do not use dashes or single digits. - Please review there is not already an duplicate clearance open for this procedure. - If request is for dental extraction, please clarify the # of teeth to be extracted. - If the patient is currently at the dentist's office, call Pre-Op Callback Staff (MA/nurse) to input urgent request.  - If the patient is not currently in the dentist office, please route to the Pre-Op pool.  Request for surgical clearance:  What type of surgery is being performed? Routine cleaning  When is this surgery scheduled? 05/14/21  What type of clearance is required (medical clearance vs. Pharmacy clearance to hold med vs. Both)? both  Are there any medications that need to be held prior to surgery and how long? Need to know if pt needs to pre medicate prior dental cleaning  Practice name and name of physician performing surgery? [facility] - Dr. Monico Hoar DDS  What is the office phone number? [phone]   7.   What is the office fax number? [phone]  8.   Anesthesia type (None, local, MAC, general) ? none   Angeline S Hammer 05/14/2021, 8:24 AM  _________________________________________________________________   (provider comments below)

## 2021-05-31 ENCOUNTER — Other Ambulatory Visit: Payer: Self-pay | Admitting: Cardiology

## 2021-06-06 MED ORDER — COLCHICINE 0.6 MG PO TABS: 0.6000 mg | ORAL_TABLET | Freq: Two times a day (BID) | ORAL | 3 refills | Status: DC

## 2021-06-11 ENCOUNTER — Telehealth: Payer: Self-pay

## 2021-06-11 NOTE — Telephone Encounter (Signed)
PA started on [facility] for Colchicine 0.6 mg. Key [id number]

## 2021-06-12 ENCOUNTER — Other Ambulatory Visit: Payer: Self-pay

## 2021-06-12 MED ORDER — COLCHICINE 0.6 MG PO CAPS: 0.6000 mg | ORAL_CAPSULE | Freq: Two times a day (BID) | ORAL | 3 refills | Status: DC

## 2021-06-12 NOTE — Telephone Encounter (Signed)
PA denied for Colchicine.  However patient's insurance formulary is [employer].  Gave Dr. Hulen Shouts nurse the information to see if he would like to switch the medication to the patient's preferred.

## 2021-07-17 ENCOUNTER — Encounter: Payer: Self-pay | Admitting: Cardiology

## 2021-07-18 ENCOUNTER — Other Ambulatory Visit: Payer: Self-pay

## 2021-07-18 DIAGNOSIS — I300 Acute nonspecific idiopathic pericarditis: Secondary | ICD-10-CM

## 2021-07-18 NOTE — Progress Notes (Unsigned)
C-

## 2021-07-19 LAB — CBC
Hematocrit: 47.8 % (ref 37.5–51.0)
Hemoglobin: 15.8 g/dL (ref 13.0–17.7)
MCH: 28.8 pg (ref 26.6–33.0)
MCHC: 33.1 g/dL (ref 31.5–35.7)
MCV: 87 fL (ref 79–97)
Platelets: 251 10*3/uL (ref 150–450)
RBC: 5.48 x10E6/uL (ref 4.14–5.80)
RDW: 13.9 % (ref 11.6–15.4)
WBC: 8.7 10*3/uL (ref 3.4–10.8)

## 2021-07-19 LAB — SEDIMENTATION RATE: Sed Rate: 83 mm/hr — ABNORMAL HIGH (ref 0–30)

## 2021-07-19 LAB — HIGH SENSITIVITY CRP: CRP, High Sensitivity: 58.91 mg/L — ABNORMAL HIGH (ref 0.00–3.00)

## 2021-07-20 NOTE — Progress Notes (Unsigned)
I called reviewed his laboratory tests placed him back on prednisone 0.5 mg/kg 40 mg daily and told him I would explore other treatment options including the potential of pericardial stripping. He request referral to Dr. Lucie Leather allergy and immunology for an opinion on treatment.

## 2021-07-23 ENCOUNTER — Other Ambulatory Visit: Payer: Self-pay | Admitting: Cardiology

## 2021-07-23 ENCOUNTER — Telehealth: Payer: Self-pay | Admitting: *Deleted

## 2021-07-23 NOTE — Telephone Encounter (Signed)
Received a voicemail from pt stating he needed a refill for the Prednisone, to be sent to [facility].  Will route to the [place] office to take care of a pt of Dr. Vanetta Shawl

## 2021-07-23 NOTE — Telephone Encounter (Signed)
Spoke with pt who states that he was told by Dr. Dulce Sellar to restart Prednisone 40 mg daily. No RX was sent. How long does the RX need to be for?

## 2021-07-24 MED ORDER — PREDNISONE 20 MG PO TABS: 40.0000 mg | ORAL_TABLET | Freq: Every day | ORAL | 0 refills | Status: DC

## 2021-07-24 NOTE — Addendum Note (Signed)
Addended by: Delorse Limber I on: 07/24/2021 07:17 AM   Modules accepted: Orders

## 2021-07-24 NOTE — Telephone Encounter (Signed)
Refill sent in per request/recommendations from Dr. Dulce Sellar.

## 2021-08-03 ENCOUNTER — Other Ambulatory Visit: Payer: Self-pay

## 2021-08-03 DIAGNOSIS — E782 Mixed hyperlipidemia: Secondary | ICD-10-CM

## 2021-08-03 DIAGNOSIS — I300 Acute nonspecific idiopathic pericarditis: Secondary | ICD-10-CM

## 2021-08-04 LAB — COMPREHENSIVE METABOLIC PANEL
ALT: 30 IU/L (ref 0–44)
AST: 21 IU/L (ref 0–40)
Albumin/Globulin Ratio: 2.2 (ref 1.2–2.2)
Albumin: 4.4 g/dL (ref 3.8–4.8)
Alkaline Phosphatase: 87 IU/L (ref 44–121)
BUN/Creatinine Ratio: 23 (ref 10–24)
BUN: 27 mg/dL (ref 8–27)
Bilirubin Total: 0.6 mg/dL (ref 0.0–1.2)
CO2: 23 mmol/L (ref 20–29)
Calcium: 9.2 mg/dL (ref 8.6–10.2)
Chloride: 105 mmol/L (ref 96–106)
Creatinine, Ser: 1.17 mg/dL (ref 0.76–1.27)
Globulin, Total: 2 g/dL (ref 1.5–4.5)
Glucose: 86 mg/dL (ref 70–99)
Potassium: 4 mmol/L (ref 3.5–5.2)
Sodium: 142 mmol/L (ref 134–144)
Total Protein: 6.4 g/dL (ref 6.0–8.5)
eGFR: 68 mL/min/{1.73_m2} (ref >59)

## 2021-08-04 LAB — LIPID PANEL
Chol/HDL Ratio: 2.3 ratio (ref 0.0–5.0)
Cholesterol, Total: 128 mg/dL (ref 100–199)
HDL: 55 mg/dL (ref >39)
LDL Chol Calc (NIH): 54 mg/dL (ref 0–99)
Triglycerides: 106 mg/dL (ref 0–149)
VLDL Cholesterol Cal: 19 mg/dL (ref 5–40)

## 2021-08-04 LAB — C-REACTIVE PROTEIN: CRP: <1 mg/L (ref 0–10)

## 2021-08-04 LAB — SEDIMENTATION RATE: Sed Rate: 19 mm/hr (ref 0–30)

## 2021-08-09 ENCOUNTER — Ambulatory Visit: Payer: Medicare PPO | Admitting: Allergy and Immunology

## 2021-08-09 ENCOUNTER — Other Ambulatory Visit: Payer: Self-pay

## 2021-08-09 ENCOUNTER — Encounter: Payer: Self-pay | Admitting: Allergy and Immunology

## 2021-08-09 VITALS — BP 134/84 | HR 84 | Resp 16

## 2021-08-09 DIAGNOSIS — I300 Acute nonspecific idiopathic pericarditis: Secondary | ICD-10-CM | POA: Diagnosis not present

## 2021-08-09 DIAGNOSIS — T7800XA Anaphylactic reaction due to unspecified food, initial encounter: Secondary | ICD-10-CM

## 2021-08-09 NOTE — Progress Notes (Signed)
[place] [facility] [place] [facility] [place]   Follow-up Note  Referring Provider: Algis Greenhouse, MD Primary Provider: Algis Greenhouse, MD Date of Office Visit: 08/09/2021  Subjective:   Eugene Taylor (DOB: [birth year]) is a 70 y.o. 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  I last saw him in this clinic during his initial evaluation of 08 May 2020.  He has really done well while avoiding all mammal consumption and has not had any allergic reactions.  He does have an injectable epinephrine device.  He has recurrent pericarditis that is being handled by Dr. Bettina Gavia, cardiology.  He did visit with Dr. Amil Amen, rheumatology, he has been treated with ranolacept with complete control of his pericarditis.  He did discontinue this agent at the beginning of this fall and unfortunately developed another episode of pericarditis requiring high-dose systemic steroids and colchicine use.  He is now tapering off his prednisone.  Allergies as of 08/09/2021       Reactions   Other Anaphylaxis   Red Meat   Penicillins Other (See Comments)   Did it involve swelling of the face/tongue/throat, SOB, or low BP? Unknown Did it involve sudden or severe rash/hives, skin peeling, or any reaction on the inside of your mouth or nose? Unknown Did you need to seek medical attention at a hospital or doctor's office? Unknown When did it last happen?      Childhood allergy If all above answers are NO, may proceed with cephalosporin use.   Hydrocodeine [dihydrocodeine] Itching        Medication List    acetaminophen 500 MG tablet Commonly known as: TYLENOL Take 500 mg by mouth daily as needed (before golfing.).   b complex vitamins tablet Take 1 tablet by mouth daily.   cholecalciferol 25 MCG (1000 UNIT) tablet Commonly  known as: VITAMIN D3 Take 1,000 Units by mouth daily.   clindamycin 300 MG capsule Commonly known as: CLEOCIN Take 2 capsules (600 mg total) by mouth as directed. Take 2 capsules (648m) 45 minutes prior to dental visits   clopidogrel 75 MG tablet Commonly known as: PLAVIX TAKE 1 TABLET(75 MG) BY MOUTH DAILY   Colchicine 0.6 MG Caps Commonly known as: Mitigare Take 0.6 mg by mouth 2 (two) times daily.   EPINEPHrine 0.3 mg/0.3 mL Soaj injection Commonly known as: EPI-PEN Use as directed for life-threatening allergic reaction.   glucose monitoring kit monitoring kit 1 each by Does not apply route as needed for other.   Jardiance 25 MG Tabs tablet Generic drug: empagliflozin Take 25 mg by mouth daily.   nitroGLYCERIN 0.4 MG SL tablet Commonly known as: Nitrostat Place 1 tablet (0.4 mg total) under the tongue every 5 (five) minutes as needed for chest pain.   predniSONE 20 MG tablet Commonly known as: DELTASONE Take 2 tablets (40 mg total) by mouth daily with breakfast.   rosuvastatin 20 MG tablet Commonly known as: CRESTOR Take 1 tablet (20 mg total) by mouth daily.   telmisartan 20 MG tablet Commonly known as: MICARDIS Take 1 tablet (20 mg total) by mouth daily.    Past Medical History:  Diagnosis Date   Abnormal cardiac CT angiography    Acute idiopathic pericarditis 04/25/2016   Overview:  2017: hosp, EKG changes, colchicine/indomethacin   Acute medial meniscal tear 12/15/2014   Angina  pectoris ([place]) 05/05/2018   Aortic regurgitation 07/07/2018   Aortic stenosis, mild 07/07/2018   Arthritis    LEFT SHOULDER   Asthma    pt unsure   Benign hypertension 11/27/2015   Bicuspid aortic valve 05/22/2018   CAD in native artery 12/02/8117   Complication of anesthesia    " during a knee surgery , my blood pressure was up & stayed up "   Coronary artery disease    Coronary artery disease of native artery of native heart with stable angina pectoris ([place]) 11/09/2018    Enlarged thoracic aorta ([place]) 05/22/2018   External hemorrhoid, thrombosed 12/28/2015   Overview:  2017:    Fatty liver    Heart murmur    History of adenomatous polyp of colon    History of kidney stones    Hyperlipidemia    Hypertension    Lower respiratory infection 02/08/2016   Mixed hyperlipidemia 11/27/2015   Obstructive sleep apnea 11/27/2015   OSA on CPAP    uses cpap   Recurrent idiopathic pericarditis 12/23/2017   Right knee meniscal tear    Screening for diabetes mellitus (DM) 11/27/2015   Type 2 diabetes mellitus without complication, without long-term current use of insulin ([place]) 11/27/2015   Formatting of this note might be different from the original. 2019: 107/6.4 2020: 5.9 2020: 130/7.7   Wears glasses     Past Surgical History:  Procedure Laterality Date   APPENDECTOMY  age 67   CARDIAC CATHETERIZATION  11/11/2018   CHONDROPLASTY Right 12/15/2014   Procedure: CHONDROPLASTY;  Surgeon: Gaynelle Arabian, MD;  Location: [facility];  Service: Orthopedics;  Laterality: Right;   COLONOSCOPY W/ POLYPECTOMY  2011   CORONARY STENT INTERVENTION  11/11/2018   CORONARY STENT INTERVENTION N/A 11/11/2018   Procedure: CORONARY STENT INTERVENTION;  Surgeon: Troy Sine, MD;  Location: [facility];  Service: Cardiovascular;  Laterality: N/A;   CYSTO/  URETEROSCOPIC STONE EXTRACTIONS  1995   KNEE ARTHROSCOPY WITH LATERAL MENISECTOMY Right 12/15/2014   Procedure: KNEE ARTHROSCOPY WITH LATERAL MENISECTOMY;  Surgeon: Gaynelle Arabian, MD;  Location: [facility];  Service: Orthopedics;  Laterality: Right;   KNEE ARTHROSCOPY WITH MEDIAL MENISECTOMY Right 12/15/2014   Procedure: KNEE ARTHROSCOPY WITH MEDIAL MENISECTOMY;  Surgeon: Gaynelle Arabian, MD;  Location: [facility];  Service: Orthopedics;  Laterality: Right;   LEFT HEART CATH AND CORONARY ANGIOGRAPHY N/A 11/11/2018   Procedure: LEFT HEART CATH AND CORONARY ANGIOGRAPHY;  Surgeon: Troy Sine,  MD;  Location: [facility];  Service: Cardiovascular;  Laterality: N/A;   NASAL SEPTUM SURGERY  1995   REVERSE SHOULDER ARTHROPLASTY Left 11/24/2020   Procedure: REVERSE SHOULDER ARTHROPLASTY;  Surgeon: Netta Cedars, MD;  Location: [facility];  Service: Orthopedics;  Laterality: Left;  interscalene block   TOTAL KNEE ARTHROPLASTY Right     Review of systems negative except as noted in HPI / PMHx or noted below:  Review of Systems  Constitutional: Negative.   HENT: Negative.    Eyes: Negative.   Respiratory: Negative.    Cardiovascular: Negative.   Gastrointestinal: Negative.   Genitourinary: Negative.   Musculoskeletal: Negative.   Skin: Negative.   Neurological: Negative.   Endo/Heme/Allergies: Negative.   Psychiatric/Behavioral: Negative.      Objective:   Vitals:   08/09/21 1120  BP: 134/84  Pulse: 84  Resp: 16  SpO2: 94%          Physical Exam Constitutional:  Appearance: He is not diaphoretic.  HENT:     Head: Normocephalic.     Right Ear: Tympanic membrane, ear canal and external ear normal.     Left Ear: Tympanic membrane, ear canal and external ear normal.     Nose: Nose normal. No mucosal edema or rhinorrhea.     Mouth/Throat:     Pharynx: Uvula midline. No oropharyngeal exudate.  Eyes:     Conjunctiva/sclera: Conjunctivae normal.  Neck:     Thyroid: No thyromegaly.     Trachea: Trachea normal. No tracheal tenderness or tracheal deviation.  Cardiovascular:     Rate and Rhythm: Normal rate and regular rhythm.     Heart sounds: Normal heart sounds, S1 normal and S2 normal. No murmur heard. Pulmonary:     Effort: No respiratory distress.     Breath sounds: Normal breath sounds. No stridor. No wheezing or rales.  Lymphadenopathy:     Head:     Right side of head: No tonsillar adenopathy.     Left side of head: No tonsillar adenopathy.     Cervical: No cervical adenopathy.  Skin:    Findings: No erythema or rash.     Nails: There is no  clubbing.  Neurological:     Mental Status: He is alert.    Diagnostics:   Assessment and Plan:   1. Allergy with anaphylaxis due to food   2. Recurrent idiopathic pericarditis     1.  Allergen avoidance measures -mammal consumption  2.  EpiPen, Benadryl, MD/ER evaluation for allergic reaction  3.  Consider utilizing Ranolacept on a consistent basis 1 time per week or 1 time every other week.  4.  Return to clinic in 1 year or earlier if problem.  Adyen is going to continue to avoid mammal consumption at this point in time given his alpha gal syndrome.  He is not that interested in checking titers directed against alpha gal as his lifestyle is changed significantly in he and his family do not consume any mammal.  We did renew his EpiPen today.  He has had a significant problem with recurrent pericarditis and while using his anti-IL-1 receptor antagonist he had absolutely no episodes of pericarditis requiring systemic steroids or colchicine and within a few months of discontinuing this agent he has had a significant flare of his pericarditis.  I have asked him to speak with Dr. Bettina Gavia, cardiology, about using his anti-IL-1 receptor antagonist on a consistent basis either 1 time per week or 1 time every other week to prevent him from developing significant episodes of pericarditis requiring him to use high-dose systemic steroids.  Allena Katz, MD Allergy / Immunology [place]

## 2021-08-09 NOTE — Patient Instructions (Addendum)
°  1.  Allergen avoidance measures -mammal consumption  2.  EpiPen, Benadryl, MD/ER evaluation for allergic reaction  3.  Consider utilizing Ranolacept on a consistent basis 1 time per week or 1 time every other week.  4.  Return to clinic in 1 year or earlier if problem.

## 2021-08-12 ENCOUNTER — Other Ambulatory Visit: Payer: Self-pay | Admitting: Cardiology

## 2021-08-16 ENCOUNTER — Encounter: Payer: Self-pay | Admitting: Cardiology

## 2021-09-03 ENCOUNTER — Encounter: Payer: Self-pay | Admitting: Cardiology

## 2021-09-03 DIAGNOSIS — L739 Follicular disorder, unspecified: Secondary | ICD-10-CM

## 2021-09-03 DIAGNOSIS — I300 Acute nonspecific idiopathic pericarditis: Secondary | ICD-10-CM

## 2021-09-03 HISTORY — DX: Follicular disorder, unspecified: L73.9

## 2021-09-06 DIAGNOSIS — Z955 Presence of coronary angioplasty implant and graft: Secondary | ICD-10-CM | POA: Insufficient documentation

## 2021-09-06 HISTORY — DX: Presence of coronary angioplasty implant and graft: Z95.5

## 2021-09-11 MED ORDER — PREDNISONE 20 MG PO TABS: 40.0000 mg | ORAL_TABLET | Freq: Every day | ORAL | 0 refills | Status: DC

## 2021-09-12 LAB — C-REACTIVE PROTEIN: CRP: 7 mg/L (ref 0–10)

## 2021-09-12 LAB — SEDIMENTATION RATE: Sed Rate: 49 mm/hr — ABNORMAL HIGH (ref 0–30)

## 2021-09-18 NOTE — Addendum Note (Signed)
Addended by: Resa Miner I on: 09/18/2021 10:56 AM   Modules accepted: Orders

## 2021-09-21 LAB — CBC
Hematocrit: 48.5 % (ref 37.5–51.0)
Hemoglobin: 16.2 g/dL (ref 13.0–17.7)
MCH: 29.8 pg (ref 26.6–33.0)
MCHC: 33.4 g/dL (ref 31.5–35.7)
MCV: 89 fL (ref 79–97)
Platelets: 245 10*3/uL (ref 150–450)
RBC: 5.44 x10E6/uL (ref 4.14–5.80)
RDW: 14.8 % (ref 11.6–15.4)
WBC: 7.5 10*3/uL (ref 3.4–10.8)

## 2021-09-21 LAB — HIGH SENSITIVITY CRP: CRP, High Sensitivity: 0.41 mg/L (ref 0.00–3.00)

## 2021-09-21 LAB — SEDIMENTATION RATE: Sed Rate: 14 mm/hr (ref 0–30)

## 2021-09-24 MED ORDER — PREDNISONE 20 MG PO TABS: 10.0000 mg | ORAL_TABLET | Freq: Every day | ORAL | 0 refills | Status: DC

## 2021-09-24 MED ORDER — CLOPIDOGREL BISULFATE 75 MG PO TABS: 75.0000 mg | ORAL_TABLET | Freq: Every day | ORAL | 3 refills | Status: DC

## 2021-09-24 NOTE — Addendum Note (Signed)
Addended by: Eleonore Chiquito on: 09/24/2021 01:11 PM   Modules accepted: Orders

## 2021-11-07 ENCOUNTER — Encounter: Payer: Self-pay | Admitting: Cardiology

## 2021-11-08 MED ORDER — NITROGLYCERIN 0.4 MG SL SUBL: 0.4000 mg | SUBLINGUAL_TABLET | SUBLINGUAL | 6 refills | Status: AC | PRN

## 2021-11-14 ENCOUNTER — Encounter: Payer: Self-pay | Admitting: Cardiology

## 2021-11-14 ENCOUNTER — Ambulatory Visit: Payer: Medicare PPO | Admitting: Cardiology

## 2021-11-14 VITALS — BP 122/88 | HR 74 | Ht 71.0 in | Wt 198.0 lb

## 2021-11-14 DIAGNOSIS — E782 Mixed hyperlipidemia: Secondary | ICD-10-CM

## 2021-11-14 DIAGNOSIS — I300 Acute nonspecific idiopathic pericarditis: Secondary | ICD-10-CM | POA: Diagnosis not present

## 2021-11-14 DIAGNOSIS — E119 Type 2 diabetes mellitus without complications: Secondary | ICD-10-CM

## 2021-11-14 DIAGNOSIS — I10 Essential (primary) hypertension: Secondary | ICD-10-CM | POA: Diagnosis not present

## 2021-11-14 DIAGNOSIS — I351 Nonrheumatic aortic (valve) insufficiency: Secondary | ICD-10-CM

## 2021-11-14 DIAGNOSIS — I25118 Atherosclerotic heart disease of native coronary artery with other forms of angina pectoris: Secondary | ICD-10-CM | POA: Diagnosis not present

## 2021-11-14 MED ORDER — CLOPIDOGREL BISULFATE 75 MG PO TABS: 75.0000 mg | ORAL_TABLET | Freq: Every day | ORAL | 3 refills | Status: DC

## 2021-11-14 NOTE — Progress Notes (Signed)
?Cardiology Office Note:   ? ?Date:  11/14/2021  ? ?ID:  Eugene Taylor, DOB 08-Aug-1951, MRN [id number] ? ?PCP:  Algis Greenhouse, MD  ?Cardiologist:  Shirlee More, MD   ? ?Referring MD: Algis Greenhouse, MD  ? ? ?ASSESSMENT:   ? ?1. Recurrent idiopathic pericarditis   ?2. Coronary artery disease of native artery of native heart with stable angina pectoris ([place])   ?3. Essential hypertension   ?4. Type 2 diabetes mellitus without complication, without long-term current use of insulin ([place])   ?5. Mixed hyperlipidemia   ?6. Nonrheumatic aortic valve insufficiency   ? ?PLAN:   ? ?In order of problems listed above: ? ?He is improved he remains on ralonacept.  I am somewhat disappointed by the referral to  [facility] I was looking for someone with a large personal experience with recurrent persistent or chronic pericarditis.  I will reach out to Dr. Neldon Mc and see if he has an opinion about frequency and duration of his current treatment.  Recheck his inflammatory markers ?Stable CAD having no anginal discomfort, interesting right after risk/importance I do could stop the clopidogrel large study is relationally is more effective than aspirin monotherapy will go back to clopidogrel and stop his aspirin.  Having no angina ?Stable BP at target continue current treatment with ARB ?Stable diabetes ?Lipids at target continue his high intensity statin ?Mild aortic regurgitation ? ? ?Next appointment: 3 months ? ? ?Medication Adjustments/Labs and Tests Ordered: ?Current medicines are reviewed at length with the patient today.  Concerns regarding medicines are outlined above.  ?No orders of the defined types were placed in this encounter. ? ?No orders of the defined types were placed in this encounter. ? ? ?Chief Complaint  ?Patient presents with  ? Follow-up  ?  pericarditis  ? ? ?History of Present Illness:   ? ?Eugene Taylor is a 71 y.o. male with a hx of recurrent idiopathic pericarditis failing treatment with colchicine nonsteroidal  inflammatory drugs and steroids treated with immune modulating medication ranolacept completing 1 year of treatment 04/09/2021.Marland KitchenOther cardiac problems include CAD with PCI and stent proximal left circumflex 11/11/2018, mild aortic stenosis and regurgitation, mild enlargement ascending aorta, hypertensive heart disease and hyper lipidemia .  He was last seen 05/09/2021 and was sent to [facility] for comprehensive assessment for his resistant recurrent pericarditis ?Subsequently restarted on immune modulating therapy with ralonacept.  Unsure why but a great deal of effort was placed into stopping clopidogrel and keeping him on aspirin and subsequent trials show clopidogrel to be a better path for patients with previous PCI he subsequently has been withdrawn from prednisone his inflammatory markers sedimentation rate and CRP have normalized. ? ?Compliance with diet, lifestyle and medications: Yes ? ?He has had no recurrent pericardial pain or angina.  After vigorous gardening work he had pain in the left shoulder over a site of total arthroplasty relieved with ibuprofen ?He feels well no edema shortness of breath palpitation or syncope ?Past Medical History:  ?Diagnosis Date  ? Abnormal cardiac CT angiography   ? Acute idiopathic pericarditis 04/25/2016  ? Overview:  2017: hosp, EKG changes, colchicine/indomethacin  ? Acute medial meniscal tear 12/15/2014  ? Angina pectoris ([place]) 05/05/2018  ? Aortic regurgitation 07/07/2018  ? Aortic stenosis, mild 07/07/2018  ? Arthritis   ? LEFT SHOULDER  ? Asthma   ? pt unsure  ? Benign hypertension 11/27/2015  ? Bicuspid aortic valve 05/22/2018  ? CAD in native artery 11/11/2018  ? Complication  of anesthesia   ? " during a knee surgery , my blood pressure was up & stayed up "  ? Coronary artery disease   ? Coronary artery disease of native artery of native heart with stable angina pectoris ([place]) 11/09/2018  ? Enlarged thoracic aorta ([place]) 05/22/2018  ? External hemorrhoid, thrombosed 12/28/2015  ?  Overview:  2017:   ? Fatty liver   ? Heart murmur   ? History of adenomatous polyp of colon   ? History of kidney stones   ? Hyperlipidemia   ? Hypertension   ? Lower respiratory infection 02/08/2016  ? Mixed hyperlipidemia 11/27/2015  ? Obstructive sleep apnea 11/27/2015  ? OSA on CPAP   ? uses cpap  ? Recurrent idiopathic pericarditis 12/23/2017  ? Right knee meniscal tear   ? Screening for diabetes mellitus (DM) 11/27/2015  ? Type 2 diabetes mellitus without complication, without long-term current use of insulin ([place]) 11/27/2015  ? Formatting of this note might be different from the original. 2019: 107/6.4 2020: 5.9 2020: 130/7.7  ? Wears glasses   ? ? ?Past Surgical History:  ?Procedure Laterality Date  ? APPENDECTOMY  age 81  ? CARDIAC CATHETERIZATION  11/11/2018  ? CHONDROPLASTY Right 12/15/2014  ? Procedure: CHONDROPLASTY;  Surgeon: Gaynelle Arabian, MD;  Location: [facility];  Service: Orthopedics;  Laterality: Right;  ? COLONOSCOPY W/ POLYPECTOMY  2011  ? CORONARY STENT INTERVENTION  11/11/2018  ? CORONARY STENT INTERVENTION N/A 11/11/2018  ? Procedure: CORONARY STENT INTERVENTION;  Surgeon: Troy Sine, MD;  Location: [facility];  Service: Cardiovascular;  Laterality: N/A;  ? CYSTO/  URETEROSCOPIC Welcome  ? KNEE ARTHROSCOPY WITH LATERAL MENISECTOMY Right 12/15/2014  ? Procedure: KNEE ARTHROSCOPY WITH LATERAL MENISECTOMY;  Surgeon: Gaynelle Arabian, MD;  Location: [facility];  Service: Orthopedics;  Laterality: Right;  ? KNEE ARTHROSCOPY WITH MEDIAL MENISECTOMY Right 12/15/2014  ? Procedure: KNEE ARTHROSCOPY WITH MEDIAL MENISECTOMY;  Surgeon: Gaynelle Arabian, MD;  Location: [facility];  Service: Orthopedics;  Laterality: Right;  ? LEFT HEART CATH AND CORONARY ANGIOGRAPHY N/A 11/11/2018  ? Procedure: LEFT HEART CATH AND CORONARY ANGIOGRAPHY;  Surgeon: Troy Sine, MD;  Location: [facility];  Service: Cardiovascular;  Laterality: N/A;  ? NASAL  SEPTUM SURGERY  1995  ? REVERSE SHOULDER ARTHROPLASTY Left 11/24/2020  ? Procedure: REVERSE SHOULDER ARTHROPLASTY;  Surgeon: Netta Cedars, MD;  Location: [facility];  Service: Orthopedics;  Laterality: Left;  interscalene block  ? TOTAL KNEE ARTHROPLASTY Right   ? ? ?Current Medications: ?Current Meds  ?Medication Sig  ? acetaminophen (TYLENOL) 500 MG tablet Take 500 mg by mouth daily as needed (before golfing.).  ? aspirin EC 81 MG tablet Take 81 mg by mouth daily. Swallow whole.  ? b complex vitamins tablet Take 1 tablet by mouth daily.  ? cholecalciferol (VITAMIN D3) 25 MCG (1000 UT) tablet Take 1,000 Units by mouth daily.  ? clindamycin (CLEOCIN) 300 MG capsule TAKE 2 CAPSULES BY MOUTH 45 MINUTES PRIOR TO DENTAL VISIT  ? Colchicine (MITIGARE) 0.6 MG CAPS Take 0.6 mg by mouth 2 (two) times daily.  ? EPINEPHrine 0.3 mg/0.3 mL IJ SOAJ injection Use as directed for life-threatening allergic reaction. (Patient taking differently: Inject 0.3 mg into the muscle as needed for anaphylaxis (meat allergy (alpha gal)).)  ? glucose monitoring kit (FREESTYLE) monitoring kit 1 each by Does not apply route as needed for other.  ? JARDIANCE 25 MG TABS tablet  Take 25 mg by mouth daily.  ? nitroGLYCERIN (NITROSTAT) 0.4 MG SL tablet Place 1 tablet (0.4 mg total) under the tongue every 5 (five) minutes as needed for chest pain.  ? predniSONE (DELTASONE) 20 MG tablet Take 0.5 tablets (10 mg total) by mouth daily with breakfast.  ? rosuvastatin (CRESTOR) 20 MG tablet Take 1 tablet (20 mg total) by mouth daily.  ? telmisartan (MICARDIS) 20 MG tablet Take 1 tablet (20 mg total) by mouth daily.  ?  ? ?Allergies:   Other, Penicillins, and Hydrocodeine [dihydrocodeine]  ? ?Social History  ? ?Socioeconomic History  ? Marital status: Married  ?  Spouse name: Not on file  ? Number of children: Not on file  ? Years of education: Not on file  ? Highest education level: Not on file  ?Occupational History  ? Not on file  ?Tobacco Use  ? Smoking  status: Never  ?  Passive exposure: Never  ? Smokeless tobacco: Never  ?Vaping Use  ? Vaping Use: Never used  ?Substance and Sexual Activity  ? Alcohol use: No  ? Drug use: No  ? Sexual activity: Not on fi

## 2021-11-14 NOTE — Patient Instructions (Signed)
Medication Instructions:  ?Your physician has recommended you make the following change in your medication: START CLOPIDOGREL 75 MG  BY MOUTH ONCE DAILY  ?STOP ASPIRIN ?*If you need a refill on your cardiac medications before your next appointment, please call your pharmacy* ? ? ?Lab Work: ?CMP, CBC, SED RATE, CRP ? ?If you have labs (blood work) drawn today and your tests are completely normal, you will receive your results only by: ?MyChart Message (if you have MyChart) OR ?A paper copy in the mail ?If you have any lab test that is abnormal or we need to change your treatment, we will call you to review the results. ? ? ?Testing/Procedures: ?NONE ? ? ?Follow-Up: ?At [facility], you and your health needs are our priority.  As part of our continuing mission to provide you with exceptional heart care, we have created designated Provider Care Teams.  These Care Teams include your primary Cardiologist (physician) and Advanced Practice Providers (APPs -  Physician Assistants and Nurse Practitioners) who all work together to provide you with the care you need, when you need it. ? ?We recommend signing up for the patient portal called "MyChart".  Sign up information is provided on this After Visit Summary.  MyChart is used to connect with patients for Virtual Visits (Telemedicine).  Patients are able to view lab/test results, encounter notes, upcoming appointments, etc.  Non-urgent messages can be sent to your provider as well.   ?To learn more about what you can do with MyChart, go to [email or web address].   ? ?Your next appointment:   ?3 month(s) ? ?The format for your next appointment:   ?In Person ? ?Provider:   ?[name], MD  ? ? ?Other Instructions ?NONE ?

## 2021-11-14 NOTE — Addendum Note (Signed)
Addended by: Smiley Houseman B on: 11/14/2021 09:13 AM ? ? Modules accepted: Orders ? ?

## 2021-11-15 LAB — CBC
Hematocrit: 47.3 % (ref 37.5–51.0)
Hemoglobin: 16.3 g/dL (ref 13.0–17.7)
MCH: 30.5 pg (ref 26.6–33.0)
MCHC: 34.5 g/dL (ref 31.5–35.7)
MCV: 89 fL (ref 79–97)
Platelets: 275 10*3/uL (ref 150–450)
RBC: 5.34 x10E6/uL (ref 4.14–5.80)
RDW: 14.2 % (ref 11.6–15.4)
WBC: 4.4 10*3/uL (ref 3.4–10.8)

## 2021-11-15 LAB — COMPREHENSIVE METABOLIC PANEL
ALT: 19 IU/L (ref 0–44)
AST: 20 IU/L (ref 0–40)
Albumin/Globulin Ratio: 1.8 (ref 1.2–2.2)
Albumin: 4.5 g/dL (ref 3.8–4.8)
Alkaline Phosphatase: 69 IU/L (ref 44–121)
BUN/Creatinine Ratio: 18 (ref 10–24)
BUN: 18 mg/dL (ref 8–27)
Bilirubin Total: 0.6 mg/dL (ref 0.0–1.2)
CO2: 24 mmol/L (ref 20–29)
Calcium: 9.6 mg/dL (ref 8.6–10.2)
Chloride: 106 mmol/L (ref 96–106)
Creatinine, Ser: 1 mg/dL (ref 0.76–1.27)
Globulin, Total: 2.5 g/dL (ref 1.5–4.5)
Glucose: 86 mg/dL (ref 70–99)
Potassium: 4.3 mmol/L (ref 3.5–5.2)
Sodium: 144 mmol/L (ref 134–144)
Total Protein: 7 g/dL (ref 6.0–8.5)
eGFR: 82 mL/min/{1.73_m2} (ref >59)

## 2021-11-15 LAB — C-REACTIVE PROTEIN: CRP: 2 mg/L (ref 0–10)

## 2021-11-15 LAB — SEDIMENTATION RATE: Sed Rate: 28 mm/hr (ref 0–30)

## 2021-11-17 ENCOUNTER — Other Ambulatory Visit: Payer: Self-pay | Admitting: Cardiology

## 2021-12-09 ENCOUNTER — Other Ambulatory Visit: Payer: Self-pay | Admitting: Cardiology

## 2022-01-06 IMAGING — CT CT ABDOMEN W/O CM
2 of 4 series · 16 of 46 positions shown, 18 images · non-contrast
Comparison: CTA abdomen/pelvis dated 04/22/2016

CLINICAL DATA: Follow-up bilateral renal cysts

EXAM:
CT ABDOMEN WITHOUT CONTRAST
TECHNIQUE: Multidetector CT imaging of the abdomen was performed following the
standard protocol without IV contrast.

[Series 2: axial st · axial · 0.80mm/px · z∈[-348,-102]mm · 13 of 55 slices shown, 15 images]
[im 3/55  soft-tissue]
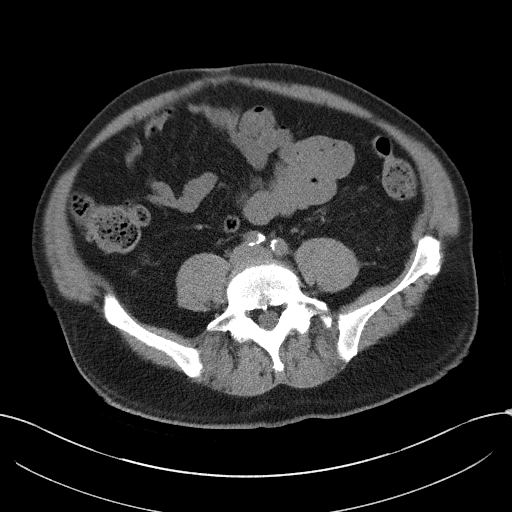
[im 3/55  bone]
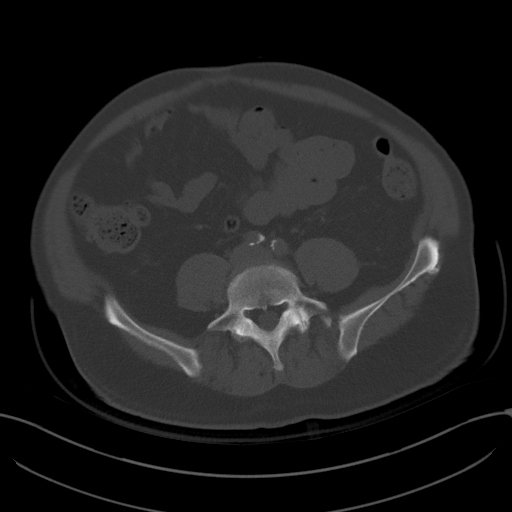
[im 8/55  soft-tissue]
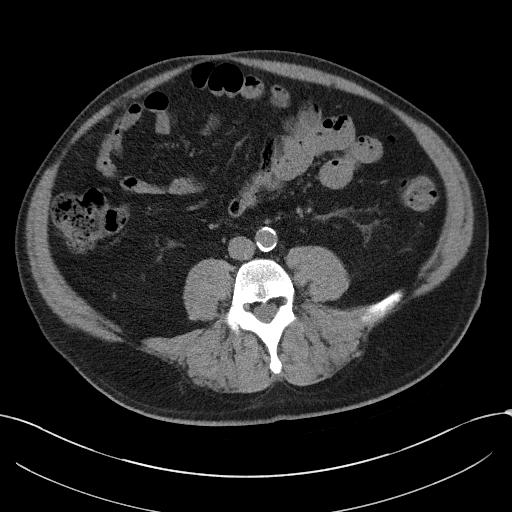
[im 13/55  soft-tissue]
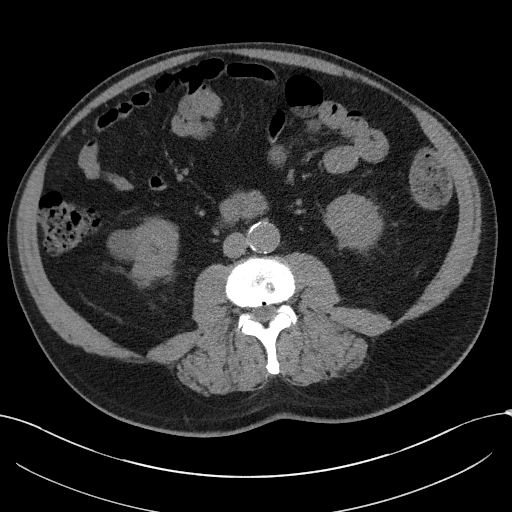
[im 15/55  soft-tissue]
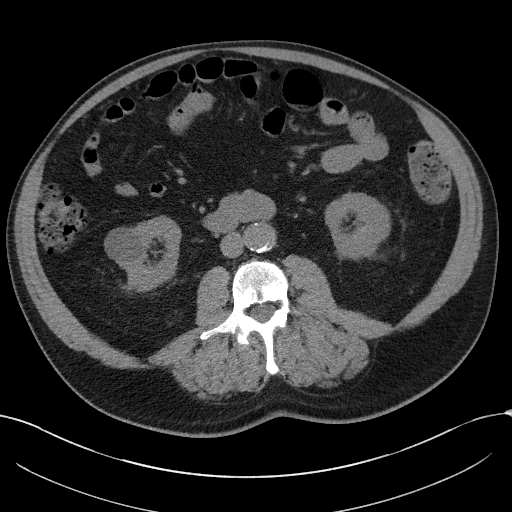
[im 20/55  soft-tissue]
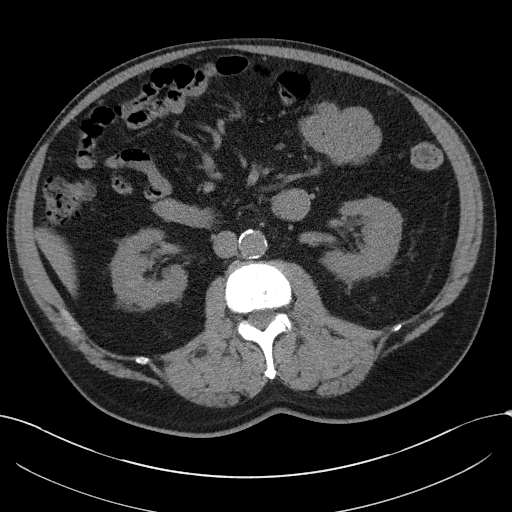
[im 23/55  soft-tissue]
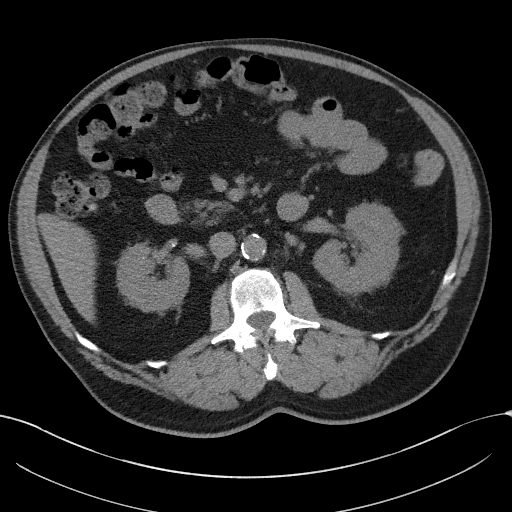
[im 28/55  soft-tissue]
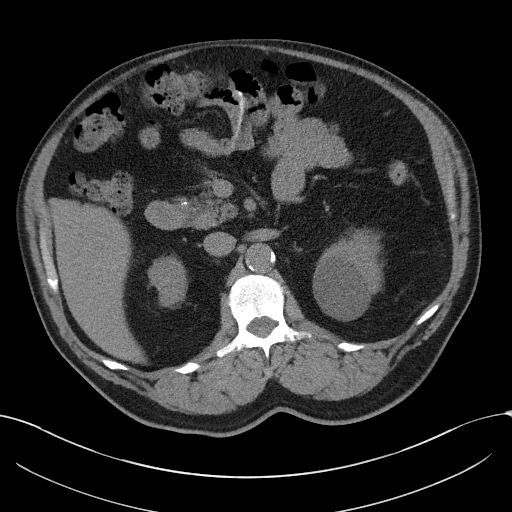
[im 32/55  soft-tissue]
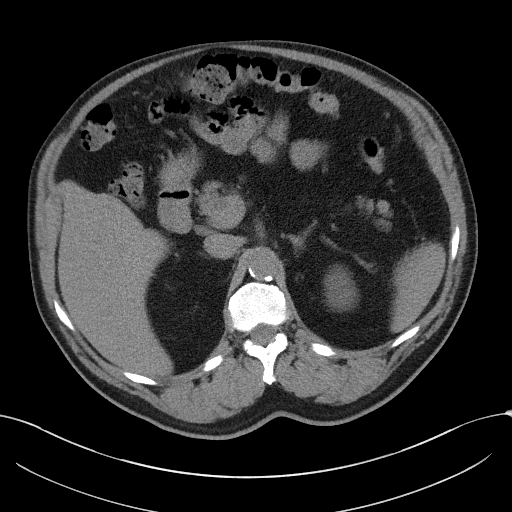
[im 35/55  soft-tissue]
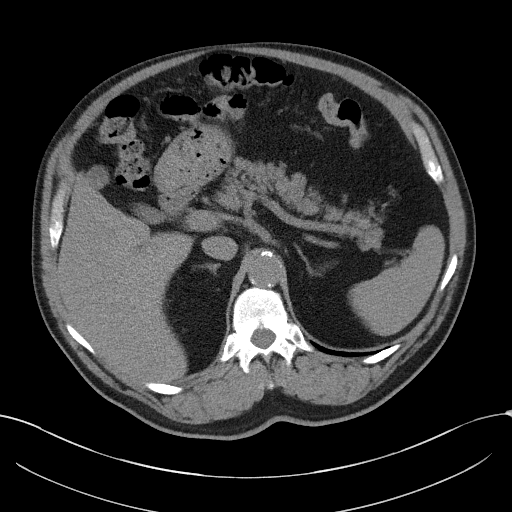
[im 35/55  bone]
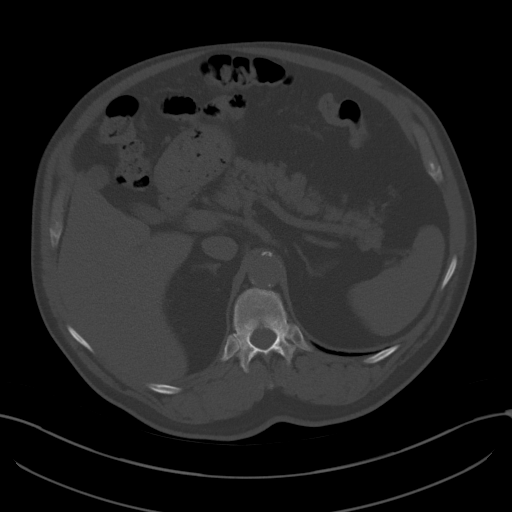
[im 40/55  soft-tissue]
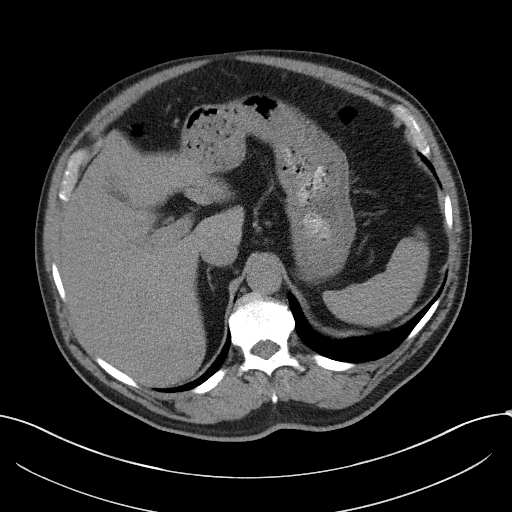
[im 42/55  soft-tissue]
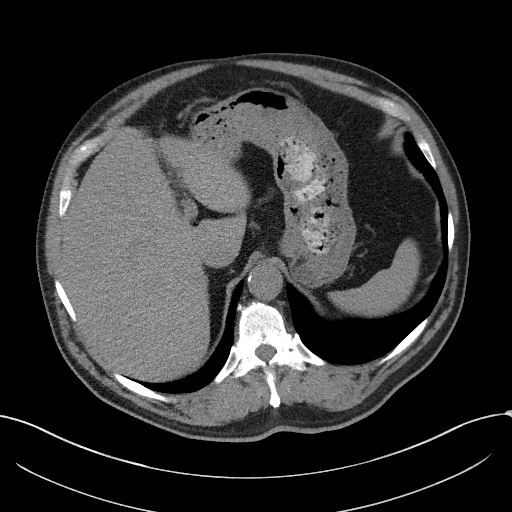
[im 47/55  soft-tissue]
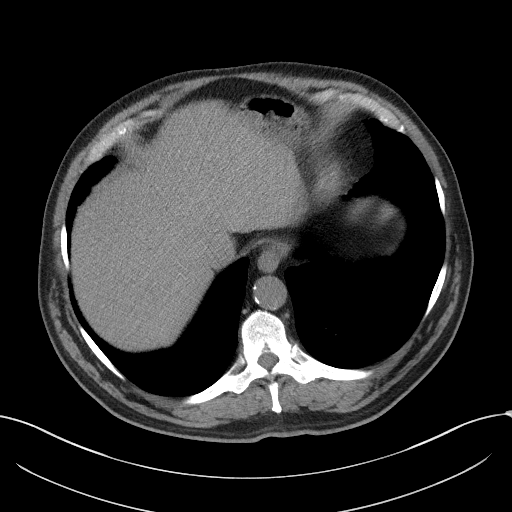
[im 52/55  soft-tissue]
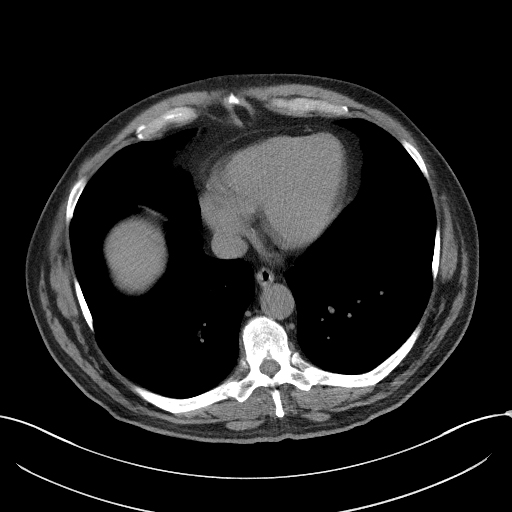

[Series 5: coronal st · coronal · 0.56mm/px · 3 of 113 slices shown]
[im 38/113  soft-tissue]
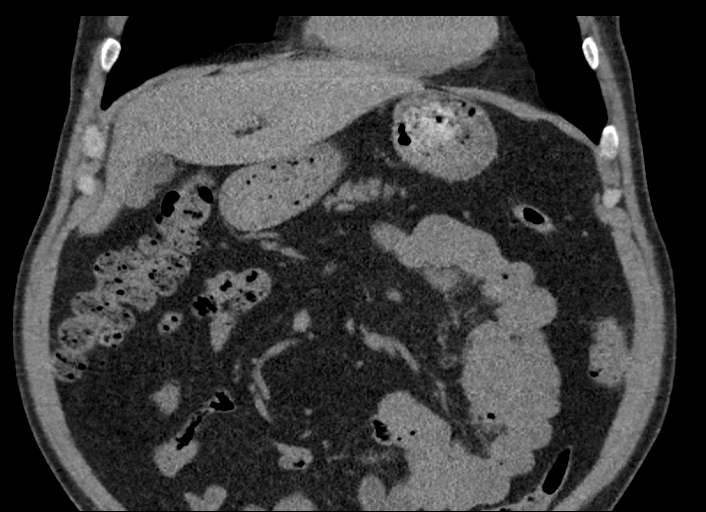
[im 50/113  soft-tissue]
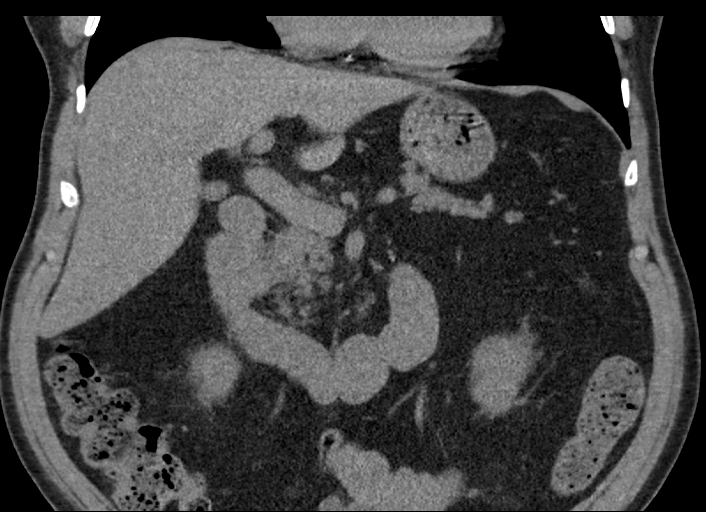
[im 63/113  soft-tissue]
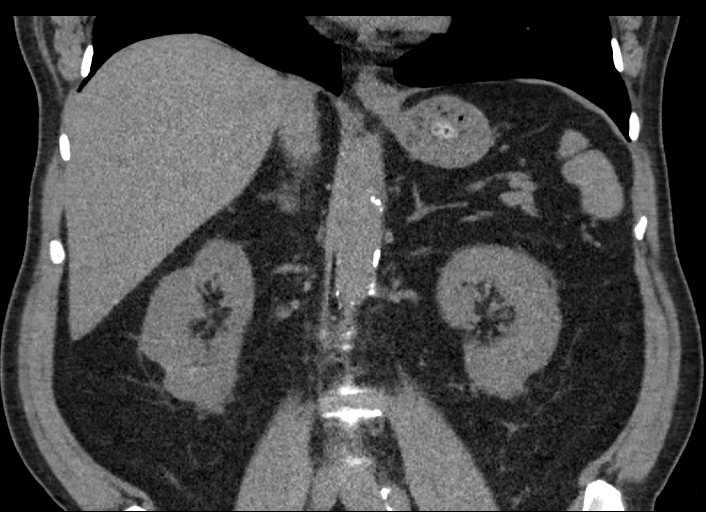

[16 of 46 positions shown; findings below may reference images not displayed]

FINDINGS: Lower chest: Lung bases are clear.

Hepatobiliary: Unenhanced liver is unremarkable.

Gallbladder is unremarkable. No intrahepatic or extrahepatic ductal
dilatation.

Pancreas: Within normal limits.

Spleen: Within normal limits.

Adrenals/Urinary Tract: Adrenal glands are within normal limits.

5.3 cm medial left upper pole renal cyst (series 2/image 28),
previously 4.3 cm. 2.7 cm lateral right lower pole renal cyst
(series 2/image 43), unchanged. While these are technically in
comparison the characterized in the absence of intravenous contrast,
both measures simple fluid density on unenhanced CT.

2 mm nonobstructing right upper pole renal calculus (series 2/image
34). No hydronephrosis.

Stomach/Bowel: Stomach is within normal limits.

Visualized bowel is unremarkable.

Vascular/Lymphatic: No evidence of abdominal aortic aneurysm.

Atherosclerotic calcifications the abdominal aorta and branch
vessels.

No suspicious abdominal lymphadenopathy.

Other: No abdominal ascites.

Musculoskeletal: Mild degenerative changes of the visualized
thoracolumbar spine, most prominent at L5-S1.
IMPRESSION: 5.3 cm left upper pole renal cyst and 2.7 cm right lower pole renal
cyst, measuring simple fluid density on unenhanced CT, likely
benign.

2 mm nonobstructing right upper pole renal calculus. No
hydronephrosis.

## 2022-01-11 ENCOUNTER — Other Ambulatory Visit: Payer: Self-pay | Admitting: Cardiology

## 2022-01-27 ENCOUNTER — Other Ambulatory Visit: Payer: Self-pay | Admitting: Cardiology

## 2022-01-28 ENCOUNTER — Other Ambulatory Visit: Payer: Self-pay | Admitting: Cardiology

## 2022-01-28 NOTE — Telephone Encounter (Signed)
Rx refill sent to pharmacy. 

## 2022-02-12 NOTE — Progress Notes (Unsigned)
Cardiology Office Note:    Date:  02/13/2022   ID:  Eugene Taylor, DOB November 01, 1950, MRN [id number]  PCP:  Algis Greenhouse, MD  Cardiologist:  Shirlee More, MD    Referring MD: Algis Greenhouse, MD    ASSESSMENT:    1. Recurrent idiopathic pericarditis   2. Coronary artery disease of native artery of native heart with stable angina pectoris ([place])   3. Essential hypertension   4. Mixed hyperlipidemia   5. Type 2 diabetes mellitus without complication, without long-term current use of insulin ([place])   6. Nonrheumatic aortic valve insufficiency    PLAN:    In order of problems listed above:  He continues in remission recheck his inflammatory markers sedimentation rate CRP as well as a baseline CBC and I will ask Dr. Neldon Mc allergy for an opinion regarding duration and frequency of treatment.  And has not to stop his ralonacept With previous recurrence Stable CAD no anginal discomfort continue his single antiplatelet clindamycin along with lipid-lowering having no angina following PCI I am unsure of the validity of a single office BP measurement he will start to record his blood pressure trend at home and bring them with him next visit and continue ARB Continue his high intensity statin we will check a lipid profile today Stable managed by his PCP on good cardioprotective therapy SGLT2 inhibitor Stable mild valvular heart disease   Next appointment: 4 months   Medication Adjustments/Labs and Tests Ordered: Current medicines are reviewed at length with the patient today.  Concerns regarding medicines are outlined above.  Orders Placed This Encounter  Procedures   Comp Met (CMET)   Lipid Profile   CBC   Sedimentation rate   C-reactive protein   No orders of the defined types were placed in this encounter.   Chief Complaint  Patient presents with   Follow-up    For recurrent pericarditis     History of Present Illness:    Eugene Taylor is a 71 y.o. male with a hx of  complex heart disease including recurrent idiopathic pericarditis failing treatment with colchicine and nonsteroidal anti-inflammatory drug and corticosteroids responding to immune modulation with ranolacept recurring therapy requiring reinitiation, CAD with PCI and stent proximal left circumflex 11/11/2018, mild aortic stenosis and regurgitation, mild enlargement of ascending aorta hypertensive heart disease without heart failure and hyper hyperlipidemia.  He was last seen 11/14/2021. Compliance with diet, lifestyle and medications: 11/14/2021 improved his sedimentation rate normal CBC normal C-reactive protein normal. Cardiac MR performed 08/13/2019 with mild delay pericardial enhancement consistent with chronic pericarditis  He continues to do well now fully retired finds time for golf and activities and has no cardiovascular symptoms of exercise intolerance edema orthopnea shortness of breath chest pain palpitation or syncope He continues on his anti-inflammatory agent weekly injections Past Medical History:  Diagnosis Date   Abnormal cardiac CT angiography    Acute idiopathic pericarditis 04/25/2016   Overview:  2017: hosp, EKG changes, colchicine/indomethacin   Acute medial meniscal tear 12/15/2014   Angina pectoris ([place]) 05/05/2018   Aortic regurgitation 07/07/2018   Aortic stenosis, mild 07/07/2018   Arthritis    LEFT SHOULDER   Asthma    pt unsure   Benign hypertension 11/27/2015   Bicuspid aortic valve 05/22/2018   CAD in native artery 6/56/8127   Complication of anesthesia    " during a knee surgery , my blood pressure was up & stayed up "   Coronary artery disease  Coronary artery disease of native artery of native heart with stable angina pectoris ([place]) 11/09/2018   Enlarged thoracic aorta ([place]) 05/22/2018   External hemorrhoid, thrombosed 12/28/2015   Overview:  2017:    Fatty liver    Heart murmur    History of adenomatous polyp of colon    History of kidney stones     Hyperlipidemia    Hypertension    Lower respiratory infection 02/08/2016   Mixed hyperlipidemia 11/27/2015   Obstructive sleep apnea 11/27/2015   OSA on CPAP    uses cpap   Recurrent idiopathic pericarditis 12/23/2017   Right knee meniscal tear    Screening for diabetes mellitus (DM) 11/27/2015   Type 2 diabetes mellitus without complication, without long-term current use of insulin ([place]) 11/27/2015   Formatting of this note might be different from the original. 2019: 107/6.4 2020: 5.9 2020: 130/7.7   Wears glasses     Past Surgical History:  Procedure Laterality Date   APPENDECTOMY  age 73   CARDIAC CATHETERIZATION  11/11/2018   CHONDROPLASTY Right 12/15/2014   Procedure: CHONDROPLASTY;  Surgeon: Gaynelle Arabian, MD;  Location: [facility];  Service: Orthopedics;  Laterality: Right;   COLONOSCOPY W/ POLYPECTOMY  2011   CORONARY STENT INTERVENTION  11/11/2018   CORONARY STENT INTERVENTION N/A 11/11/2018   Procedure: CORONARY STENT INTERVENTION;  Surgeon: Troy Sine, MD;  Location: [place] [facility];  Service: Cardiovascular;  Laterality: N/A;   CYSTO/  URETEROSCOPIC STONE EXTRACTIONS  1995   KNEE ARTHROSCOPY WITH LATERAL MENISECTOMY Right 12/15/2014   Procedure: KNEE ARTHROSCOPY WITH LATERAL MENISECTOMY;  Surgeon: Gaynelle Arabian, MD;  Location: [place];  Service: Orthopedics;  Laterality: Right;   KNEE ARTHROSCOPY WITH MEDIAL MENISECTOMY Right 12/15/2014   Procedure: KNEE ARTHROSCOPY WITH MEDIAL MENISECTOMY;  Surgeon: Gaynelle Arabian, MD;  Location: [facility];  Service: Orthopedics;  Laterality: Right;   LEFT HEART CATH AND CORONARY ANGIOGRAPHY N/A 11/11/2018   Procedure: LEFT HEART CATH AND CORONARY ANGIOGRAPHY;  Surgeon: Troy Sine, MD;  Location: [place] [facility];  Service: Cardiovascular;  Laterality: N/A;   NASAL SEPTUM SURGERY  1995   REVERSE SHOULDER ARTHROPLASTY Left 11/24/2020   Procedure: REVERSE SHOULDER ARTHROPLASTY;  Surgeon:  Netta Cedars, MD;  Location: [facility];  Service: Orthopedics;  Laterality: Left;  interscalene block   TOTAL KNEE ARTHROPLASTY Right     Current Medications: Current Meds  Medication Sig   acetaminophen (TYLENOL) 500 MG tablet Take 500 mg by mouth daily as needed (before golfing.).   b complex vitamins tablet Take 1 tablet by mouth daily.   cholecalciferol (VITAMIN D3) 25 MCG (1000 UT) tablet Take 1,000 Units by mouth daily.   clindamycin (CLEOCIN) 300 MG capsule TAKE 2 CAPSULES BY MOUTH 45 MINUTES PRIOR TO DENTAL VISIT   clopidogrel (PLAVIX) 75 MG tablet TAKE 1 TABLET(75 MG) BY MOUTH DAILY   Colchicine 0.6 MG CAPS Take 1 capsule by mouth 2 (two) times daily.   EPINEPHrine 0.3 mg/0.3 mL IJ SOAJ injection Use as directed for life-threatening allergic reaction. (Patient taking differently: Inject 0.3 mg into the muscle as needed for anaphylaxis (meat allergy (alpha gal)).)   glucose monitoring kit (FREESTYLE) monitoring kit 1 each by Does not apply route as needed for other.   JARDIANCE 25 MG TABS tablet Take 25 mg by mouth daily.   nitroGLYCERIN (NITROSTAT) 0.4 MG SL tablet Place 1 tablet (0.4 mg total) under the tongue every 5 (five) minutes as needed for  chest pain.   rosuvastatin (CRESTOR) 20 MG tablet TAKE 1 TABLET(20 MG) BY MOUTH DAILY   telmisartan (MICARDIS) 20 MG tablet TAKE 1 TABLET(20 MG) BY MOUTH DAILY     Allergies:   Other, Penicillins, and Hydrocodeine [dihydrocodeine]   Social History   Socioeconomic History   Marital status: Married    Spouse name: Not on file   Number of children: Not on file   Years of education: Not on file   Highest education level: Not on file  Occupational History   Not on file  Tobacco Use   Smoking status: Never    Passive exposure: Never   Smokeless tobacco: Never  Vaping Use   Vaping Use: Never used  Substance and Sexual Activity   Alcohol use: No   Drug use: No   Sexual activity: Not on file  Other Topics Concern   Not on file   Social History Narrative   Not on file   Social Determinants of Health   Financial Resource Strain: Not on file  Food Insecurity: Not on file  Transportation Needs: Not on file  Physical Activity: Not on file  Stress: Not on file  Social Connections: Not on file     Family History: The patient's family history includes Pancreatic cancer in his father; Stomach cancer in his mother. ROS:   Please see the history of present illness.    All other systems reviewed and are negative.  EKGs/Labs/Other Studies Reviewed:    The following studies were reviewed today:  Recent Labs: 11/14/2021: ALT 19; BUN 18; Creatinine, Ser 1.00; Hemoglobin 16.3; Platelets 275; Potassium 4.3; Sodium 144  Recent Lipid Panel    Component Value Date/Time   CHOL 128 08/03/2021 0840   TRIG 106 08/03/2021 0840   HDL 55 08/03/2021 0840   CHOLHDL 2.3 08/03/2021 0840   LDLCALC 54 08/03/2021 0840    Physical Exam:    VS:  BP (!) 145/90 (BP Location: Left Arm, Patient Position: Sitting, Cuff Size: Normal)   Pulse 82   Ht 5' 11"  (1.803 m)   Wt 199 lb (90.3 kg)   SpO2 96%   BMI 27.75 kg/m     Wt Readings from Last 3 Encounters:  02/13/22 199 lb (90.3 kg)  11/14/21 198 lb (89.8 kg)  05/09/21 196 lb 12.8 oz (89.3 kg)     GEN:  Well nourished, well developed in no acute distress HEENT: Normal NECK: No JVD; No carotid bruits LYMPHATICS: No lymphadenopathy CARDIAC: 1/6 aortic outflow murmur RRR, no murmurs, rubs, gallops RESPIRATORY:  Clear to auscultation without rales, wheezing or rhonchi  ABDOMEN: Soft, non-tender, non-distended MUSCULOSKELETAL:  No edema; No deformity  SKIN: Warm and dry NEUROLOGIC:  Alert and oriented x 3 PSYCHIATRIC:  Normal affect    Signed, Shirlee More, MD  02/13/2022 10:59 AM    [place]

## 2022-02-13 ENCOUNTER — Encounter: Payer: Self-pay | Admitting: Cardiology

## 2022-02-13 ENCOUNTER — Ambulatory Visit: Payer: Medicare PPO | Admitting: Cardiology

## 2022-02-13 ENCOUNTER — Telehealth: Payer: Self-pay

## 2022-02-13 VITALS — BP 145/90 | HR 82 | Ht 71.0 in | Wt 199.0 lb

## 2022-02-13 DIAGNOSIS — I10 Essential (primary) hypertension: Secondary | ICD-10-CM | POA: Diagnosis not present

## 2022-02-13 DIAGNOSIS — I300 Acute nonspecific idiopathic pericarditis: Secondary | ICD-10-CM | POA: Diagnosis not present

## 2022-02-13 DIAGNOSIS — I351 Nonrheumatic aortic (valve) insufficiency: Secondary | ICD-10-CM

## 2022-02-13 DIAGNOSIS — E782 Mixed hyperlipidemia: Secondary | ICD-10-CM

## 2022-02-13 DIAGNOSIS — I25118 Atherosclerotic heart disease of native coronary artery with other forms of angina pectoris: Secondary | ICD-10-CM | POA: Diagnosis not present

## 2022-02-13 DIAGNOSIS — E119 Type 2 diabetes mellitus without complications: Secondary | ICD-10-CM

## 2022-02-13 NOTE — Telephone Encounter (Signed)
   Name: Eugene Taylor  DOB: 1950/09/11  MRN: [id number]   Primary Cardiologist: [name], MD  Chart reviewed as part of pre-operative protocol coverage.   Eugene Taylor was last seen on 02/13/22 by Dr. Dulce Sellar.  Per Dr. Dulce Sellar, he may hold plavix 5-7 days prior to knee surgery. Resume after when safe to do so.  Therefore, based on ACC/AHA guidelines, the patient would be at acceptable risk for the planned procedure without further cardiovascular testing.    I will route this recommendation to the requesting party via Epic fax function and remove from pre-op pool. Please call with questions.  Marcelino Duster, PA 02/13/2022, 2:44 PM

## 2022-02-13 NOTE — Telephone Encounter (Signed)
   Pre-operative Risk Assessment    Patient Name: Eugene Taylor  DOB: Jan 29, 1951 MRN: [id number]      Request for Surgical Clearance    Procedure:   Rt Knee Scopy; Synovectomy  Date of Surgery:  Clearance 04/16/22                                 Surgeon:  Dr. Ollen Gross Surgeon's Group or Practice Name:  [facility] Phone number:  [phone] Fax number:  [phone]   Type of Clearance Requested:   - Medical    Type of Anesthesia:   Choice   Additional requests/questions:   Need instructions for Plavix  Signed, Viviano Simas   02/13/2022, 1:56 PM

## 2022-02-13 NOTE — Patient Instructions (Signed)
Medication Instructions:  Your physician recommends that you continue on your current medications as directed. Please refer to the Current Medication list given to you today.  *If you need a refill on your cardiac medications before your next appointment, please call your pharmacy*   Lab Work: Your physician recommends that you return for lab work in:   Labs today: CMP, Lipids, CBC, Sed Rate, CRP   If you have labs (blood work) drawn today and your tests are completely normal, you will receive your results only by: MyChart Message (if you have MyChart) OR A paper copy in the mail If you have any lab test that is abnormal or we need to change your treatment, we will call you to review the results.   Testing/Procedures: None   Follow-Up: At [facility], you and your health needs are our priority.  As part of our continuing mission to provide you with exceptional heart care, we have created designated Provider Care Teams.  These Care Teams include your primary Cardiologist (physician) and Advanced Practice Providers (APPs -  Physician Assistants and Nurse Practitioners) who all work together to provide you with the care you need, when you need it.  We recommend signing up for the patient portal called "MyChart".  Sign up information is provided on this After Visit Summary.  MyChart is used to connect with patients for Virtual Visits (Telemedicine).  Patients are able to view lab/test results, encounter notes, upcoming appointments, etc.  Non-urgent messages can be sent to your provider as well.   To learn more about what you can do with MyChart, go to [email or web address].    Your next appointment:   4 month(s)  The format for your next appointment:   In Person  Provider:   [name], MD    Other Instructions None  Important Information About Sugar

## 2022-02-13 NOTE — Telephone Encounter (Signed)
Pt saw Dr. Dulce Sellar today. I will defer recommendations for plavix and procedure to him.

## 2022-02-14 LAB — COMPREHENSIVE METABOLIC PANEL
ALT: 25 IU/L (ref 0–44)
AST: 23 IU/L (ref 0–40)
Albumin/Globulin Ratio: 1.7 (ref 1.2–2.2)
Albumin: 4.5 g/dL (ref 3.8–4.8)
Alkaline Phosphatase: 79 IU/L (ref 44–121)
BUN/Creatinine Ratio: 19 (ref 10–24)
BUN: 20 mg/dL (ref 8–27)
Bilirubin Total: 0.6 mg/dL (ref 0.0–1.2)
CO2: 20 mmol/L (ref 20–29)
Calcium: 9.7 mg/dL (ref 8.6–10.2)
Chloride: 104 mmol/L (ref 96–106)
Creatinine, Ser: 1.03 mg/dL (ref 0.76–1.27)
Globulin, Total: 2.7 g/dL (ref 1.5–4.5)
Glucose: 92 mg/dL (ref 70–99)
Potassium: 4.3 mmol/L (ref 3.5–5.2)
Sodium: 139 mmol/L (ref 134–144)
Total Protein: 7.2 g/dL (ref 6.0–8.5)
eGFR: 79 mL/min/{1.73_m2} (ref >59)

## 2022-02-14 LAB — SEDIMENTATION RATE: Sed Rate: 26 mm/hr (ref 0–30)

## 2022-02-14 LAB — CBC
Hematocrit: 51.8 % — ABNORMAL HIGH (ref 37.5–51.0)
Hemoglobin: 17.4 g/dL (ref 13.0–17.7)
MCH: 29 pg (ref 26.6–33.0)
MCHC: 33.6 g/dL (ref 31.5–35.7)
MCV: 86 fL (ref 79–97)
Platelets: 249 10*3/uL (ref 150–450)
RBC: 6 x10E6/uL — ABNORMAL HIGH (ref 4.14–5.80)
RDW: 13.4 % (ref 11.6–15.4)
WBC: 5 10*3/uL (ref 3.4–10.8)

## 2022-02-14 LAB — LIPID PANEL
Chol/HDL Ratio: 2.9 ratio (ref 0.0–5.0)
Cholesterol, Total: 142 mg/dL (ref 100–199)
HDL: 49 mg/dL (ref >39)
LDL Chol Calc (NIH): 64 mg/dL (ref 0–99)
Triglycerides: 174 mg/dL — ABNORMAL HIGH (ref 0–149)
VLDL Cholesterol Cal: 29 mg/dL (ref 5–40)

## 2022-02-14 LAB — C-REACTIVE PROTEIN: CRP: <1 mg/L (ref 0–10)

## 2022-06-12 ENCOUNTER — Other Ambulatory Visit: Payer: Self-pay | Admitting: Cardiology

## 2022-06-19 NOTE — Progress Notes (Signed)
Cardiology Office Note:    Date:  06/20/2022   ID:  Eugene Taylor, DOB Dec 06, 1950, MRN [id number]  PCP:  Algis Greenhouse, MD  Cardiologist:  Shirlee More, MD    Referring MD: Algis Greenhouse, MD    ASSESSMENT:    1. Recurrent idiopathic pericarditis   2. Coronary artery disease of native artery of native heart with stable angina pectoris ([place])   3. Nonrheumatic aortic valve insufficiency   4. Essential hypertension   5. Mixed hyperlipidemia   6. Type 2 diabetes mellitus without complication, without long-term current use of insulin ([facility])    PLAN:    In order of problems listed above:  He continues to do well without recurrent pericarditis I pulled a recent abstract showing 75% of patients in the series recurred when the drugs were allowed to separate was withdrawn after 18 months I suspect to require ongoing therapy long-term but appreciate Dr. Neldon Mc experience using these agents to weigh in whether we can decrease the frequency. Stable CAD no anginal discomfort he has a good exercise tolerance lifestyle high level of activity and will continue long-term monotherapy antiplatelet clopidogrel along with his antihypertensive agents and high intensity statin Recheck echocardiogram regarding gradient by physical exam not severe Well-controlled continue his treatment currently including his ARB Continue his high intensity statin we will check lipid profile CMP today Well-controlled on oral agents   Next appointment: 6 months   Medication Adjustments/Labs and Tests Ordered: Current medicines are reviewed at length with the patient today.  Concerns regarding medicines are outlined above.  No orders of the defined types were placed in this encounter.  No orders of the defined types were placed in this encounter.   Chief Complaint  Patient presents with   Follow-up    He has had recurrent and relapsing idiopathic pericarditis requiring reinstitution of ralonacept for clinical  control    History of Present Illness:    Eugene Taylor is a 71 y.o. male with a hx of complex heart disease including recurrent idiopathic pericarditis failing treatment with colchicine and nonsteroidal anti-inflammatory drug and corticosteroids responding to immune modulation with ranolacept recurring therapy requiring reinitiation, CAD with PCI and stent proximal left circumflex 11/11/2018, mild aortic stenosis and regurgitation, mild enlargement of ascending aorta hypertensive heart disease without heart failure and hyper hyperlipidemia  last seen 02/13/2022.  With his last sedimentation rate and C-reactive protein were normal.  I discussed his case with allergy immunology Dr. Neldon Mc and he was to have followed up in his office and he said that he would weigh in on duration dosage and frequency of treatment.  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 His had no recurrent pericarditis and tolerates rilonacept. I asked him again to see Dr. Neldon Mc looking for advice about reducing frequency of his treatment. For cardiac perspective doing well exercises and has no exercise intolerance edema shortness of breath chest pain palpitation or syncope.  He remains on colchicine which is beneficial for treating CAD. He tolerates his statin without muscle pain or weakness his last LDL 64 in June of this year  Past Medical History:  Diagnosis Date   Abnormal cardiac CT angiography    Acute idiopathic pericarditis 04/25/2016   Overview:  2017: hosp, EKG changes, colchicine/indomethacin   Acute medial meniscal tear 12/15/2014   Angina pectoris ([place]) 05/05/2018   Aortic regurgitation 07/07/2018   Aortic stenosis, mild 07/07/2018   Arthritis  LEFT SHOULDER   Asthma    pt unsure   Benign hypertension 11/27/2015   Bicuspid aortic valve 05/22/2018   CAD in native artery 1/54/0086   Complication of anesthesia    " during a knee  surgery , my blood pressure was up & stayed up "   Coronary artery disease    Coronary artery disease of native artery of native heart with stable angina pectoris ([place]) 11/09/2018   Enlarged thoracic aorta ([place]) 05/22/2018   External hemorrhoid, thrombosed 12/28/2015   Overview:  2017:    Fatty liver    Heart murmur    History of adenomatous polyp of colon    History of kidney stones    Hyperlipidemia    Hypertension    Lower respiratory infection 02/08/2016   Mixed hyperlipidemia 11/27/2015   Obstructive sleep apnea 11/27/2015   OSA on CPAP    uses cpap   Recurrent idiopathic pericarditis 12/23/2017   Right knee meniscal tear    Screening for diabetes mellitus (DM) 11/27/2015   Type 2 diabetes mellitus without complication, without long-term current use of insulin ([place]) 11/27/2015   Formatting of this note might be different from the original. 2019: 107/6.4 2020: 5.9 2020: 130/7.7   Wears glasses     Past Surgical History:  Procedure Laterality Date   APPENDECTOMY  age 38   CARDIAC CATHETERIZATION  11/11/2018   CHONDROPLASTY Right 12/15/2014   Procedure: CHONDROPLASTY;  Surgeon: Gaynelle Arabian, MD;  Location: [facility];  Service: Orthopedics;  Laterality: Right;   COLONOSCOPY W/ POLYPECTOMY  2011   CORONARY STENT INTERVENTION  11/11/2018   CORONARY STENT INTERVENTION N/A 11/11/2018   Procedure: CORONARY STENT INTERVENTION;  Surgeon: Troy Sine, MD;  Location: [place] [facility];  Service: Cardiovascular;  Laterality: N/A;   CYSTO/  URETEROSCOPIC STONE EXTRACTIONS  1995   KNEE ARTHROSCOPY WITH LATERAL MENISECTOMY Right 12/15/2014   Procedure: KNEE ARTHROSCOPY WITH LATERAL MENISECTOMY;  Surgeon: Gaynelle Arabian, MD;  Location: [place];  Service: Orthopedics;  Laterality: Right;   KNEE ARTHROSCOPY WITH MEDIAL MENISECTOMY Right 12/15/2014   Procedure: KNEE ARTHROSCOPY WITH MEDIAL MENISECTOMY;  Surgeon: Gaynelle Arabian, MD;  [facility]   Service: Orthopedics;  Laterality: Right;   LEFT HEART CATH AND CORONARY ANGIOGRAPHY N/A 11/11/2018   Procedure: LEFT HEART CATH AND CORONARY ANGIOGRAPHY;  Surgeon: Troy Sine, MD;  Location: [place] [facility];  Service: Cardiovascular;  Laterality: N/A;   NASAL SEPTUM SURGERY  1995   REVERSE SHOULDER ARTHROPLASTY Left 11/24/2020   Procedure: REVERSE SHOULDER ARTHROPLASTY;  Surgeon: Netta Cedars, MD;  Location: [facility];  Service: Orthopedics;  Laterality: Left;  interscalene block   TOTAL KNEE ARTHROPLASTY Right     Current Medications: Current Meds  Medication Sig   acetaminophen (TYLENOL) 500 MG tablet Take 500 mg by mouth daily as needed (before golfing.).   ARCALYST 220 MG SOLR Inject 22 mg into the skin once a week.   b complex vitamins tablet Take 1 tablet by mouth daily.   cholecalciferol (VITAMIN D3) 25 MCG (1000 UT) tablet Take 1,000 Units by mouth daily.   clindamycin (CLEOCIN) 300 MG capsule TAKE 2 CAPSULES BY MOUTH 45 MINUTES PRIOR TO DENTAL VISIT   clopidogrel (PLAVIX) 75 MG tablet TAKE 1 TABLET(75 MG) BY MOUTH DAILY   Colchicine 0.6 MG CAPS Take 1 capsule by mouth 2 (two) times daily.   EPINEPHrine 0.3 mg/0.3 mL IJ SOAJ injection Use as directed for life-threatening allergic  reaction. (Patient taking differently: Inject 0.3 mg into the muscle as needed for anaphylaxis (meat allergy (alpha gal)).)   glucose monitoring kit (FREESTYLE) monitoring kit 1 each by Does not apply route as needed for other.   JARDIANCE 25 MG TABS tablet Take 25 mg by mouth daily.   nitroGLYCERIN (NITROSTAT) 0.4 MG SL tablet Place 1 tablet (0.4 mg total) under the tongue every 5 (five) minutes as needed for chest pain.   rosuvastatin (CRESTOR) 20 MG tablet TAKE 1 TABLET(20 MG) BY MOUTH DAILY   telmisartan (MICARDIS) 20 MG tablet Take 1 tablet (20 mg total) by mouth daily.     Allergies:   Other, Penicillins, and Hydrocodeine [dihydrocodeine]   Social History   Socioeconomic History   Marital  status: Married    Spouse name: Not on file   Number of children: Not on file   Years of education: Not on file   Highest education level: Not on file  Occupational History   Not on file  Tobacco Use   Smoking status: Never    Passive exposure: Never   Smokeless tobacco: Never  Vaping Use   Vaping Use: Never used  Substance and Sexual Activity   Alcohol use: No   Drug use: No   Sexual activity: Not on file  Other Topics Concern   Not on file  Social History Narrative   Not on file   Social Determinants of Health   Financial Resource Strain: Not on file  Food Insecurity: Not on file  Transportation Needs: Not on file  Physical Activity: Not on file  Stress: Not on file  Social Connections: Not on file     Family History: The patient's family history includes Pancreatic cancer in his father; Stomach cancer in his mother. ROS:   Please see the history of present illness.    All other systems reviewed and are negative.  EKGs/Labs/Other Studies Reviewed:    The following studies were reviewed today:   Recent Labs: 02/13/2022: ALT 25; BUN 20; Creatinine, Ser 1.03; Hemoglobin 17.4; Platelets 249; Potassium 4.3; Sodium 139  Recent Lipid Panel    Component Value Date/Time   CHOL 142 02/13/2022 1101   TRIG 174 (H) 02/13/2022 1101   HDL 49 02/13/2022 1101   CHOLHDL 2.9 02/13/2022 1101   LDLCALC 64 02/13/2022 1101    Physical Exam:    VS:  BP 123/76 (BP Location: Left Arm, Patient Position: Sitting, Cuff Size: Normal)   Pulse 72   Ht _0  (1.803 m)   Wt 199 lb 3.2 oz (90.4 kg)   SpO2 96%   BMI 27.78 kg/m     Wt Readings from Last 3 Encounters:  06/20/22 199 lb 3.2 oz (90.4 kg)  02/13/22 199 lb (90.3 kg)  11/14/21 198 lb (89.8 kg)     GEN:  Well nourished, well developed in no acute distress HEENT: Normal NECK: No JVD; No carotid bruits LYMPHATICS: No lymphadenopathy CARDIAC: No rub RRR, no rubs, gallops he has a grade 1/6 to 2/6 midsystolic ejection  murmur does not encompass S2 does not radiate to the carotids and no aortic regurgitation RESPIRATORY:  Clear to auscultation without rales, wheezing or rhonchi  ABDOMEN: Soft, non-tender, non-distended MUSCULOSKELETAL:  No edema; No deformity  SKIN: Warm and dry NEUROLOGIC:  Alert and oriented x 3 PSYCHIATRIC:  Normal affect    Signed, Shirlee More, MD  06/20/2022 8:15 AM    [place]

## 2022-06-20 ENCOUNTER — Ambulatory Visit: Payer: Medicare PPO | Attending: Cardiology | Admitting: Cardiology

## 2022-06-20 ENCOUNTER — Encounter: Payer: Self-pay | Admitting: Cardiology

## 2022-06-20 VITALS — BP 123/76 | HR 72 | Ht 71.0 in | Wt 199.2 lb

## 2022-06-20 DIAGNOSIS — I351 Nonrheumatic aortic (valve) insufficiency: Secondary | ICD-10-CM | POA: Diagnosis not present

## 2022-06-20 DIAGNOSIS — I300 Acute nonspecific idiopathic pericarditis: Secondary | ICD-10-CM

## 2022-06-20 DIAGNOSIS — I10 Essential (primary) hypertension: Secondary | ICD-10-CM | POA: Diagnosis not present

## 2022-06-20 DIAGNOSIS — E119 Type 2 diabetes mellitus without complications: Secondary | ICD-10-CM

## 2022-06-20 DIAGNOSIS — I25118 Atherosclerotic heart disease of native coronary artery with other forms of angina pectoris: Secondary | ICD-10-CM | POA: Diagnosis not present

## 2022-06-20 DIAGNOSIS — E782 Mixed hyperlipidemia: Secondary | ICD-10-CM

## 2022-06-20 NOTE — Patient Instructions (Signed)
Medication Instructions:  Your physician recommends that you continue on your current medications as directed. Please refer to the Current Medication list given to you today.  *If you need a refill on your cardiac medications before your next appointment, please call your pharmacy*   Lab Work: Your physician recommends that you have a sedrate, CRP, CMP and lipids done today in the office.  If you have labs (blood work) drawn today and your tests are completely normal, you will receive your results only by: MyChart Message (if you have MyChart) OR A paper copy in the mail If you have any lab test that is abnormal or we need to change your treatment, we will call you to review the results.   Testing/Procedures: Your physician has requested that you have an echocardiogram. Echocardiography is a painless test that uses sound waves to create images of your heart. It provides your doctor with information about the size and shape of your heart and how well your heart's chambers and valves are working. This procedure takes approximately one hour. There are no restrictions for this procedure.    Follow-Up: At [facility], you and your health needs are our priority.  As part of our continuing mission to provide you with exceptional heart care, we have created designated Provider Care Teams.  These Care Teams include your primary Cardiologist (physician) and Advanced Practice Providers (APPs -  Physician Assistants and Nurse Practitioners) who all work together to provide you with the care you need, when you need it.  We recommend signing up for the patient portal called "MyChart".  Sign up information is provided on this After Visit Summary.  MyChart is used to connect with patients for Virtual Visits (Telemedicine).  Patients are able to view lab/test results, encounter notes, upcoming appointments, etc.  Non-urgent messages can be sent to your provider as well.   To learn more about what you can  do with MyChart, go to [email or web address].    Your next appointment:   6 month(s)  The format for your next appointment:   In Person  Provider:   [name], MD   Other Instructions Echocardiogram An echocardiogram is a test that uses sound waves (ultrasound) to produce images of the heart. Images from an echocardiogram can provide important information about: Heart size and shape. The size and thickness and movement of your heart's walls. Heart muscle function and strength. Heart valve function or if you have stenosis. Stenosis is when the heart valves are too narrow. If blood is flowing backward through the heart valves (regurgitation). A tumor or infectious growth around the heart valves. Areas of heart muscle that are not working well because of poor blood flow or injury from a heart attack. Aneurysm detection. An aneurysm is a weak or damaged part of an artery wall. The wall bulges out from the normal force of blood pumping through the body. Tell a health care provider about: Any allergies you have. All medicines you are taking, including vitamins, herbs, eye drops, creams, and over-the-counter medicines. Any blood disorders you have. Any surgeries you have had. Any medical conditions you have. Whether you are pregnant or may be pregnant. What are the risks? Generally, this is a safe test. However, problems may occur, including an allergic reaction to dye (contrast) that may be used during the test. What happens before the test? No specific preparation is needed. You may eat and drink normally. What happens during the test? You will take off your clothes from the  waist up and put on a hospital gown. Electrodes or electrocardiogram (ECG)patches may be placed on your chest. The electrodes or patches are then connected to a device that monitors your heart rate and rhythm. You will lie down on a table for an ultrasound exam. A gel will be applied to your chest to help  sound waves pass through your skin. A handheld device, called a transducer, will be pressed against your chest and moved over your heart. The transducer produces sound waves that travel to your heart and bounce back (or "echo" back) to the transducer. These sound waves will be captured in real-time and changed into images of your heart that can be viewed on a video monitor. The images will be recorded on a computer and reviewed by your health care provider. You may be asked to change positions or hold your breath for a short time. This makes it easier to get different views or better views of your heart. In some cases, you may receive contrast through an IV in one of your veins. This can improve the quality of the pictures from your heart. The procedure may vary among health care providers and hospitals.   What can I expect after the test? You may return to your normal, everyday life, including diet, activities, and medicines, unless your health care provider tells you not to do that. Follow these instructions at home: It is up to you to get the results of your test. Ask your health care provider, or the department that is doing the test, when your results will be ready. Keep all follow-up visits. This is important. Summary An echocardiogram is a test that uses sound waves (ultrasound) to produce images of the heart. Images from an echocardiogram can provide important information about the size and shape of your heart, heart muscle function, heart valve function, and other possible heart problems. You do not need to do anything to prepare before this test. You may eat and drink normally. After the echocardiogram is completed, you may return to your normal, everyday life, unless your health care provider tells you not to do that. This information is not intended to replace advice given to you by your health care provider. Make sure you discuss any questions you have with your health care  provider. Document Revised: 04/04/2020 Document Reviewed: 04/04/2020 Elsevier Patient Education  2021 [employer].

## 2022-06-21 LAB — COMPREHENSIVE METABOLIC PANEL
ALT: 26 IU/L (ref 0–44)
AST: 24 IU/L (ref 0–40)
Albumin/Globulin Ratio: 1.7 (ref 1.2–2.2)
Albumin: 4.5 g/dL (ref 3.9–4.9)
Alkaline Phosphatase: 77 IU/L (ref 44–121)
BUN/Creatinine Ratio: 22 (ref 10–24)
BUN: 23 mg/dL (ref 8–27)
Bilirubin Total: 0.6 mg/dL (ref 0.0–1.2)
CO2: 20 mmol/L (ref 20–29)
Calcium: 9.7 mg/dL (ref 8.6–10.2)
Chloride: 103 mmol/L (ref 96–106)
Creatinine, Ser: 1.04 mg/dL (ref 0.76–1.27)
Globulin, Total: 2.6 g/dL (ref 1.5–4.5)
Glucose: 87 mg/dL (ref 70–99)
Potassium: 4 mmol/L (ref 3.5–5.2)
Sodium: 138 mmol/L (ref 134–144)
Total Protein: 7.1 g/dL (ref 6.0–8.5)
eGFR: 78 mL/min/{1.73_m2} (ref >59)

## 2022-06-21 LAB — LIPID PANEL
Chol/HDL Ratio: 3.6 ratio (ref 0.0–5.0)
Cholesterol, Total: 166 mg/dL (ref 100–199)
HDL: 46 mg/dL (ref >39)
LDL Chol Calc (NIH): 85 mg/dL (ref 0–99)
Triglycerides: 206 mg/dL — ABNORMAL HIGH (ref 0–149)
VLDL Cholesterol Cal: 35 mg/dL (ref 5–40)

## 2022-06-21 LAB — SEDIMENTATION RATE: Sed Rate: 22 mm/hr (ref 0–30)

## 2022-06-21 LAB — HIGH SENSITIVITY CRP: CRP, High Sensitivity: 0.85 mg/L (ref 0.00–3.00)

## 2022-07-04 ENCOUNTER — Ambulatory Visit: Payer: Medicare PPO | Attending: Cardiology

## 2022-07-04 DIAGNOSIS — I351 Nonrheumatic aortic (valve) insufficiency: Secondary | ICD-10-CM | POA: Diagnosis not present

## 2022-07-04 LAB — ECHOCARDIOGRAM COMPLETE
AR max vel: 1.37 cm2
AV Area VTI: 1.36 cm2
AV Area mean vel: 1.32 cm2
AV Mean grad: 16.5 mmHg
AV Peak grad: 26.4 mmHg
Ao pk vel: 2.57 m/s
Area-P 1/2: 3.6 cm2
S' Lateral: 4.1 cm

## 2022-07-08 ENCOUNTER — Encounter: Payer: Self-pay | Admitting: Allergy and Immunology

## 2022-07-08 ENCOUNTER — Ambulatory Visit: Payer: Medicare PPO | Admitting: Allergy and Immunology

## 2022-07-08 VITALS — BP 118/82 | HR 78 | Resp 16 | Ht 69.4 in | Wt 199.2 lb

## 2022-07-08 DIAGNOSIS — T7800XD Anaphylactic reaction due to unspecified food, subsequent encounter: Secondary | ICD-10-CM

## 2022-07-08 DIAGNOSIS — I300 Acute nonspecific idiopathic pericarditis: Secondary | ICD-10-CM | POA: Diagnosis not present

## 2022-07-08 DIAGNOSIS — T7800XA Anaphylactic reaction due to unspecified food, initial encounter: Secondary | ICD-10-CM

## 2022-07-08 MED ORDER — EPINEPHRINE 0.3 MG/0.3ML IJ SOAJ: INTRAMUSCULAR | 3 refills | Status: DC

## 2022-07-08 NOTE — Patient Instructions (Addendum)
  1.  Allergen avoidance measures -mammal consumption  2.  EpiPen, Benadryl, MD/ER evaluation for allergic reaction  3.  DECREASE Ranolacept to 160 mg [place] every week.  4. Obtain blood - alpha-gal panel, cbc w/d, CMP  5.  Return to clinic in 1 year or earlier if problem.  6. Obtain flu vaccine and RSV vaccine

## 2022-07-08 NOTE — Progress Notes (Unsigned)
[facility] [place] [facility] [place]   Follow-up Note  Referring Provider: Algis Greenhouse, MD Primary Provider: Algis Greenhouse, MD Date of Office Visit: 07/08/2022  Subjective:   Eugene Taylor (DOB: 1951-02-24) is a 71 y.o. 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  His last visit to this clinic was 09 August 2021.  He remains away from eating mammal and has done very well and has not had any allergic reactions and carries an EpiPen.  His recurrent pericarditis has been under excellent control with the use of anti-IL-1 receptor antagonist.  Currently she he is using 220 mg of Rilonacept cutaneous weekly and continues on colchicine.  Allergies as of 07/08/2022       Reactions   Other Anaphylaxis   Red Meat   Penicillins Other (See Comments)   Did it involve swelling of the face/tongue/throat, SOB, or low BP? Unknown Did it involve sudden or severe rash/hives, skin peeling, or any reaction on the inside of your mouth or nose? Unknown Did you need to seek medical attention at a hospital or doctor's office? Unknown When did it last happen?      Childhood allergy If all above answers are "NO", may proceed with cephalosporin use.   Hydrocodeine [dihydrocodeine] Itching        Medication List    acetaminophen 500 MG tablet Commonly known as: TYLENOL Take 500 mg by mouth daily as needed (before golfing.).   Arcalyst 220 MG Solr Generic drug: Rilonacept Inject 22 mg into the skin once a week.   b complex vitamins tablet Take 1 tablet by mouth daily.   cholecalciferol 25 MCG (1000 UNIT) tablet Commonly known as: VITAMIN D3 Take 1,000 Units by mouth daily.   clindamycin 300 MG capsule Commonly known as: CLEOCIN TAKE 2 CAPSULES BY MOUTH 45 MINUTES PRIOR TO DENTAL VISIT   clopidogrel 75  MG tablet Commonly known as: PLAVIX TAKE 1 TABLET(75 MG) BY MOUTH DAILY   Colchicine 0.6 MG Caps Take 1 capsule by mouth 2 (two) times daily.   EPINEPHrine 0.3 mg/0.3 mL Soaj injection Commonly known as: EPI-PEN Use as directed for life-threatening allergic reaction.   glucose monitoring kit monitoring kit 1 each by Does not apply route as needed for other.   Jardiance 25 MG Tabs tablet Generic drug: empagliflozin Take 25 mg by mouth daily.   nitroGLYCERIN 0.4 MG SL tablet Commonly known as: Nitrostat Place 1 tablet (0.4 mg total) under the tongue every 5 (five) minutes as needed for chest pain.   rosuvastatin 20 MG tablet Commonly known as: CRESTOR TAKE 1 TABLET(20 MG) BY MOUTH DAILY   telmisartan 20 MG tablet Commonly known as: MICARDIS Take 1 tablet (20 mg total) by mouth daily.    Past Medical History:  Diagnosis Date   Abnormal cardiac CT angiography    Acute idiopathic pericarditis 04/25/2016   Overview:  2017: hosp, EKG changes, colchicine/indomethacin   Acute medial meniscal tear 12/15/2014   Angina pectoris ([place]) 05/05/2018   Aortic regurgitation 07/07/2018   Aortic stenosis, mild 07/07/2018   Arthritis    LEFT SHOULDER   Asthma    pt unsure   Benign hypertension 11/27/2015   Bicuspid aortic valve 05/22/2018   CAD in native artery 03/01/8674   Complication of anesthesia    " during a knee surgery ,  my blood pressure was up & stayed up "   Coronary artery disease    Coronary artery disease of native artery of native heart with stable angina pectoris ([place]) 11/09/2018   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   External hemorrhoid, thrombosed 12/28/2015   Overview:  2017:    Fatty liver    Heart murmur    History of adenomatous polyp of colon    History of kidney stones    Hyperlipidemia    Hypertension    Lower respiratory infection 02/08/2016   Mixed hyperlipidemia 11/27/2015   Obstructive sleep apnea 11/27/2015   OSA on CPAP    uses cpap   Recurrent  idiopathic pericarditis 12/23/2017   Right knee meniscal tear    Screening for diabetes mellitus (DM) 11/27/2015   Type 2 diabetes mellitus without complication, without long-term current use of insulin ([place]) 11/27/2015   Formatting of this note might be different from the original. 2019: 107/6.4 2020: 5.9 2020: 130/7.7   Wears glasses     Past Surgical History:  Procedure Laterality Date   APPENDECTOMY  age 36   CARDIAC CATHETERIZATION  11/11/2018   CHONDROPLASTY Right 12/15/2014   Procedure: CHONDROPLASTY;  Surgeon: Gaynelle Arabian, MD;  [facility]: [facility];  Service: Orthopedics;  Laterality: Right;   COLONOSCOPY W/ POLYPECTOMY  2011   CORONARY STENT INTERVENTION  11/11/2018   CORONARY STENT INTERVENTION N/A 11/11/2018   Procedure: CORONARY STENT INTERVENTION;  Surgeon: Troy Sine, MD;  Location: [place] [facility];  Service: Cardiovascular;  Laterality: N/A;   CYSTO/  URETEROSCOPIC STONE EXTRACTIONS  1995   KNEE ARTHROSCOPY WITH LATERAL MENISECTOMY Right 12/15/2014   Procedure: KNEE ARTHROSCOPY WITH LATERAL MENISECTOMY;  Surgeon: Gaynelle Arabian, MD;  Location: [facility];  Service: Orthopedics;  Laterality: Right;   KNEE ARTHROSCOPY WITH MEDIAL MENISECTOMY Right 12/15/2014   Procedure: KNEE ARTHROSCOPY WITH MEDIAL MENISECTOMY;  Surgeon: Gaynelle Arabian, MD;  [facility]  Service: Orthopedics;  Laterality: Right;   LEFT HEART CATH AND CORONARY ANGIOGRAPHY N/A 11/11/2018   Procedure: LEFT HEART CATH AND CORONARY ANGIOGRAPHY;  Surgeon: Troy Sine, MD;  Location: [place] [facility];  Service: Cardiovascular;  Laterality: N/A;   NASAL SEPTUM SURGERY  1995   REVERSE SHOULDER ARTHROPLASTY Left 11/24/2020   Procedure: REVERSE SHOULDER ARTHROPLASTY;  Surgeon: Netta Cedars, MD;  Location: [facility];  Service: Orthopedics;  Laterality: Left;  interscalene block   TOTAL KNEE ARTHROPLASTY Right     Review of systems negative except as noted in  HPI / PMHx or noted below:  Review of Systems  Constitutional: Negative.   HENT: Negative.    Eyes: Negative.   Respiratory: Negative.    Cardiovascular: Negative.   Gastrointestinal: Negative.   Genitourinary: Negative.   Musculoskeletal: Negative.   Skin: Negative.   Neurological: Negative.   Endo/Heme/Allergies: Negative.   Psychiatric/Behavioral: Negative.       Objective:   Vitals:   07/08/22 1051  BP: 118/82  Pulse: 78  Resp: 16  SpO2: 95%   Height: 5' 9.4" (176.3 cm)  Weight: 199 lb 3.2 oz (90.4 kg)   Physical Exam Constitutional:      Appearance: He is not diaphoretic.  HENT:     Head: Normocephalic.     Right Ear: Tympanic membrane, ear canal and external ear normal.     Left Ear: Tympanic membrane, ear canal and external ear normal.     Nose: Nose normal. No mucosal edema or rhinorrhea.  Mouth/Throat:     Pharynx: Uvula midline. No oropharyngeal exudate.  Eyes:     Conjunctiva/sclera: Conjunctivae normal.  Neck:     Thyroid: No thyromegaly.     Trachea: Trachea normal. No tracheal tenderness or tracheal deviation.  Cardiovascular:     Rate and Rhythm: Normal rate and regular rhythm.     Heart sounds: S1 normal and S2 normal. Murmur (Systolic) heard.  Pulmonary:     Effort: No respiratory distress.     Breath sounds: Normal breath sounds. No stridor. No wheezing or rales.  Lymphadenopathy:     Head:     Right side of head: No tonsillar adenopathy.     Left side of head: No tonsillar adenopathy.     Cervical: No cervical adenopathy.  Skin:    Findings: No erythema or rash.     Nails: There is no clubbing.  Neurological:     Mental Status: He is alert.     Diagnostics: none  Assessment and Plan:   1. Allergy with anaphylaxis due to food   2. Recurrent idiopathic pericarditis    1.  Allergen avoidance measures -mammal consumption  2.  EpiPen, Benadryl, MD/ER evaluation for allergic reaction  3.  DECREASE Ranolacept to 160 mg [place] every  week.  4. Obtain blood - alpha-gal panel, cbc w/d, CMP  5.  Return to clinic in 1 year or earlier if problem.  6. Obtain flu vaccine and RSV vaccine  Kennth has really done well while using anti-IL-1 receptor antagonist regarding control of his recurrent pericarditis and we will now see if we can use a lower dose over the course of this next year by decreasing his Ranolacept from 220 mg to 160 mg subcutaneous every week.  We will perform screening blood tests and as well evaluate his titer of alpha gal antibody to see if he may be a candidate for food challenge directed against mammal meat.  I will contact him with the results of his blood test once they are available for review.  Allena Katz, MD Allergy / Immunology [place]

## 2022-07-09 ENCOUNTER — Encounter: Payer: Self-pay | Admitting: Allergy and Immunology

## 2022-07-10 MED ORDER — ARCALYST 220 MG ~~LOC~~ SOLR: SUBCUTANEOUS | 1 refills | Status: DC

## 2022-07-10 NOTE — Addendum Note (Signed)
Addended by: Deborra Medina on: 07/10/2022 05:05 PM   Modules accepted: Orders

## 2022-07-11 ENCOUNTER — Other Ambulatory Visit: Payer: Self-pay | Admitting: Allergy and Immunology

## 2022-07-12 ENCOUNTER — Other Ambulatory Visit: Payer: Self-pay | Admitting: *Deleted

## 2022-07-12 MED ORDER — STERILE WATER FOR INJECTION IJ SOLN: INTRAMUSCULAR | 1 refills | Status: DC

## 2022-07-13 LAB — COMPREHENSIVE METABOLIC PANEL
ALT: 21 IU/L (ref 0–44)
AST: 23 IU/L (ref 0–40)
Albumin/Globulin Ratio: 1.9 (ref 1.2–2.2)
Albumin: 4.4 g/dL (ref 3.9–4.9)
Alkaline Phosphatase: 76 IU/L (ref 44–121)
BUN/Creatinine Ratio: 18 (ref 10–24)
BUN: 20 mg/dL (ref 8–27)
Bilirubin Total: 0.6 mg/dL (ref 0.0–1.2)
CO2: 20 mmol/L (ref 20–29)
Calcium: 9.5 mg/dL (ref 8.6–10.2)
Chloride: 105 mmol/L (ref 96–106)
Creatinine, Ser: 1.1 mg/dL (ref 0.76–1.27)
Globulin, Total: 2.3 g/dL (ref 1.5–4.5)
Glucose: 86 mg/dL (ref 70–99)
Potassium: 4.4 mmol/L (ref 3.5–5.2)
Sodium: 140 mmol/L (ref 134–144)
Total Protein: 6.7 g/dL (ref 6.0–8.5)
eGFR: 73 mL/min/{1.73_m2} (ref >59)

## 2022-07-13 LAB — CBC WITH DIFFERENTIAL
Basophils Absolute: 0.1 10*3/uL (ref 0.0–0.2)
Basos: 1 %
EOS (ABSOLUTE): 0.2 10*3/uL (ref 0.0–0.4)
Eos: 3 %
Hematocrit: 51.4 % — ABNORMAL HIGH (ref 37.5–51.0)
Hemoglobin: 17.5 g/dL (ref 13.0–17.7)
Immature Grans (Abs): 0 10*3/uL (ref 0.0–0.1)
Immature Granulocytes: 0 %
Lymphocytes Absolute: 2 10*3/uL (ref 0.7–3.1)
Lymphs: 33 %
MCH: 30.2 pg (ref 26.6–33.0)
MCHC: 34 g/dL (ref 31.5–35.7)
MCV: 89 fL (ref 79–97)
Monocytes Absolute: 0.6 10*3/uL (ref 0.1–0.9)
Monocytes: 10 %
Neutrophils Absolute: 3.1 10*3/uL (ref 1.4–7.0)
Neutrophils: 53 %
RBC: 5.8 x10E6/uL (ref 4.14–5.80)
RDW: 13.8 % (ref 11.6–15.4)
WBC: 6 10*3/uL (ref 3.4–10.8)

## 2022-07-13 LAB — ALPHA-GAL PANEL
Allergen Lamb IgE: 1.73 kU/L — AB
Beef IgE: 3.42 kU/L — AB
IgE (Immunoglobulin E), Serum: 65 [IU]/mL (ref 6–495)
O215-IgE Alpha-Gal: 9.3 kU/L — AB
Pork IgE: 1.5 kU/L — AB

## 2022-08-04 ENCOUNTER — Other Ambulatory Visit: Payer: Self-pay | Admitting: Cardiology

## 2022-08-05 NOTE — Telephone Encounter (Signed)
Rx refill sent to pharmacy. 

## 2022-09-09 ENCOUNTER — Other Ambulatory Visit: Payer: Self-pay | Admitting: Allergy and Immunology

## 2022-09-09 NOTE — Telephone Encounter (Signed)
Since this is a medication I am not familiar with I just wanted to make sure it was ok to refill?

## 2022-09-17 ENCOUNTER — Other Ambulatory Visit: Payer: Self-pay | Admitting: *Deleted

## 2022-09-17 MED ORDER — STERILE WATER FOR INJECTION IJ SOLN: 10.0000 mL | INTRAMUSCULAR | 2 refills | Status: DC

## 2022-10-18 ENCOUNTER — Encounter: Payer: Self-pay | Admitting: Cardiology

## 2022-11-04 ENCOUNTER — Other Ambulatory Visit: Payer: Self-pay | Admitting: Allergy and Immunology

## 2022-11-09 ENCOUNTER — Other Ambulatory Visit: Payer: Self-pay | Admitting: Cardiology

## 2022-11-11 ENCOUNTER — Telehealth: Payer: Self-pay

## 2022-11-11 NOTE — Telephone Encounter (Signed)
Rx refill sent to pharmacy. 

## 2022-11-11 NOTE — Telephone Encounter (Signed)
Received call from [facility] personnel saying that they need an RX for the sterile water- 10 mL per vial- 1 month supply- quantity of 40 mL, to use with the Arcalyst. Can fax to [phone] or e-scribe. If you need to reach them, call at [phone].

## 2022-11-12 MED ORDER — STERILE WATER FOR INJECTION IJ SOLN: 10.0000 mL | INTRAMUSCULAR | 11 refills | Status: AC

## 2022-11-12 NOTE — Addendum Note (Signed)
Addended by: Carin Hock on: 11/12/2022 08:15 AM   Modules accepted: Orders

## 2022-12-16 ENCOUNTER — Other Ambulatory Visit: Payer: Self-pay | Admitting: Cardiology

## 2022-12-16 NOTE — Telephone Encounter (Signed)
Rx to [facility]

## 2023-02-02 ENCOUNTER — Other Ambulatory Visit: Payer: Self-pay | Admitting: Cardiology

## 2023-02-06 ENCOUNTER — Telehealth: Payer: Self-pay | Admitting: Cardiology

## 2023-02-06 MED ORDER — COLCHICINE 0.6 MG PO CAPS: 1.0000 | ORAL_CAPSULE | Freq: Two times a day (BID) | ORAL | 1 refills | Status: DC

## 2023-02-06 NOTE — Telephone Encounter (Signed)
Pt c/o medication issue:  1. Name of Medication: Colchicine (MITIGARE) 0.6 MG CAPS   2. How are you currently taking this medication (dosage and times per day)?   3. Are you having a reaction (difficulty breathing--STAT)?   4. What is your medication issue? Patient wants to know if he is still to be on this medication.  If so he needs a refill and would like it sent to  [id number] - [place], [place] - [address]

## 2023-02-25 ENCOUNTER — Other Ambulatory Visit: Payer: Self-pay | Admitting: Cardiology

## 2023-03-11 IMAGING — DX DG SHOULDER 1V*L*
1 series · 1 of 1 positions shown · non-contrast
Comparison: Chest radiograph 03/04/2020

CLINICAL DATA: Left shoulder arthroplasty.

EXAM:
LEFT SHOULDER

[shoulder ap]
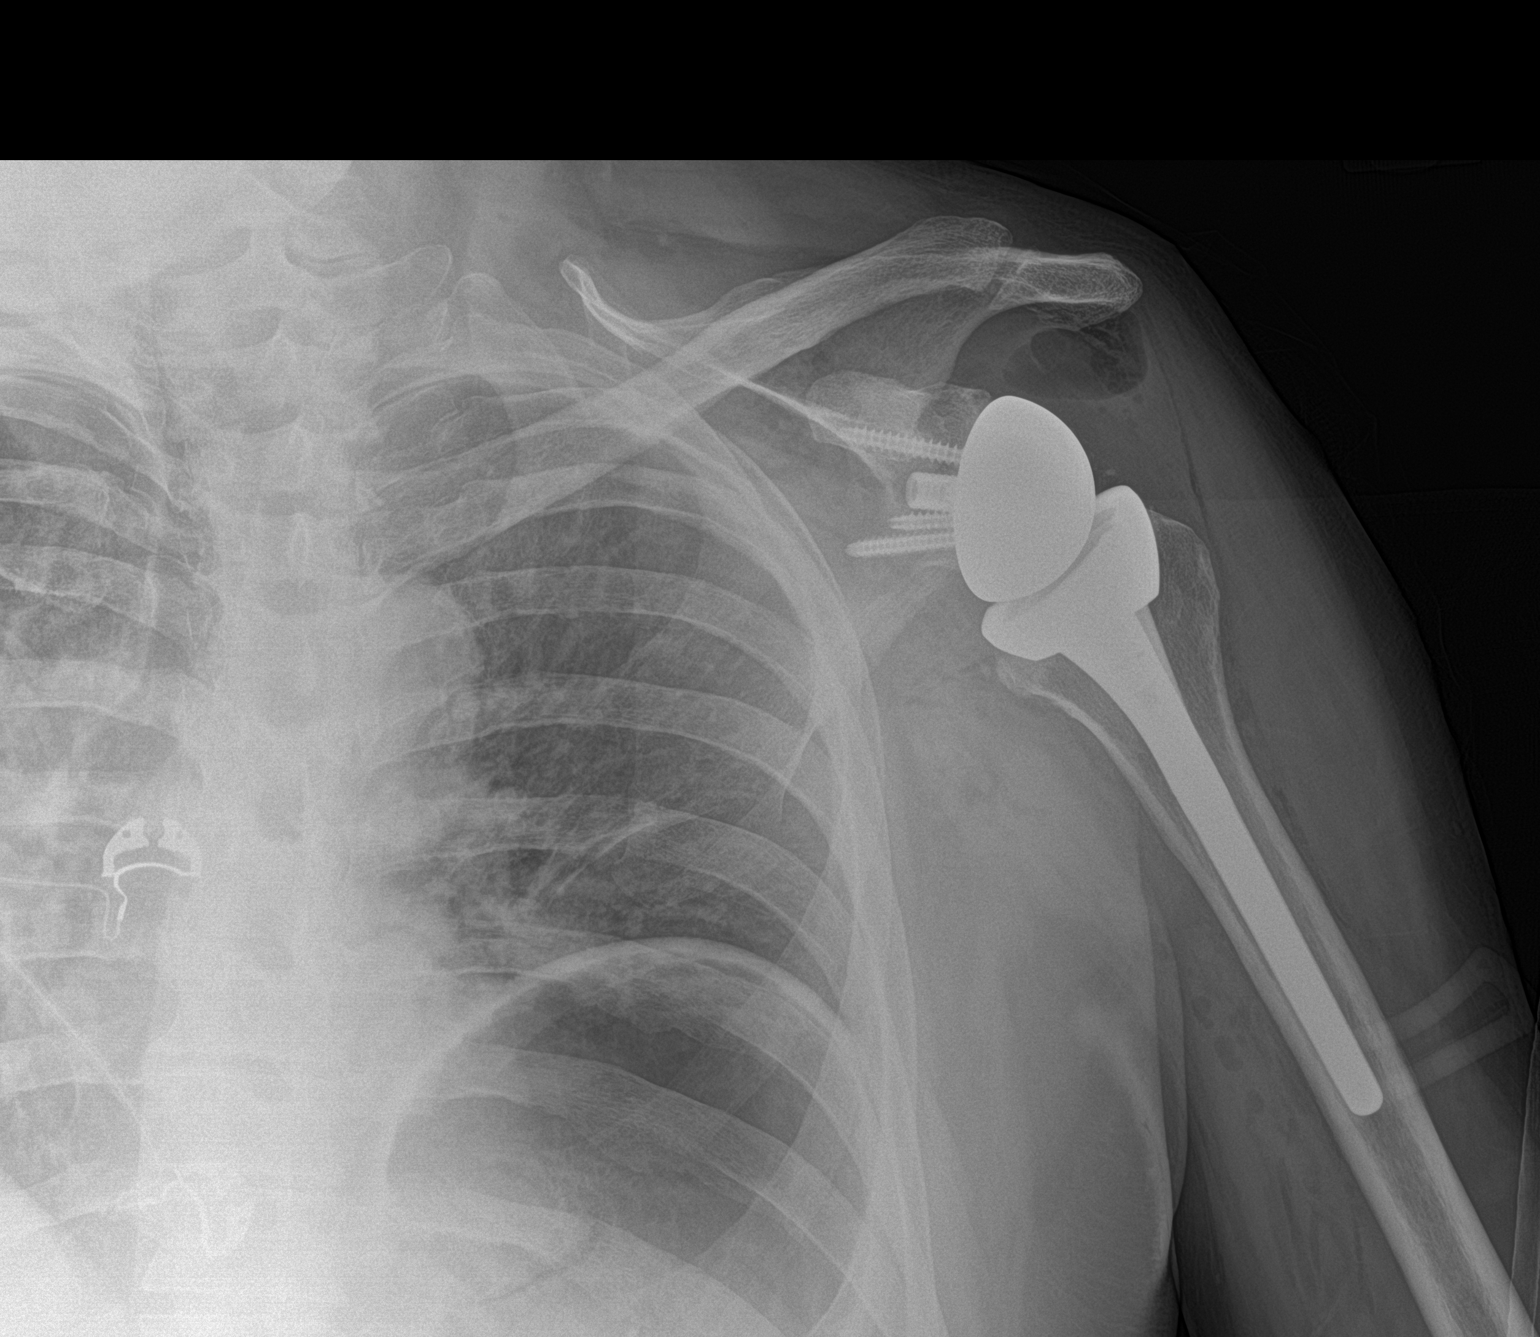

[1 of 1 positions shown; findings below may reference images not displayed]

FINDINGS: Left total shoulder arthroplasty appears located on this single
view. Expected lucency in the soft tissues. Chronic elevation of
left hemidiaphragm. Normal alignment at the left AC joint.
Visualized left ribs are intact. No evidence for periprosthetic
fracture.
IMPRESSION: Expected postoperative changes from left shoulder arthroplasty.

## 2023-03-22 ENCOUNTER — Other Ambulatory Visit: Payer: Self-pay | Admitting: Cardiology

## 2023-04-22 NOTE — Progress Notes (Unsigned)
Cardiology Office Note:    Date:  04/23/2023   ID:  Eugene Taylor, DOB Mar 02, 1951, MRN [id number]  PCP:  Olive Bass, MD  Cardiologist:  [name], MD    Referring MD: Olive Bass, MD    ASSESSMENT:    1. Recurrent idiopathic pericarditis   2. Coronary artery disease of native artery of native heart with stable angina pectoris (HCC)   3. Nonrheumatic aortic valve insufficiency   4. Essential hypertension   5. Mixed hyperlipidemia    PLAN:    In order of problems listed above:  He continues to do well with maintenance ralonatacept therapy I appreciate the interactions of Dr. Lucie Leather and dose adjustment Check C-reactive protein sedimentation rate but no evidence of clinical recurrence Stable CAD he is having no anginal discomfort continue his medical therapy clopidogrel and high intensity statin lipid-lowering treatment Stable valvular heart disease and I think he requires an echocardiogram this visit Blood pressure well-controlled with his ARB will check his renal function Continue his statin will check lipid profile CMP for safety   Next appointment: 32-month follow-up   Medication Adjustments/Labs and Tests Ordered: Current medicines are reviewed at length with the patient today.  Concerns regarding medicines are outlined above.  Orders Placed This Encounter  Procedures   EKG 12-Lead   No orders of the defined types were placed in this encounter.    History of Present Illness:    Eugene Taylor is a 72 y.o. male with a hx of recurrent relapsing pericarditis responding to Ranolacept maintenance therapy CAD PCI and stent to left circumflex March 2020 mild aortic stenosis and regurgitation mild enlargement of the ascending aorta hypertensive heart disease without heart failure and hyperlipidemia last seen 06/20/2022.   Compliance with diet, lifestyle and medications: Yes   Dr. Lucie Leather is reduced the dose of her [place] septin despite that he continues to do  well I think we found maintenance and has had no inflammatory symptoms of chest pain fever chills. 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 and is not having any angina palpitation or syncope and tolerates lipid-lowering therapy without muscle pain or weakness Past Medical History:  Diagnosis Date   Abnormal cardiac CT angiography    Acute idiopathic pericarditis 04/25/2016   Overview:  2017: hosp, EKG changes, colchicine/indomethacin   Acute medial meniscal tear 12/15/2014   Angina pectoris (HCC) 05/05/2018   Aortic regurgitation 07/07/2018   Aortic stenosis, mild 07/07/2018   Arthritis    LEFT SHOULDER   Asthma    pt unsure   Benign hypertension 11/27/2015   Bicuspid aortic valve 05/22/2018   CAD in native artery 11/11/2018   Complication of anesthesia    " during a knee surgery , my blood pressure was up & stayed up "   Coronary artery disease    Coronary artery disease of native artery of native heart with stable angina pectoris (HCC) 11/09/2018   Enlarged thoracic aorta (HCC) 05/22/2018   External hemorrhoid, thrombosed 12/28/2015   Overview:  2017:    Fatty liver    Heart murmur    History of adenomatous polyp of colon    History of kidney stones    Hyperlipidemia    Hypertension    Lower respiratory infection 02/08/2016   Mixed hyperlipidemia 11/27/2015   Obstructive sleep apnea 11/27/2015   OSA on CPAP    uses cpap   Recurrent idiopathic pericarditis 12/23/2017   Right knee meniscal tear    Screening for diabetes mellitus (DM)  11/27/2015   Type 2 diabetes mellitus without complication, without long-term current use of insulin (HCC) 11/27/2015   Formatting of this note might be different from the original. 2019: 107/6.4 2020: 5.9 2020: 130/7.7   Wears glasses     Current Medications: Current Meds  Medication Sig   acetaminophen (TYLENOL) 500 MG tablet Take 500 mg by mouth daily as needed for mild pain or moderate pain (before golfing.).   ARCALYST 220 MG SOLR RECONSTITUTE EACH VIAL WITH  2.3 ML STERILE WATER AND INJECT 2 ML UNDER THE SKIN EVERY WEEK.   b complex vitamins tablet Take 1 tablet by mouth daily.   cholecalciferol (VITAMIN D3) 25 MCG (1000 UT) tablet Take 1,000 Units by mouth daily.   clindamycin (CLEOCIN) 300 MG capsule TAKE 2 CAPSULES BY MOUTH 45 MINUTES PRIOR TO DENTAL VISIT   clopidogrel (PLAVIX) 75 MG tablet Take 1 tablet (75 mg total) by mouth daily.   Colchicine (MITIGARE) 0.6 MG CAPS Take 1 capsule (0.6 mg total) by mouth 2 (two) times daily.   EPINEPHrine 0.3 mg/0.3 mL IJ SOAJ injection Use as directed for life-threatening allergic reaction.   glucose monitoring kit (FREESTYLE) monitoring kit 1 each by Does not apply route as needed for other.   JARDIANCE 25 MG TABS tablet Take 25 mg by mouth daily.   nitroGLYCERIN (NITROSTAT) 0.4 MG SL tablet Place 1 tablet (0.4 mg total) under the tongue every 5 (five) minutes as needed for chest pain.   rosuvastatin (CRESTOR) 20 MG tablet TAKE 1 TABLET(20 MG) BY MOUTH DAILY   telmisartan (MICARDIS) 20 MG tablet Take 1 tablet (20 mg total) by mouth daily.   Water For Injection Sterile (STERILE WATER, PRESERVATIVE FREE,) injection Use as directed with Arcalyst.   Water For Injection Sterile (STERILE WATER, PRESERVATIVE FREE,) injection Inject 10 mLs as directed as directed.      EKGs/Labs/Other Studies Reviewed:    The following studies were reviewed today:  Cardiac Studies & Procedures   CARDIAC CATHETERIZATION  CARDIAC CATHETERIZATION 11/11/2018  Narrative  Prox RCA lesion is 20% stenosed.  Post Atrio lesion is 20% stenosed.  Ost Cx lesion is 35% stenosed.  Prox Cx to Mid Cx lesion is 85% stenosed.  Ost 2nd Mrg to 2nd Mrg lesion is 80% stenosed.  1st Diag lesion is 50% stenosed.  Ost LAD to Prox LAD lesion is 20% stenosed.  Prox LAD lesion is 55% stenosed.  Prox LAD to Mid LAD lesion is 30% stenosed.  Dist LAD lesion is 80% stenosed.  Post intervention, there is a 0% residual stenosis.  A stent  was successfully placed.  Evidence for multivessel coronary calcification with most prominent calcification in the proximal to mid LAD.  The LAD has smooth 20% proximal narrowing.  The first diagonal vessel has a superior branch that has mid narrowing of 50%.  Beyond the first diagonal vessel the LAD has calcification proximally and inferiorly and probable 50 to 60% narrowing followed by 30% irregularity to the mid segment.  The apical portion of the LAD has 80% stenosis.  The left circumflex has 30-40% focal ostial stenosis and has an eccentric 85% stenosis after a small left atrial circumflex branch and before a very small second marginal vessel.  The second marginal vessel has ostial proximal narrowing of 80%.  The RCA is a dominant vessel with mild calcification with 20% proximal narrowing and 20% smooth narrowing in the proximal PLA.  Successful PCI with a Wolverine cutting balloon predilatation and ultimate DES stenting with  a 2.5 x 18 mm Resolute Onyx stent postdilated to approximately 2.5 mm with the 85% stenosis being reduced to 0% and brisk TIMI-3 flow.  RECOMMENDATION: The patient has been on telmisartan HCT for hypertension and was just given a prescription to start rosuvastatin 10 mg.  Since he is never had any edema, consider discontinuing telmisartan HCT and consider possible therapy with amlodipine and beta-blocker therapy for anti-ischemic benefit and possible low dose ARB if additional BP control is needed.  Recommend dual antiplatelet therapy and with significant concomitant CAD for at least 1 year.  Will increase rosuvastatin to 40 mg for optimal LDL management and if unable to reach a target less than 70 consider adding Zetia 10 mg or possible PCSK9 inhibition.  If patient develops recurrent symptomatology on anti-ischemic medication, consider intervention to the mid LAD with possible atherectomy due to superior and inferior calcification.  Findings Coronary Findings Diagnostic   Dominance: Right  Left Anterior Descending Ost LAD to Prox LAD lesion is 20% stenosed. Prox LAD lesion is 55% stenosed. Prox LAD to Mid LAD lesion is 30% stenosed. Dist LAD lesion is 80% stenosed.  First Diagonal Branch 1st Diag lesion is 50% stenosed.  Left Circumflex Ost Cx lesion is 35% stenosed. Prox Cx to Mid Cx lesion is 85% stenosed.  First Obtuse Marginal Branch Vessel is small in size.  Second Obtuse Marginal Branch Vessel is small in size. Ost 2nd Mrg to 2nd Mrg lesion is 80% stenosed.  Right Coronary Artery Prox RCA lesion is 20% stenosed.  Right Posterior Atrioventricular Artery Post Atrio lesion is 20% stenosed.  Intervention  Prox Cx to Mid Cx lesion Stent A stent was successfully placed. Post-Intervention Lesion Assessment The intervention was successful. Pre-interventional TIMI flow is 3. Post-intervention TIMI flow is 3. No complications occurred at this lesion. There is a 0% residual stenosis post intervention.     ECHOCARDIOGRAM  ECHOCARDIOGRAM COMPLETE 07/04/2022  Narrative ECHOCARDIOGRAM REPORT    Patient Name:   Eugene Taylor Date of Exam: 07/04/2022 Medical Rec #:  [id number]       Height:       71.0 in Accession #:    [id number]      Weight:       199.2 lb Date of Birth:  04-10-1951        BSA:          2.105 m Patient Age:    71 years        BP:           123/76 mmHg Patient Gender: M               HR:           73 bpm. Exam Location:  [place]  Procedure: Cardiac Doppler, Color Doppler and Strain Analysis  Indications:    Nonrheumatic aortic valve insufficiency [I35.1 (ICD-10-CM)]  History:        Patient has prior history of Echocardiogram examinations, most recent 06/30/2019. CAD; Risk Factors:Hypertension and Dyslipidemia. Recurrent idiopathic pericarditis. Aortic stenosis, mild.  Sonographer:    Margreta Journey RDCS Referring Phys: [id number] Everleigh Colclasure J Quinto Tippy  IMPRESSIONS   1. TDS. GLS -16.0. Left ventricular ejection  fraction, by estimation, is 60 to 65%. The left ventricle has normal function. The left ventricle has no regional wall motion abnormalities. Left ventricular diastolic parameters are consistent with Grade I diastolic dysfunction (impaired relaxation). 2. Right ventricular systolic function is normal. The right ventricular size is normal. 3. The mitral valve is  normal in structure. No evidence of mitral valve regurgitation. No evidence of mitral stenosis. 4. The aortic valve is calcified. There is moderate calcification of the aortic valve. There is moderate thickening of the aortic valve. Aortic valve regurgitation is mild. Mild aortic valve stenosis. Aortic valve area, by VTI measures 1.36 cm. Aortic valve mean gradient measures 16.5 mmHg. 5. The inferior vena cava is normal in size with greater than 50% respiratory variability, suggesting right atrial pressure of 3 mmHg.  FINDINGS Left Ventricle: TDS. GLS -16.0. Left ventricular ejection fraction, by estimation, is 60 to 65%. The left ventricle has normal function. The left ventricle has no regional wall motion abnormalities. The left ventricular internal cavity size was normal in size. There is no left ventricular hypertrophy. Left ventricular diastolic parameters are consistent with Grade I diastolic dysfunction (impaired relaxation).  Right Ventricle: The right ventricular size is normal. No increase in right ventricular wall thickness. Right ventricular systolic function is normal.  Left Atrium: Left atrial size was normal in size.  Right Atrium: Right atrial size was normal in size.  Pericardium: There is no evidence of pericardial effusion.  Mitral Valve: The mitral valve is normal in structure. No evidence of mitral valve regurgitation. No evidence of mitral valve stenosis.  Tricuspid Valve: The tricuspid valve is normal in structure. Tricuspid valve regurgitation is not demonstrated. No evidence of tricuspid stenosis.  Aortic  Valve: The aortic valve is calcified. There is moderate calcification of the aortic valve. There is moderate thickening of the aortic valve. Aortic valve regurgitation is mild. Mild aortic stenosis is present. Aortic valve mean gradient measures 16.5 mmHg. Aortic valve peak gradient measures 26.4 mmHg. Aortic valve area, by VTI measures 1.36 cm.  Pulmonic Valve: The pulmonic valve was normal in structure. Pulmonic valve regurgitation is not visualized. No evidence of pulmonic stenosis.  Aorta: The aortic root is normal in size and structure.  Venous: The inferior vena cava is normal in size with greater than 50% respiratory variability, suggesting right atrial pressure of 3 mmHg.  IAS/Shunts: No atrial level shunt detected by color flow Doppler.   LEFT VENTRICLE PLAX 2D LVIDd:         5.10 cm   Diastology LVIDs:         4.10 cm   LV e' medial:    6.42 cm/s LV PW:         0.90 cm   LV E/e' medial:  10.2 LV IVS:        0.80 cm   LV e' lateral:   8.38 cm/s LVOT diam:     2.10 cm   LV E/e' lateral: 7.8 LV SV:         70 LV SV Index:   33 LVOT Area:     3.46 cm   RIGHT VENTRICLE            IVC RV S prime:     9.57 cm/s  IVC diam: 2.30 cm TAPSE (M-mode): 2.6 cm  LEFT ATRIUM             Index        RIGHT ATRIUM           Index LA diam:        3.80 cm 1.81 cm/m   RA Area:     13.90 cm LA Vol (A2C):   34.9 ml 16.58 ml/m  RA Volume:   29.50 ml  14.01 ml/m LA Vol (A4C):   30.4 ml 14.44 ml/m  LA Biplane Vol: 34.3 ml 16.29 ml/m AORTIC VALVE AV Area (Vmax):    1.37 cm AV Area (Vmean):   1.32 cm AV Area (VTI):     1.36 cm AV Vmax:           257.00 cm/s AV Vmean:          191.000 cm/s AV VTI:            0.516 m AV Peak Grad:      26.4 mmHg AV Mean Grad:      16.5 mmHg LVOT Vmax:         102.00 cm/s LVOT Vmean:        72.800 cm/s LVOT VTI:          0.202 m LVOT/AV VTI ratio: 0.39  AORTA Ao Root diam: 3.60 cm Ao Asc diam:  3.70 cm Ao Desc diam: 2.10 cm  MITRAL VALVE MV  Area (PHT): 3.60 cm    SHUNTS MV Decel Time: 211 msec    Systemic VTI:  0.20 m MV E velocity: 65.60 cm/s  Systemic Diam: 2.10 cm MV A velocity: 89.20 cm/s MV E/A ratio:  0.74  Gypsy Balsam MD Electronically signed by Gypsy Balsam MD Signature Date/Time: 07/04/2022/5:16:22 PM    Final     CT SCANS  CT CORONARY MORPH W/CTA COR W/SCORE 11/06/2018  Addendum 11/08/2018  9:57 AM ADDENDUM REPORT: 11/08/2018 09:55  CLINICAL DATA:  Chest pain  EXAM: CT FFR  MEDICATIONS: No additional medications  TECHNIQUE: The coronary CT was sent for FFR.  FINDINGS: FFR 0.62 distal LCx  FFR 0.67 mid LAD  FFR 0.66 mid D1  FFR 0.84 proximal PDA  IMPRESSION: There is evidence for hemodynamically significant stenosis in the proximal LAD, ostial/proximal D1, and mid LCx.  Dalton Mclean   Electronically Signed By: Marca Ancona M.D. On: 11/08/2018 09:55  Addendum 11/06/2018  5:48 PM ADDENDUM REPORT: 11/06/2018 17:45  CLINICAL DATA:  Chest pain  EXAM: Cardiac CTA  MEDICATIONS: Sub lingual nitro. 4mg  x 2 and lopressor mg  TECHNIQUE: The patient was scanned on a Siemens 192 slice scanner. Gantry rotation speed was 250 msecs. Collimation was 0.6 mm. A 100 kV prospective scan was triggered in the ascending thoracic aorta at 35-75% of the R-R interval. Average HR during the scan was 60 bpm. The 3D data set was interpreted on a dedicated work station using MPR, MIP and VRT modes. A total of 80cc of contrast was used.  FINDINGS: Non-cardiac: See separate report from [facility].  The pulmonary veins drain normally to the left atrium.  Calcium Score: 782 Agatston units.  Coronary Arteries: Right dominant with no anomalies  LM: No plaque or stenosis.  LAD system: The LAD gives rise to a large 1st diagonal. The proximal LAD and D1 are heavily involved by mixed plaque. Concern for severe (70-90%) stenosis in proximal LAD just beyond D1 ostium and in proximal  D1.  Circumflex system: Extensive mixed plaque proximal and mid LCx. Possible severe (70-90%) stenosis.  RCA system: Mixed plaque in proximal, mid, and distal RCA. Suspect mild (<50%) stenosis in the proximal and mid RCA. Ostial PDA with mixed plaque, possible severe (70-90%) stenosis.  IMPRESSION: 1. Coronary artery calcium score 782 Agatston units, placing the patient in the 89th percentile for age and gender, suggesting high risk for future cardiac events.  2. Concern for significant stenosis in the ostial PDA, proximal LAD, proximal D1, and proximal LCx. Will send for FFR.  Dalton [employer]  Electronically Signed By: Marca Ancona M.D. On: 11/06/2018 17:45  Narrative EXAM: OVER-READ INTERPRETATION  CT CHEST  The following report is an over-read performed by radiologist Dr. Trudie Reed of [facility] on 11/06/2018. This over-read does not include interpretation of cardiac or coronary anatomy or pathology. The coronary calcium score/coronary CTA interpretation by the cardiologist is attached.  COMPARISON:  None.  FINDINGS: Aortic atherosclerosis. Within the visualized portions of the thorax there are no suspicious appearing pulmonary nodules or masses, there is no acute consolidative airspace disease, no pleural effusions, no pneumothorax and no lymphadenopathy. Visualized portions of the upper abdomen are unremarkable. There are no aggressive appearing lytic or blastic lesions noted in the visualized portions of the skeleton.  IMPRESSION: 1.  Aortic Atherosclerosis (ICD10-I70.0).  Electronically Signed: By: Trudie Reed M.D. On: 11/06/2018 09:24          EKG Interpretation Date/Time:  Wednesday April 23 2023 14:43:54 EDT Ventricular Rate:  73 PR Interval:  168 QRS Duration:  86 QT Interval:  390 QTC Calculation: 429 R Axis:   -1  Text Interpretation: Normal sinus rhythm Minimal voltage criteria for LVH, may be normal variant ( R in  aVL ) Nonspecific T wave abnormality When compared with ECG of 12-Nov-2018 05:37, ST now depressed in Lateral leads T wave inversion now evident in Lateral leads Confirmed by [name] ([id number]) on 04/23/2023 2:50:33 PM   Recent Labs: 07/10/2022: ALT 21; BUN 20; Creatinine, Ser 1.10; Hemoglobin 17.5; Potassium 4.4; Sodium 140  Recent Lipid Panel    Component Value Date/Time   CHOL 166 06/20/2022 0855   TRIG 206 (H) 06/20/2022 0855   HDL 46 06/20/2022 0855   CHOLHDL 3.6 06/20/2022 0855   LDLCALC 85 06/20/2022 0855    Physical Exam:    VS:  BP 138/78 (BP Location: Right Arm, Patient Position: Sitting, Cuff Size: Normal)   Pulse 73   Ht 5\' 10"  (1.778 m)   Wt 202 lb 6.4 oz (91.8 kg)   SpO2 92%   BMI 29.04 kg/m     Wt Readings from Last 3 Encounters:  04/23/23 202 lb 6.4 oz (91.8 kg)  07/08/22 199 lb 3.2 oz (90.4 kg)  06/20/22 199 lb 3.2 oz (90.4 kg)     GEN:  Well nourished, well developed in no acute distress HEENT: Normal NECK: No JVD; No carotid bruits LYMPHATICS: No lymphadenopathy CARDIAC: RRR, no murmurs, rubs, gallops there is no rub RESPIRATORY:  Clear to auscultation without rales, wheezing or rhonchi  ABDOMEN: Soft, non-tender, non-distended MUSCULOSKELETAL:  No edema; No deformity  SKIN: Warm and dry NEUROLOGIC:  Alert and oriented x 3 PSYCHIATRIC:  Normal affect    Signed, [name], MD  04/23/2023 3:04 PM    [facility]

## 2023-04-23 ENCOUNTER — Encounter: Payer: Self-pay | Admitting: Cardiology

## 2023-04-23 ENCOUNTER — Ambulatory Visit: Payer: Medicare PPO | Attending: Cardiology | Admitting: Cardiology

## 2023-04-23 VITALS — BP 138/78 | HR 73 | Ht 70.0 in | Wt 202.4 lb

## 2023-04-23 DIAGNOSIS — I300 Acute nonspecific idiopathic pericarditis: Secondary | ICD-10-CM | POA: Diagnosis not present

## 2023-04-23 DIAGNOSIS — I351 Nonrheumatic aortic (valve) insufficiency: Secondary | ICD-10-CM | POA: Diagnosis not present

## 2023-04-23 DIAGNOSIS — I10 Essential (primary) hypertension: Secondary | ICD-10-CM

## 2023-04-23 DIAGNOSIS — E782 Mixed hyperlipidemia: Secondary | ICD-10-CM

## 2023-04-23 DIAGNOSIS — I25118 Atherosclerotic heart disease of native coronary artery with other forms of angina pectoris: Secondary | ICD-10-CM

## 2023-04-23 NOTE — Patient Instructions (Signed)
Medication Instructions:  Your physician recommends that you continue on your current medications as directed. Please refer to the Current Medication list given to you today.  *If you need a refill on your cardiac medications before your next appointment, please call your pharmacy*   Lab Work: Your physician recommends that you return for lab work in:   Labs today: Sed rate, CRP, CBC, CMP  If you have labs (blood work) drawn today and your tests are completely normal, you will receive your results only by: MyChart Message (if you have MyChart) OR A paper copy in the mail If you have any lab test that is abnormal or we need to change your treatment, we will call you to review the results.   Testing/Procedures: None   Follow-Up: At [facility], you and your health needs are our priority.  As part of our continuing mission to provide you with exceptional heart care, we have created designated Provider Care Teams.  These Care Teams include your primary Cardiologist (physician) and Advanced Practice Providers (APPs -  Physician Assistants and Nurse Practitioners) who all work together to provide you with the care you need, when you need it.  We recommend signing up for the patient portal called "MyChart".  Sign up information is provided on this After Visit Summary.  MyChart is used to connect with patients for Virtual Visits (Telemedicine).  Patients are able to view lab/test results, encounter notes, upcoming appointments, etc.  Non-urgent messages can be sent to your provider as well.   To learn more about what you can do with MyChart, go to [email or web address].    Your next appointment:   6 month(s)  Provider:   [name], MD    Other Instructions None

## 2023-04-24 LAB — COMPREHENSIVE METABOLIC PANEL
ALT: 25 IU/L (ref 0–44)
AST: 28 IU/L (ref 0–40)
Albumin: 4.2 g/dL (ref 3.9–4.9)
Alkaline Phosphatase: 86 IU/L (ref 44–121)
BUN/Creatinine Ratio: 18 (ref 10–24)
BUN: 20 mg/dL (ref 8–27)
Bilirubin Total: 0.5 mg/dL (ref 0.0–1.2)
CO2: 19 mmol/L — ABNORMAL LOW (ref 20–29)
Calcium: 9.4 mg/dL (ref 8.6–10.2)
Chloride: 103 mmol/L (ref 96–106)
Creatinine, Ser: 1.14 mg/dL (ref 0.76–1.27)
Globulin, Total: 2.4 g/dL (ref 1.5–4.5)
Glucose: 122 mg/dL — ABNORMAL HIGH (ref 70–99)
Potassium: 4.1 mmol/L (ref 3.5–5.2)
Sodium: 140 mmol/L (ref 134–144)
Total Protein: 6.6 g/dL (ref 6.0–8.5)
eGFR: 69 mL/min/{1.73_m2} (ref >59)

## 2023-04-24 LAB — CBC
Hematocrit: 48.5 % (ref 37.5–51.0)
Hemoglobin: 16.6 g/dL (ref 13.0–17.7)
MCH: 30.6 pg (ref 26.6–33.0)
MCHC: 34.2 g/dL (ref 31.5–35.7)
MCV: 89 fL (ref 79–97)
Platelets: 206 10*3/uL (ref 150–450)
RBC: 5.43 x10E6/uL (ref 4.14–5.80)
RDW: 13.6 % (ref 11.6–15.4)
WBC: 5.1 10*3/uL (ref 3.4–10.8)

## 2023-04-24 LAB — SEDIMENTATION RATE: Sed Rate: 24 mm/h (ref 0–30)

## 2023-04-24 LAB — C-REACTIVE PROTEIN: CRP: 1 mg/L (ref 0–10)

## 2023-05-30 ENCOUNTER — Other Ambulatory Visit: Payer: Self-pay | Admitting: Cardiology

## 2023-05-30 NOTE — Telephone Encounter (Signed)
Rx refill sent to pharmacy. 

## 2023-06-02 DIAGNOSIS — S6701XA Crushing injury of right thumb, initial encounter: Secondary | ICD-10-CM | POA: Insufficient documentation

## 2023-06-02 HISTORY — DX: Crushing injury of right thumb, initial encounter: S67.01XA

## 2023-08-21 DIAGNOSIS — J019 Acute sinusitis, unspecified: Secondary | ICD-10-CM | POA: Insufficient documentation

## 2023-08-21 HISTORY — DX: Acute sinusitis, unspecified: J01.90

## 2023-09-09 ENCOUNTER — Other Ambulatory Visit: Payer: Self-pay | Admitting: Cardiology

## 2023-09-10 NOTE — Telephone Encounter (Signed)
 Rx refill sent to pharmacy.

## 2023-10-09 ENCOUNTER — Ambulatory Visit: Payer: Medicare PPO | Admitting: Allergy and Immunology

## 2023-10-09 ENCOUNTER — Other Ambulatory Visit: Payer: Self-pay

## 2023-10-09 ENCOUNTER — Encounter: Payer: Self-pay | Admitting: Allergy and Immunology

## 2023-10-09 VITALS — BP 138/84 | HR 78 | Temp 98.2°F | Resp 18 | Ht 68.5 in | Wt 203.6 lb

## 2023-10-09 DIAGNOSIS — E1129 Type 2 diabetes mellitus with other diabetic kidney complication: Secondary | ICD-10-CM

## 2023-10-09 DIAGNOSIS — R809 Proteinuria, unspecified: Secondary | ICD-10-CM | POA: Diagnosis not present

## 2023-10-09 DIAGNOSIS — I300 Acute nonspecific idiopathic pericarditis: Secondary | ICD-10-CM | POA: Diagnosis not present

## 2023-10-09 DIAGNOSIS — T7800XD Anaphylactic reaction due to unspecified food, subsequent encounter: Secondary | ICD-10-CM

## 2023-10-09 DIAGNOSIS — T7800XA Anaphylactic reaction due to unspecified food, initial encounter: Secondary | ICD-10-CM

## 2023-10-09 MED ORDER — EPINEPHRINE 0.3 MG/0.3ML IJ SOAJ: 0.3000 mg | INTRAMUSCULAR | 2 refills | Status: AC | PRN

## 2023-10-09 NOTE — Progress Notes (Signed)
  - [place] - [place] - [place] [facility] [place]   Follow-up Note  Referring Provider: Olive Bass, MD Primary Provider: Olive Bass, MD Date of Office Visit: 10/09/2023  Subjective:   Eugene Taylor (DOB: September 30, 1950) is a 73 y.o. male who [facility] in re-evaluation of the following:  HPI: Eugene Taylor returns to this clinic in evaluation of alpha gal syndrome and recurrent pericarditis.  I last saw him in this clinic 08 July 2022.  He has not had any episodes of recurrent pericarditis or chest pain since starting Arcalyst.  He has had only 2 infections over the course of the past year both with upper respiratory tract involvement that were treated with antibiotics successfully.  He did unfortunately develop a Bactrim induced rash requiring prednisone in December 2024.  He does not eat any mammal.  He has been having some issues with his diabetes.  He is on Jardiance yet still continues to have high hemoglobin A1c's and most recently has had albumin detected in his urine.  Allergies as of 10/09/2023       Reactions   Other Anaphylaxis   Red Meat   Penicillins Other (See Comments)   Did it involve swelling of the face/tongue/throat, SOB, or low BP? Unknown Did it involve sudden or severe rash/hives, skin peeling, or any reaction on the inside of your mouth or nose? Unknown Did you need to seek medical attention at a hospital or doctor's office? Unknown When did it last happen?      Childhood allergy If all above answers are "NO", may proceed with cephalosporin use.   Hydrocodeine [dihydrocodeine] Itching        Medication List    acetaminophen 500 MG tablet Commonly known as: TYLENOL Take 500 mg by mouth daily as needed for mild pain or moderate pain (before golfing.).   Arcalyst 220 MG injection Generic drug: rilonacept RECONSTITUTE EACH VIAL WITH 2.3 ML STERILE WATER AND INJECT 2 ML UNDER THE SKIN EVERY  WEEK.   b complex vitamins tablet Take 1 tablet by mouth daily.   cholecalciferol 25 MCG (1000 UNIT) tablet Commonly known as: VITAMIN D3 Take 1,000 Units by mouth daily.   clindamycin 300 MG capsule Commonly known as: CLEOCIN TAKE 2 CAPSULES BY MOUTH 45 MINUTES PRIOR TO DENTAL VISIT   clopidogrel 75 MG tablet Commonly known as: PLAVIX Take 1 tablet (75 mg total) by mouth daily.   Colchicine 0.6 MG Caps Commonly known as: Mitigare Take 1 capsule (0.6 mg total) by mouth 2 (two) times daily.   EPINEPHrine 0.3 mg/0.3 mL Soaj injection Commonly known as: EPI-PEN Use as directed for life-threatening allergic reaction.   glucose monitoring kit monitoring kit 1 each by Does not apply route as needed for other.   Jardiance 25 MG Tabs tablet Generic drug: empagliflozin Take 25 mg by mouth daily.   nitroGLYCERIN 0.4 MG SL tablet Commonly known as: Nitrostat Place 1 tablet (0.4 mg total) under the tongue every 5 (five) minutes as needed for chest pain.   rosuvastatin 20 MG tablet Commonly known as: CRESTOR TAKE 1 TABLET(20 MG) BY MOUTH DAILY   sterile water (preservative free) injection Use as directed with Arcalyst.   sterile water (preservative free) injection Inject 10 mLs as directed as directed.   telmisartan 20 MG tablet Commonly known as: MICARDIS Take 1 tablet (20 mg total) by mouth daily.    Past Medical History:  Diagnosis Date   Abnormal  cardiac CT angiography    Acute idiopathic pericarditis 04/25/2016   Overview:  2017: hosp, EKG changes, colchicine/indomethacin   Acute medial meniscal tear 12/15/2014   Angina pectoris (HCC) 05/05/2018   Aortic regurgitation 07/07/2018   Aortic stenosis, mild 07/07/2018   Arthritis    LEFT SHOULDER   Asthma    pt unsure   Benign hypertension 11/27/2015   Bicuspid aortic valve 05/22/2018   CAD in native artery 11/11/2018   Complication of anesthesia    " during a knee surgery , my blood pressure was up & stayed up "    Coronary artery disease    Coronary artery disease of native artery of native heart with stable angina pectoris (HCC) 11/09/2018   Enlarged thoracic aorta (HCC) 05/22/2018   External hemorrhoid, thrombosed 12/28/2015   Overview:  2017:    Fatty liver    Heart murmur    History of adenomatous polyp of colon    History of kidney stones    Hyperlipidemia    Hypertension    Lower respiratory infection 02/08/2016   Mixed hyperlipidemia 11/27/2015   Obstructive sleep apnea 11/27/2015   OSA on CPAP    uses cpap   Recurrent idiopathic pericarditis 12/23/2017   Right knee meniscal tear    Screening for diabetes mellitus (DM) 11/27/2015   Type 2 diabetes mellitus without complication, without long-term current use of insulin (HCC) 11/27/2015   Formatting of this note might be different from the original. 2019: 107/6.4 2020: 5.9 2020: 130/7.7   Wears glasses     Past Surgical History:  Procedure Laterality Date   APPENDECTOMY  age 11   CARDIAC CATHETERIZATION  11/11/2018   CHONDROPLASTY Right 12/15/2014   Procedure: CHONDROPLASTY;  Surgeon: Ollen Gross, MD;  Location: [facility];  Service: Orthopedics;  Laterality: Right;   COLONOSCOPY W/ POLYPECTOMY  2011   CORONARY STENT INTERVENTION  11/11/2018   CORONARY STENT INTERVENTION N/A 11/11/2018   Procedure: CORONARY STENT INTERVENTION;  Surgeon: Lennette Bihari, MD;  Location: [facility];  Service: Cardiovascular;  Laterality: N/A;   CYSTO/  URETEROSCOPIC STONE EXTRACTIONS  1995   KNEE ARTHROSCOPY WITH LATERAL MENISECTOMY Right 12/15/2014   Procedure: KNEE ARTHROSCOPY WITH LATERAL MENISECTOMY;  Surgeon: Ollen Gross, MD;  Location: [facility];  Service: Orthopedics;  Laterality: Right;   KNEE ARTHROSCOPY WITH MEDIAL MENISECTOMY Right 12/15/2014   Procedure: KNEE ARTHROSCOPY WITH MEDIAL MENISECTOMY;  Surgeon: Ollen Gross, MD;  Location: [facility];  Service: Orthopedics;  Laterality: Right;   LEFT  HEART CATH AND CORONARY ANGIOGRAPHY N/A 11/11/2018   Procedure: LEFT HEART CATH AND CORONARY ANGIOGRAPHY;  Surgeon: Lennette Bihari, MD;  Location: [facility];  Service: Cardiovascular;  Laterality: N/A;   NASAL SEPTUM SURGERY  1995   REVERSE SHOULDER ARTHROPLASTY Left 11/24/2020   Procedure: REVERSE SHOULDER ARTHROPLASTY;  Surgeon: Beverely Low, MD;  Location: [facility];  Service: Orthopedics;  Laterality: Left;  interscalene block   TOTAL KNEE ARTHROPLASTY Right     Review of systems negative except as noted in HPI / PMHx or noted below:  Review of Systems  Constitutional: Negative.   HENT: Negative.    Eyes: Negative.   Respiratory: Negative.    Cardiovascular: Negative.   Gastrointestinal: Negative.   Genitourinary: Negative.   Musculoskeletal: Negative.   Skin: Negative.   Neurological: Negative.   Endo/Heme/Allergies: Negative.   Psychiatric/Behavioral: Negative.       Objective:   Vitals:   10/09/23  1522  BP: 138/84  Pulse: 78  Resp: 18  Temp: 98.2 F (36.8 C)  SpO2: 97%   Height: 5' 8.5" (174 cm)  Weight: 203 lb 9.6 oz (92.4 kg)   Physical Exam Constitutional:      Appearance: He is not diaphoretic.  HENT:     Head: Normocephalic.     Right Ear: Tympanic membrane, ear canal and external ear normal.     Left Ear: Tympanic membrane, ear canal and external ear normal.     Nose: Nose normal. No mucosal edema or rhinorrhea.     Mouth/Throat:     Pharynx: Uvula midline. No oropharyngeal exudate.  Eyes:     Conjunctiva/sclera: Conjunctivae normal.  Neck:     Thyroid: No thyromegaly.     Trachea: Trachea normal. No tracheal tenderness or tracheal deviation.  Cardiovascular:     Rate and Rhythm: Normal rate and regular rhythm.     Heart sounds: S1 normal and S2 normal. Murmur (systolic) heard.  Pulmonary:     Effort: No respiratory distress.     Breath sounds: Normal breath sounds. No stridor. No wheezing or rales.  Lymphadenopathy:     Head:     Right  side of head: No tonsillar adenopathy.     Left side of head: No tonsillar adenopathy.     Cervical: No cervical adenopathy.  Skin:    Findings: No erythema or rash.     Nails: There is no clubbing.  Neurological:     Mental Status: He is alert.     Diagnostics: Results of blood tests obtained 03 October 2023 identifies creatinine 1.19 Mg/DL, AST 09W/J, ALT 19J/Y,  Assessment and Plan:   1. Recurrent idiopathic pericarditis   2. Allergy with anaphylaxis due to food   3. Type 2 diabetes mellitus with diabetic microalbuminuria, without long-term current use of insulin (HCC)    1.  Allergen avoidance measures - mammal consumption  2.  EpiPen, Benadryl, MD/ER evaluation for allergic reaction  3.  Continue rilonacept injections - attempt every other week administration  4.  Obtain blood - cbc w/d  5. Discuss with Dr. Sol Passer about using [facility] given HgA1c=6.7 and urine albumin  5. Return to clinic in 1 year or earlier if problem.  6. Influenza = Tamiflu. Covid = Paxlovid  Ferris will now expand his interval of rilonacept administration to every 2 weeks.  We will check a CBC with differential as monitoring test.  He will continue to avoid mammal consumption.  I have asked him to discuss with Dr. Debroah Loop about possibly starting [facility] given his hemoglobin A1c of 6.7 while using Jardiance and the detection of urinary albumin.  I will see him back in this clinic in 1 year.  Laurette Schimke, MD Allergy / Immunology [facility]

## 2023-10-09 NOTE — Patient Instructions (Addendum)
  1.  Allergen avoidance measures - mammal consumption  2.  EpiPen, Benadryl, MD/ER evaluation for allergic reaction  3.  Continue rilonacept injections - attempt every other week administration  4.  Obtain blood - cbc w/d  5. Discuss with Dr. Sol Passer about using [facility] given HgA1c=6.7 and urine albumin  5. Return to clinic in 1 year or earlier if problem.  6. Influenza = Tamiflu. Covid = Paxlovid

## 2023-10-10 LAB — CBC WITH DIFFERENTIAL/PLATELET
Basophils Absolute: 0 10*3/uL (ref 0.0–0.2)
Basos: 1 %
EOS (ABSOLUTE): 0.1 10*3/uL (ref 0.0–0.4)
Eos: 2 %
Hematocrit: 52 % — ABNORMAL HIGH (ref 37.5–51.0)
Hemoglobin: 17.2 g/dL (ref 13.0–17.7)
Immature Grans (Abs): 0 10*3/uL (ref 0.0–0.1)
Immature Granulocytes: 0 %
Lymphocytes Absolute: 2 10*3/uL (ref 0.7–3.1)
Lymphs: 39 %
MCH: 30.1 pg (ref 26.6–33.0)
MCHC: 33.1 g/dL (ref 31.5–35.7)
MCV: 91 fL (ref 79–97)
Monocytes Absolute: 0.5 10*3/uL (ref 0.1–0.9)
Monocytes: 10 %
Neutrophils Absolute: 2.5 10*3/uL (ref 1.4–7.0)
Neutrophils: 48 %
Platelets: 197 10*3/uL (ref 150–450)
RBC: 5.71 x10E6/uL (ref 4.14–5.80)
RDW: 13.9 % (ref 11.6–15.4)
WBC: 5.2 10*3/uL (ref 3.4–10.8)

## 2023-10-13 ENCOUNTER — Encounter: Payer: Self-pay | Admitting: Allergy and Immunology

## 2023-10-13 ENCOUNTER — Encounter: Payer: Self-pay | Admitting: *Deleted

## 2023-10-22 ENCOUNTER — Encounter: Payer: Self-pay | Admitting: Cardiology

## 2023-10-22 NOTE — Progress Notes (Unsigned)
 Cardiology Office Note:    Date:  10/23/2023   ID:  Eugene Taylor, DOB 01/11/1951, MRN [id number]  PCP:  Olive Bass, MD  Cardiologist:  [name], MD    Referring MD: Olive Bass, MD    ASSESSMENT:    1. Recurrent idiopathic pericarditis   2. Coronary artery disease of native artery of native heart with stable angina pectoris (HCC)   3. Nonrheumatic aortic valve insufficiency   4. Aortic stenosis, mild   5. Hypertensive heart disease without heart failure   6. Enlarged thoracic aorta (HCC)   7. Mixed hyperlipidemia   8. Type 2 diabetes mellitus without complication, without long-term current use of insulin (HCC)    PLAN:    In order of problems listed above:  Eugene Taylor continues to do well no recurrence of his pericarditis remains on anti-inflammatory therapy with rilonacept along with colchicine. I appreciate the guidance of allergy and immunology on dosing and frequency Recheck his inflammatory markers as well as a echocardiogram he had some pericardial scarring on MRI Stable CAD after PCI and stent having no anginal discomfort continue long-term clopidogrel along with lipid-lowering therapy rosuvastatin recent lipids at target Stable valve disease will recheck echocardiogram for gradients and progression Hypertension is at target continue his current therapy at this time he does not require antihypertensive agent other than ARB Micardis Will recheck his aorta echocardiogram Continue his current statin lipids are at target A1c today at his request I will send a copy to his PCP   Next appointment: 6 months   Medication Adjustments/Labs and Tests Ordered: Current medicines are reviewed at length with the patient today.  Concerns regarding medicines are outlined above.  No orders of the defined types were placed in this encounter.  No orders of the defined types were placed in this encounter.    History of Present Illness:    Eugene Taylor is a 73 y.o.  male with a hx of recurrent idiopathic pericarditis requiring rilonacept therapy for remission being managed by allergy and immunology coronary artery disease with PCI and stent left circumflex 2020 mild aortic stenosis and regurgitation with a mild enlargement ascending aorta hypertensive heart disease without heart failure and hyperlipidemia last seen 04/23/2023  Recent lipids are ideal with a cholesterol 125 triglycerides 171 LDL 53 non-HDL cholesterol 78  Compliance with diet, lifestyle and medications: Yes  From a cardiology perspective he has done well he exercises daily no angina edema shortness of breath chest pain palpitation or syncope He is now on reduced frequency rilonacept a little bit anxious if he will have a recurrence of his pericarditis He asked me to recheck an A1c after recent steroid therapy I will also check inflammatory markers C-reactive protein and sedimentation rate. Past Medical History:  Diagnosis Date   Abnormal cardiac CT angiography    Acute idiopathic pericarditis 04/25/2016   Overview:  2017: hosp, EKG changes, colchicine/indomethacin   Acute medial meniscal tear 12/15/2014   Acute non-recurrent sinusitis 08/21/2023   08/21/2023     Allergy to meat 01/01/2018   Formatting of this note might be different from the original.  2018     Angina pectoris (HCC) 05/05/2018   Aortic regurgitation 07/07/2018   Aortic stenosis, mild 07/07/2018   Arthritis    LEFT SHOULDER   Asthma    pt unsure   Benign hypertension 11/27/2015   Bicuspid aortic valve 05/22/2018   CAD in native artery 11/11/2018   Complication of anesthesia    "  during a knee surgery , my blood pressure was up & stayed up "   Coronary artery disease    Coronary artery disease of native artery of native heart with stable angina pectoris (HCC) 11/09/2018   COVID-19 virus infection 01/16/2021   Formatting of this note might be different from the original.  01/16/2021     Crushing injury of right  thumb 06/02/2023   Enlarged thoracic aorta (HCC) 05/22/2018   External hemorrhoid, thrombosed 12/28/2015   Overview:  2017:    Fatty liver    Folliculitis 09/03/2021   Formatting of this note might be different from the original.  09/03/2021     Heart murmur    History of adenomatous polyp of colon    History of angina 04/30/2018   2019     History of kidney stones    Hyperlipidemia    Hypertension    Lower respiratory infection 02/08/2016   Mixed hyperlipidemia 11/27/2015   Obstructive sleep apnea 11/27/2015   OSA on CPAP    uses cpap   Recurrent idiopathic pericarditis 12/23/2017   Right knee meniscal tear    S/P coronary artery stent placement 09/06/2021   Screening for diabetes mellitus (DM) 11/27/2015   Type 2 diabetes mellitus without complication, without long-term current use of insulin (HCC) 11/27/2015   Formatting of this note might be different from the original. 2019: 107/6.4 2020: 5.9 2020: 130/7.7   Wears glasses     Current Medications: Current Meds  Medication Sig   MOUNJARO 2.5 MG/0.5ML Pen Inject 2.5 mg into the skin once a week.      EKGs/Labs/Other Studies Reviewed:    The following studies were reviewed today:  Cardiac Studies & Procedures   ______________________________________________________________________________________________ CARDIAC CATHETERIZATION  CARDIAC CATHETERIZATION 11/11/2018  Narrative  Prox RCA lesion is 20% stenosed.  Post Atrio lesion is 20% stenosed.  Ost Cx lesion is 35% stenosed.  Prox Cx to Mid Cx lesion is 85% stenosed.  Ost 2nd Mrg to 2nd Mrg lesion is 80% stenosed.  1st Diag lesion is 50% stenosed.  Ost LAD to Prox LAD lesion is 20% stenosed.  Prox LAD lesion is 55% stenosed.  Prox LAD to Mid LAD lesion is 30% stenosed.  Dist LAD lesion is 80% stenosed.  Post intervention, there is a 0% residual stenosis.  A stent was successfully placed.  Evidence for multivessel coronary calcification with most  prominent calcification in the proximal to mid LAD.  The LAD has smooth 20% proximal narrowing.  The first diagonal vessel has a superior branch that has mid narrowing of 50%.  Beyond the first diagonal vessel the LAD has calcification proximally and inferiorly and probable 50 to 60% narrowing followed by 30% irregularity to the mid segment.  The apical portion of the LAD has 80% stenosis.  The left circumflex has 30-40% focal ostial stenosis and has an eccentric 85% stenosis after a small left atrial circumflex branch and before a very small second marginal vessel.  The second marginal vessel has ostial proximal narrowing of 80%.  The RCA is a dominant vessel with mild calcification with 20% proximal narrowing and 20% smooth narrowing in the proximal PLA.  Successful PCI with a Wolverine cutting balloon predilatation and ultimate DES stenting with a 2.5 x 18 mm Resolute Onyx stent postdilated to approximately 2.5 mm with the 85% stenosis being reduced to 0% and brisk TIMI-3 flow.  RECOMMENDATION: The patient has been on telmisartan HCT for hypertension and was just given a prescription to  start rosuvastatin 10 mg.  Since he is never had any edema, consider discontinuing telmisartan HCT and consider possible therapy with amlodipine and beta-blocker therapy for anti-ischemic benefit and possible low dose ARB if additional BP control is needed.  Recommend dual antiplatelet therapy and with significant concomitant CAD for at least 1 year.  Will increase rosuvastatin to 40 mg for optimal LDL management and if unable to reach a target less than 70 consider adding Zetia 10 mg or possible PCSK9 inhibition.  If patient develops recurrent symptomatology on anti-ischemic medication, consider intervention to the mid LAD with possible atherectomy due to superior and inferior calcification.  Findings Coronary Findings Diagnostic  Dominance: Right  Left Anterior Descending Ost LAD to Prox LAD lesion is 20%  stenosed. Prox LAD lesion is 55% stenosed. Prox LAD to Mid LAD lesion is 30% stenosed. Dist LAD lesion is 80% stenosed.  First Diagonal Branch 1st Diag lesion is 50% stenosed.  Left Circumflex Ost Cx lesion is 35% stenosed. Prox Cx to Mid Cx lesion is 85% stenosed.  First Obtuse Marginal Branch Vessel is small in size.  Second Obtuse Marginal Branch Vessel is small in size. Ost 2nd Mrg to 2nd Mrg lesion is 80% stenosed.  Right Coronary Artery Prox RCA lesion is 20% stenosed.  Right Posterior Atrioventricular Artery Post Atrio lesion is 20% stenosed.  Intervention  Prox Cx to Mid Cx lesion Stent A stent was successfully placed. Post-Intervention Lesion Assessment The intervention was successful. Pre-interventional TIMI flow is 3. Post-intervention TIMI flow is 3. No complications occurred at this lesion. There is a 0% residual stenosis post intervention.     ECHOCARDIOGRAM  ECHOCARDIOGRAM COMPLETE 07/04/2022  Narrative ECHOCARDIOGRAM REPORT    Patient Name:   Eugene Taylor Date of Exam: 07/04/2022 Medical Rec #:  [id number]       Height:       71.0 in Accession #:    [id number]      Weight:       199.2 lb Date of Birth:  July 30, 1951        BSA:          2.105 m Patient Age:    71 years        BP:           123/76 mmHg Patient Gender: M               HR:           73 bpm. Exam Location:    Procedure: Cardiac Doppler, Color Doppler and Strain Analysis  Indications:    Nonrheumatic aortic valve insufficiency [I35.1 (ICD-10-CM)]  History:        Patient has prior history of Echocardiogram examinations, most recent 06/30/2019. CAD; Risk Factors:Hypertension and Dyslipidemia. Recurrent idiopathic pericarditis. Aortic stenosis, mild.  Sonographer:    Margreta Journey RDCS Referring Phys: [id number] Chetan Mehring J Vy Badley  IMPRESSIONS   1. TDS. GLS -16.0. Left ventricular ejection fraction, by estimation, is 60 to 65%. The left ventricle has normal function. The  left ventricle has no regional wall motion abnormalities. Left ventricular diastolic parameters are consistent with Grade I diastolic dysfunction (impaired relaxation). 2. Right ventricular systolic function is normal. The right ventricular size is normal. 3. The mitral valve is normal in structure. No evidence of mitral valve regurgitation. No evidence of mitral stenosis. 4. The aortic valve is calcified. There is moderate calcification of the aortic valve. There is moderate thickening of the aortic valve. Aortic valve regurgitation is mild. Mild aortic  valve stenosis. Aortic valve area, by VTI measures 1.36 cm. Aortic valve mean gradient measures 16.5 mmHg. 5. The inferior vena cava is normal in size with greater than 50% respiratory variability, suggesting right atrial pressure of 3 mmHg.  FINDINGS Left Ventricle: TDS. GLS -16.0. Left ventricular ejection fraction, by estimation, is 60 to 65%. The left ventricle has normal function. The left ventricle has no regional wall motion abnormalities. The left ventricular internal cavity size was normal in size. There is no left ventricular hypertrophy. Left ventricular diastolic parameters are consistent with Grade I diastolic dysfunction (impaired relaxation).  Right Ventricle: The right ventricular size is normal. No increase in right ventricular wall thickness. Right ventricular systolic function is normal.  Left Atrium: Left atrial size was normal in size.  Right Atrium: Right atrial size was normal in size.  Pericardium: There is no evidence of pericardial effusion.  Mitral Valve: The mitral valve is normal in structure. No evidence of mitral valve regurgitation. No evidence of mitral valve stenosis.  Tricuspid Valve: The tricuspid valve is normal in structure. Tricuspid valve regurgitation is not demonstrated. No evidence of tricuspid stenosis.  Aortic Valve: The aortic valve is calcified. There is moderate calcification of the aortic  valve. There is moderate thickening of the aortic valve. Aortic valve regurgitation is mild. Mild aortic stenosis is present. Aortic valve mean gradient measures 16.5 mmHg. Aortic valve peak gradient measures 26.4 mmHg. Aortic valve area, by VTI measures 1.36 cm.  Pulmonic Valve: The pulmonic valve was normal in structure. Pulmonic valve regurgitation is not visualized. No evidence of pulmonic stenosis.  Aorta: The aortic root is normal in size and structure.  Venous: The inferior vena cava is normal in size with greater than 50% respiratory variability, suggesting right atrial pressure of 3 mmHg.  IAS/Shunts: No atrial level shunt detected by color flow Doppler.   LEFT VENTRICLE PLAX 2D LVIDd:         5.10 cm   Diastology LVIDs:         4.10 cm   LV e' medial:    6.42 cm/s LV PW:         0.90 cm   LV E/e' medial:  10.2 LV IVS:        0.80 cm   LV e' lateral:   8.38 cm/s LVOT diam:     2.10 cm   LV E/e' lateral: 7.8 LV SV:         70 LV SV Index:   33 LVOT Area:     3.46 cm   RIGHT VENTRICLE            IVC RV S prime:     9.57 cm/s  IVC diam: 2.30 cm TAPSE (M-mode): 2.6 cm  LEFT ATRIUM             Index        RIGHT ATRIUM           Index LA diam:        3.80 cm 1.81 cm/m   RA Area:     13.90 cm LA Vol (A2C):   34.9 ml 16.58 ml/m  RA Volume:   29.50 ml  14.01 ml/m LA Vol (A4C):   30.4 ml 14.44 ml/m LA Biplane Vol: 34.3 ml 16.29 ml/m AORTIC VALVE AV Area (Vmax):    1.37 cm AV Area (Vmean):   1.32 cm AV Area (VTI):     1.36 cm AV Vmax:  257.00 cm/s AV Vmean:          191.000 cm/s AV VTI:            0.516 m AV Peak Grad:      26.4 mmHg AV Mean Grad:      16.5 mmHg LVOT Vmax:         102.00 cm/s LVOT Vmean:        72.800 cm/s LVOT VTI:          0.202 m LVOT/AV VTI ratio: 0.39  AORTA Ao Root diam: 3.60 cm Ao Asc diam:  3.70 cm Ao Desc diam: 2.10 cm  MITRAL VALVE MV Area (PHT): 3.60 cm    SHUNTS MV Decel Time: 211 msec    Systemic VTI:  0.20 m MV  E velocity: 65.60 cm/s  Systemic Diam: 2.10 cm MV A velocity: 89.20 cm/s MV E/A ratio:  0.74  Gypsy Balsam MD Electronically signed by Gypsy Balsam MD Signature Date/Time: 07/04/2022/5:16:22 PM    Final      CT SCANS  CT CORONARY MORPH W/CTA COR W/SCORE 11/06/2018  Addendum 11/08/2018  9:57 AM ADDENDUM REPORT: 11/08/2018 09:55  CLINICAL DATA:  Chest pain  EXAM: CT FFR  MEDICATIONS: No additional medications  TECHNIQUE: The coronary CT was sent for FFR.  FINDINGS: FFR 0.62 distal LCx  FFR 0.67 mid LAD  FFR 0.66 mid D1  FFR 0.84 proximal PDA  IMPRESSION: There is evidence for hemodynamically significant stenosis in the proximal LAD, ostial/proximal D1, and mid LCx.  Dalton Mclean   Electronically Signed By: Marca Ancona M.D. On: 11/08/2018 09:55  Addendum 11/06/2018  5:48 PM ADDENDUM REPORT: 11/06/2018 17:45  CLINICAL DATA:  Chest pain  EXAM: Cardiac CTA  MEDICATIONS: Sub lingual nitro. 4mg  x 2 and lopressor mg  TECHNIQUE: The patient was scanned on a Siemens 192 slice scanner. Gantry rotation speed was 250 msecs. Collimation was 0.6 mm. A 100 kV prospective scan was triggered in the ascending thoracic aorta at 35-75% of the R-R interval. Average HR during the scan was 60 bpm. The 3D data set was interpreted on a dedicated work station using MPR, MIP and VRT modes. A total of 80cc of contrast was used.  FINDINGS: Non-cardiac: See separate report from [facility].  The pulmonary veins drain normally to the left atrium.  Calcium Score: 782 Agatston units.  Coronary Arteries: Right dominant with no anomalies  LM: No plaque or stenosis.  LAD system: The LAD gives rise to a large 1st diagonal. The proximal LAD and D1 are heavily involved by mixed plaque. Concern for severe (70-90%) stenosis in proximal LAD just beyond D1 ostium and in proximal D1.  Circumflex system: Extensive mixed plaque proximal and mid LCx. Possible  severe (70-90%) stenosis.  RCA system: Mixed plaque in proximal, mid, and distal RCA. Suspect mild (<50%) stenosis in the proximal and mid RCA. Ostial PDA with mixed plaque, possible severe (70-90%) stenosis.  IMPRESSION: 1. Coronary artery calcium score 782 Agatston units, placing the patient in the 89th percentile for age and gender, suggesting high risk for future cardiac events.  2. Concern for significant stenosis in the ostial PDA, proximal LAD, proximal D1, and proximal LCx. Will send for FFR.  Dalton Mclean   Electronically Signed By: Marca Ancona M.D. On: 11/06/2018 17:45  Narrative EXAM: OVER-READ INTERPRETATION  CT CHEST  The following report is an over-read performed by radiologist Dr. Trudie Reed of [facility] on 11/06/2018. This over-read does not include interpretation of  cardiac or coronary anatomy or pathology. The coronary calcium score/coronary CTA interpretation by the cardiologist is attached.  COMPARISON:  None.  FINDINGS: Aortic atherosclerosis. Within the visualized portions of the thorax there are no suspicious appearing pulmonary nodules or masses, there is no acute consolidative airspace disease, no pleural effusions, no pneumothorax and no lymphadenopathy. Visualized portions of the upper abdomen are unremarkable. There are no aggressive appearing lytic or blastic lesions noted in the visualized portions of the skeleton.  IMPRESSION: 1.  Aortic Atherosclerosis (ICD10-I70.0).  Electronically Signed: By: Trudie Reed M.D. On: 11/06/2018 09:24     ______________________________________________________________________________________________          Recent Labs: 04/23/2023: ALT 25; BUN 20; Creatinine, Ser 1.14; Potassium 4.1; Sodium 140 10/09/2023: Hemoglobin 17.2; Platelets 197  Recent Lipid Panel    Component Value Date/Time   CHOL 166 06/20/2022 0855   TRIG 206 (H) 06/20/2022 0855   HDL 46 06/20/2022 0855    CHOLHDL 3.6 06/20/2022 0855   LDLCALC 85 06/20/2022 0855    Physical Exam:    VS:  BP 120/76   Pulse 67   Ht 5\' 10"  (1.778 m)   Wt 200 lb 6.4 oz (90.9 kg)   SpO2 94%   BMI 28.75 kg/m     Wt Readings from Last 3 Encounters:  10/23/23 200 lb 6.4 oz (90.9 kg)  10/09/23 203 lb 9.6 oz (92.4 kg)  04/23/23 202 lb 6.4 oz (91.8 kg)     GEN:  Well nourished, well developed in no acute distress HEENT: Normal NECK: No JVD; No carotid bruits LYMPHATICS: No lymphadenopathy CARDIAC: Grade 1/6 systolic ejection murmur does not encompass S2 no aortic regurgitation RRR,  RESPIRATORY:  Clear to auscultation without rales, wheezing or rhonchi  ABDOMEN: Soft, non-tender, non-distended MUSCULOSKELETAL:  No edema; No deformity  SKIN: Warm and dry NEUROLOGIC:  Alert and oriented x 3 PSYCHIATRIC:  Normal affect    Signed, [name], MD  10/23/2023 9:33 AM     [facility]

## 2023-10-23 ENCOUNTER — Ambulatory Visit: Payer: Medicare PPO | Attending: Cardiology | Admitting: Cardiology

## 2023-10-23 ENCOUNTER — Encounter: Payer: Self-pay | Admitting: Cardiology

## 2023-10-23 VITALS — BP 120/76 | HR 67 | Ht 70.0 in | Wt 200.4 lb

## 2023-10-23 DIAGNOSIS — I350 Nonrheumatic aortic (valve) stenosis: Secondary | ICD-10-CM

## 2023-10-23 DIAGNOSIS — I119 Hypertensive heart disease without heart failure: Secondary | ICD-10-CM

## 2023-10-23 DIAGNOSIS — I351 Nonrheumatic aortic (valve) insufficiency: Secondary | ICD-10-CM

## 2023-10-23 DIAGNOSIS — I300 Acute nonspecific idiopathic pericarditis: Secondary | ICD-10-CM | POA: Diagnosis not present

## 2023-10-23 DIAGNOSIS — E782 Mixed hyperlipidemia: Secondary | ICD-10-CM

## 2023-10-23 DIAGNOSIS — I7789 Other specified disorders of arteries and arterioles: Secondary | ICD-10-CM

## 2023-10-23 DIAGNOSIS — I25118 Atherosclerotic heart disease of native coronary artery with other forms of angina pectoris: Secondary | ICD-10-CM | POA: Diagnosis not present

## 2023-10-23 DIAGNOSIS — E119 Type 2 diabetes mellitus without complications: Secondary | ICD-10-CM

## 2023-10-23 NOTE — Patient Instructions (Signed)
 Medication Instructions:  Your physician recommends that you continue on your current medications as directed. Please refer to the Current Medication list given to you today.  *If you need a refill on your cardiac medications before your next appointment, please call your pharmacy*   Lab Work: Your physician recommends that you return for lab work in:   Labs today: CRP, Sed Rate, Hbg A1c  If you have labs (blood work) drawn today and your tests are completely normal, you will receive your results only by: MyChart Message (if you have MyChart) OR A paper copy in the mail If you have any lab test that is abnormal or we need to change your treatment, we will call you to review the results.   Testing/Procedures: Your physician has requested that you have an echocardiogram. Echocardiography is a painless test that uses sound waves to create images of your heart. It provides your doctor with information about the size and shape of your heart and how well your heart's chambers and valves are working. This procedure takes approximately one hour. There are no restrictions for this procedure. Please do NOT wear cologne, perfume, aftershave, or lotions (deodorant is allowed). Please arrive 15 minutes prior to your appointment time.  Please note: We ask at that you not bring children with you during ultrasound (echo/ vascular) testing. Due to room size and safety concerns, children are not allowed in the ultrasound rooms during exams. Our front office staff cannot provide observation of children in our lobby area while testing is being conducted. An adult accompanying a patient to their appointment will only be allowed in the ultrasound room at the discretion of the ultrasound technician under special circumstances. We apologize for any inconvenience.    Follow-Up: At [facility], you and your health needs are our priority.  As part of our continuing mission to provide you with exceptional  heart care, we have created designated Provider Care Teams.  These Care Teams include your primary Cardiologist (physician) and Advanced Practice Providers (APPs -  Physician Assistants and Nurse Practitioners) who all work together to provide you with the care you need, when you need it.  We recommend signing up for the patient portal called "MyChart".  Sign up information is provided on this After Visit Summary.  MyChart is used to connect with patients for Virtual Visits (Telemedicine).  Patients are able to view lab/test results, encounter notes, upcoming appointments, etc.  Non-urgent messages can be sent to your provider as well.   To learn more about what you can do with MyChart, go to [email or web address].    Your next appointment:   6 month(s)  Provider:   [name], MD    Other Instructions None

## 2023-10-24 LAB — C-REACTIVE PROTEIN: CRP: <1 mg/L (ref 0–10)

## 2023-10-24 LAB — HEMOGLOBIN A1C
Est. average glucose Bld gHb Est-mCnc: 140 mg/dL
Hgb A1c MFr Bld: 6.5 % — ABNORMAL HIGH (ref 4.8–5.6)

## 2023-10-24 LAB — SEDIMENTATION RATE: Sed Rate: 12 mm/h (ref 0–30)

## 2023-10-25 ENCOUNTER — Other Ambulatory Visit: Payer: Self-pay | Admitting: Cardiology

## 2023-11-10 ENCOUNTER — Other Ambulatory Visit: Payer: Self-pay | Admitting: Allergy and Immunology

## 2023-11-14 ENCOUNTER — Ambulatory Visit: Payer: Medicare PPO | Attending: Cardiology

## 2023-11-14 DIAGNOSIS — E119 Type 2 diabetes mellitus without complications: Secondary | ICD-10-CM

## 2023-11-14 DIAGNOSIS — I350 Nonrheumatic aortic (valve) stenosis: Secondary | ICD-10-CM | POA: Diagnosis not present

## 2023-11-14 DIAGNOSIS — I300 Acute nonspecific idiopathic pericarditis: Secondary | ICD-10-CM | POA: Diagnosis not present

## 2023-11-14 DIAGNOSIS — E782 Mixed hyperlipidemia: Secondary | ICD-10-CM

## 2023-11-14 DIAGNOSIS — I25118 Atherosclerotic heart disease of native coronary artery with other forms of angina pectoris: Secondary | ICD-10-CM

## 2023-11-14 DIAGNOSIS — I7789 Other specified disorders of arteries and arterioles: Secondary | ICD-10-CM

## 2023-11-14 DIAGNOSIS — I351 Nonrheumatic aortic (valve) insufficiency: Secondary | ICD-10-CM

## 2023-11-14 DIAGNOSIS — I119 Hypertensive heart disease without heart failure: Secondary | ICD-10-CM

## 2023-11-14 LAB — ECHOCARDIOGRAM COMPLETE
AR max vel: 1.2 cm2
AV Area VTI: 1.37 cm2
AV Area mean vel: 1.18 cm2
AV Mean grad: 18 mmHg
AV Peak grad: 32.8 mmHg
Ao pk vel: 2.87 m/s
Area-P 1/2: 3.61 cm2
S' Lateral: 3.5 cm

## 2023-11-16 ENCOUNTER — Other Ambulatory Visit: Payer: Self-pay | Admitting: Cardiology

## 2023-12-02 ENCOUNTER — Other Ambulatory Visit: Payer: Self-pay | Admitting: Cardiology

## 2024-01-21 ENCOUNTER — Other Ambulatory Visit: Payer: Self-pay | Admitting: Cardiology

## 2024-02-13 ENCOUNTER — Other Ambulatory Visit: Payer: Self-pay | Admitting: Cardiology

## 2024-02-17 ENCOUNTER — Telehealth: Payer: Self-pay | Admitting: Pharmacy Technician

## 2024-02-17 ENCOUNTER — Other Ambulatory Visit (HOSPITAL_COMMUNITY): Payer: Self-pay

## 2024-02-17 NOTE — Telephone Encounter (Signed)
 Pharmacy Patient Advocate Encounter  Received notification from [employer] that Prior Authorization for Colchicine  has been APPROVED from 08/27/23 to 08/25/24. Ran test claim, Copay is $0.00- 3 months. This test claim was processed through [facility]- copay amounts may vary at other pharmacies due to pharmacy/plan contracts, or as the patient moves through the different stages of their insurance plan.   PA #/Case ID/Reference #: [id number]

## 2024-02-17 NOTE — Telephone Encounter (Signed)
 Pharmacy Patient Advocate Encounter   Received notification from CoverMyMeds that prior authorization for Colchicine  0.6 MG caps is required/requested.   Insurance verification completed.   The patient is insured through [employer] .   Per test claim: PA required; PA submitted to above mentioned insurance via CoverMyMeds Key/confirmation #/EOC [id number] Status is pending

## 2024-02-20 ENCOUNTER — Other Ambulatory Visit: Payer: Self-pay | Admitting: Cardiology

## 2024-05-17 NOTE — Progress Notes (Unsigned)
 Cardiology Office Note:    Date:  05/20/2024   ID:  Eugene Taylor, DOB November 28, 1950, MRN [id number]  PCP:  Ofilia Lamar CROME, MD  Cardiologist:  Redell Leiter, MD    Referring MD: Ofilia Lamar CROME, MD    ASSESSMENT:    1. Recurrent idiopathic pericarditis   2. Coronary artery disease of native artery of native heart with stable angina pectoris   3. Nonrheumatic aortic valve insufficiency   4. Hypertensive heart disease without heart failure   5. Mixed hyperlipidemia   6. Type 2 diabetes mellitus without complication, without long-term current use of insulin (HCC)    PLAN:    In order of problems listed above:  On reduced dose rilonacept  he has had no recent clinical recurrence organ to continue indefinite therapy seen by allergy immunology assisting his care and for safety today will check a CBC and inflammatory markers sed rate and CRP Echocardiogram in March did not show pericardial abnormality Stable CAD he is having no anginal discomfort we will continue antiplatelet therapy with long-term clopidogrel  along with his high intensity statin lipids with his PCP are at target Hypertension well-controlled continue current treatment and certainly has benefited from weight loss on ARB Improved diabetes   Next appointment: 6 months   Medication Adjustments/Labs and Tests Ordered: Current medicines are reviewed at length with the patient today.  Concerns regarding medicines are outlined above.  Orders Placed This Encounter  Procedures   Sedimentation rate   C-reactive protein   CBC   EKG 12-Lead   No orders of the defined types were placed in this encounter.    History of Present Illness:    Eugene Taylor is a 73 y.o. male with a hx of  recurrent idiopathic pericarditis requiring rilonacept  therapy for remission being managed by allergy and immunology coronary artery disease with PCI and stent left circumflex 2020 mild aortic stenosis and regurgitation with a mild enlargement  ascending aorta hypertensive heart disease without heart failure and hyperlipidemia last seen 10/23/2023. 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 Taking Mounjaro A1c 5.5 he has lost weight and feels very well No recurrence of his pleuritic chest pain edema shortness of breath palpitation or syncope  Past Medical History:  Diagnosis Date   Abnormal cardiac CT angiography    Acute idiopathic pericarditis 04/25/2016   Overview:  2017: hosp, EKG changes, colchicine /indomethacin   Acute medial meniscal tear 12/15/2014   Acute non-recurrent sinusitis 08/21/2023   08/21/2023     Aftercare following joint replacement surgery 09/23/2017   Allergy to meat 01/01/2018   Formatting of this note might be different from the original.  2018     Angina pectoris 05/05/2018   Aortic regurgitation 07/07/2018   Aortic stenosis, mild 07/07/2018   Arthritis    LEFT SHOULDER   Asthma    pt unsure   Benign hypertension 11/27/2015   Bicuspid aortic valve 05/22/2018   CAD in native artery 11/11/2018   Complication of anesthesia     during a knee surgery , my blood pressure was up & stayed up    Coronary artery disease    Coronary artery disease of native artery of native heart with stable angina pectoris 11/09/2018   COVID-19 virus infection 01/16/2021   Formatting of this note might be different from the original.  01/16/2021     Crushing injury of right thumb 06/02/2023  Enlarged thoracic aorta 05/22/2018   External hemorrhoid, thrombosed 12/28/2015   Overview:  2017:    Fatty liver    Folliculitis 09/03/2021   Formatting of this note might be different from the original.  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 2019: 107/6.4 2020: 5.9 2020: 130/7.7   Wears glasses     Current Medications: Current Meds  Medication Sig   acetaminophen  (TYLENOL ) 500 MG tablet Take 500 mg by mouth daily as needed for mild pain or moderate pain (before golfing.).   ARCALYST  220 MG injection RECONSTITUTE EACH VIAL WITH 2.3 ML STERILE WATER  AND INJECT 2 ML UNDER THE SKIN EVERY WEEK.   b complex vitamins tablet Take 1 tablet by mouth daily.   cholecalciferol (VITAMIN D3) 25 MCG (1000 UT) tablet Take 1,000 Units by mouth daily.   clopidogrel  (PLAVIX ) 75 MG tablet TAKE 1 TABLET(75 MG) BY MOUTH DAILY   Colchicine  0.6 MG CAPS TAKE 1 CAPSULE(0.6 MG) BY MOUTH TWICE DAILY   EPINEPHrine  0.3 mg/0.3 mL IJ SOAJ injection Inject 0.3 mg into the muscle as needed for anaphylaxis. Use as directed for life-threatening allergic reaction.   glucose monitoring kit (FREESTYLE) monitoring kit 1 each by Does not apply route as needed for other.   JARDIANCE 25 MG TABS tablet Take 25 mg by mouth daily.   MOUNJARO 7.5 MG/0.5ML Pen Inject 0.75 mg into the skin once a week.   nitroGLYCERIN  (NITROSTAT ) 0.4 MG SL tablet Place 1 tablet (0.4 mg total) under the tongue every 5 (five) minutes as needed for chest pain.   rosuvastatin  (CRESTOR ) 20 MG tablet Take 1 tablet (20 mg total) by mouth daily.   telmisartan  (MICARDIS ) 20 MG tablet TAKE 1 TABLET BY MOUTH DAILY   Water  For Injection Sterile (STERILE WATER , PRESERVATIVE FREE,) injection  Inject 10 mLs as directed as directed.      EKGs/Labs/Other Studies Reviewed:    The following studies were reviewed today:  Cardiac Studies & Procedures   ______________________________________________________________________________________________ CARDIAC CATHETERIZATION  CARDIAC CATHETERIZATION 11/11/2018  Conclusion  Prox RCA lesion is 20% stenosed.  Post Atrio lesion is 20% stenosed.  Ost Cx lesion is 35% stenosed.  Prox Cx to Mid Cx lesion is 85% stenosed.  Ost 2nd Mrg to 2nd Mrg lesion is 80% stenosed.  1st Diag lesion is 50% stenosed.  Ost LAD to Prox LAD lesion is 20% stenosed.  Prox LAD lesion is 55% stenosed.  Prox LAD to Mid LAD lesion is 30% stenosed.  Dist LAD lesion is 80% stenosed.  Post intervention, there is a 0% residual stenosis.  A stent was successfully placed.  Evidence for multivessel coronary calcification with most prominent calcification in the proximal to mid LAD.  The LAD has  smooth 20% proximal narrowing.  The first diagonal vessel has a superior branch that has mid narrowing of 50%.  Beyond the first diagonal vessel the LAD has calcification proximally and inferiorly and probable 50 to 60% narrowing followed by 30% irregularity to the mid segment.  The apical portion of the LAD has 80% stenosis.  The left circumflex has 30-40% focal ostial stenosis and has an eccentric 85% stenosis after a small left atrial circumflex branch and before a very small second marginal vessel.  The second marginal vessel has ostial proximal narrowing of 80%.  The RCA is a dominant vessel with mild calcification with 20% proximal narrowing and 20% smooth narrowing in the proximal PLA.  Successful PCI with a Wolverine cutting balloon predilatation and ultimate DES stenting with a 2.5 x 18 mm Resolute Onyx stent postdilated to approximately 2.5 mm with the 85% stenosis being reduced to 0% and brisk TIMI-3 flow.  RECOMMENDATION: The patient has been on  telmisartan  HCT for hypertension and was just given a prescription to start rosuvastatin  10 mg.  Since he is never had any edema, consider discontinuing telmisartan  HCT and consider possible therapy with amlodipine  and beta-blocker therapy for anti-ischemic benefit and possible low dose ARB if additional BP control is needed.  Recommend dual antiplatelet therapy and with significant concomitant CAD for at least 1 year.  Will increase rosuvastatin  to 40 mg for optimal LDL management and if unable to reach a target less than 70 consider adding Zetia 10 mg or possible PCSK9 inhibition.  If patient develops recurrent symptomatology on anti-ischemic medication, consider intervention to the mid LAD with possible atherectomy due to superior and inferior calcification.  Findings Coronary Findings Diagnostic  Dominance: Right  Left Anterior Descending Ost LAD to Prox LAD lesion is 20% stenosed. Prox LAD lesion is 55% stenosed. Prox LAD to Mid LAD lesion is 30% stenosed. Dist LAD lesion is 80% stenosed.  First Diagonal Branch 1st Diag lesion is 50% stenosed.  Left Circumflex Ost Cx lesion is 35% stenosed. Prox Cx to Mid Cx lesion is 85% stenosed.  First Obtuse Marginal Branch Vessel is small in size.  Second Obtuse Marginal Branch Vessel is small in size. Ost 2nd Mrg to 2nd Mrg lesion is 80% stenosed.  Right Coronary Artery Prox RCA lesion is 20% stenosed.  Right Posterior Atrioventricular Artery Post Atrio lesion is 20% stenosed.  Intervention  Prox Cx to Mid Cx lesion Stent A stent was successfully placed. Post-Intervention Lesion Assessment The intervention was successful. Pre-interventional TIMI flow is 3. Post-intervention TIMI flow is 3. No complications occurred at this lesion. There is a 0% residual stenosis post intervention.     ECHOCARDIOGRAM  ECHOCARDIOGRAM COMPLETE 11/14/2023  Narrative ECHOCARDIOGRAM REPORT    Patient Name:   Eugene Taylor Date of Exam:  11/14/2023 Medical Rec #:  [id number]       Height:       70.0 in Accession #:    [id number]      Weight:       200.4 lb Date of Birth:  1951/08/17        BSA:          2.089 m Patient Age:    72 years        BP:           120/76 mmHg Patient Gender: M               HR:           70  bpm. Exam Location:  [place]  Procedure: 2D Echo, Cardiac Doppler, Color Doppler and Strain Analysis (Both Spectral and Color Flow Doppler were utilized during procedure).  Indications:    Recurrent idiopathic pericarditis [I30.0 (ICD-10-CM)]; Coronary artery disease of native artery of native heart with stable angina pectoris (HCC) [I25.118 (ICD-10-CM)]; Nonrheumatic aortic valve insufficiency [I35.1 (ICD-10-CM)]; Aortic stenosis, mild [I35.0 (ICD-10-CM)]; Hypertensive heart disease without heart failure [I11.9 (ICD-10-CM)]; Enlarged thoracic aorta (HCC) [I77.89 (ICD-10-CM)]; Mixed hyperlipidemia [E78.2 (ICD-10-CM)]; Type 2 diabetes mellitus without complication, without long-term current use of insulin (HCC) [E11.9 (ICD-10-CM)]  History:        Patient has prior history of Echocardiogram examinations, most recent 07/04/2022. CAD; Risk Factors:Hypertension and Dyslipidemia. Recurrent idiopathic pericarditis. Aortic stenosis, mild.  Sonographer:    Charlie Jointer RDCS Referring Phys: [id number] Deavion Dobbs J Bridgit Eynon  IMPRESSIONS   1. Left ventricular ejection fraction, by estimation, is 60 to 65%. The left ventricle has normal function. The left ventricle has no regional wall motion abnormalities. There is mild left ventricular hypertrophy. Left ventricular diastolic parameters were normal. The average left ventricular global longitudinal strain is -14.0 %. The global longitudinal strain is abnormal. 2. Right ventricular systolic function is normal. The right ventricular size is normal. 3. The mitral valve is grossly normal. Trivial mitral valve regurgitation. No evidence of mitral stenosis. 4. The aortic valve is  tricuspid. There is mild calcification of the aortic valve. There is moderate thickening of the aortic valve. Trace aortic valve regurgitation. 5. Mild to moderate aortic valve stenosis. Aortic valve area, by VTI measures 1.37 cm. Aortic valve mean gradient measures 18.0 mmHg. Aortic valve Vmax measures 2.86 m/s. 6. Tricuspid regurgitation signal is inadequate for assessing PA pressure.  Comparison(s): In comparison prior echocardiogram report from 07/04/2022 noted LVEF 60-65%, Mild AS, mild AI.  FINDINGS Left Ventricle: Left ventricular ejection fraction, by estimation, is 60 to 65%. The left ventricle has normal function. The left ventricle has no regional wall motion abnormalities. The average left ventricular global longitudinal strain is -14.0 %. Strain was performed and the global longitudinal strain is abnormal. The left ventricular internal cavity size was normal in size. There is mild left ventricular hypertrophy. Left ventricular diastolic parameters were normal.  Right Ventricle: The right ventricular size is normal. Right vetricular wall thickness was not well visualized. Right ventricular systolic function is normal. Tricuspid regurgitation signal is inadequate for assessing PA pressure.  Left Atrium: Left atrial size was normal in size.  Right Atrium: Right atrial size was normal in size.  Pericardium: There is no evidence of pericardial effusion.  Mitral Valve: The mitral valve is grossly normal. Trivial mitral valve regurgitation. No evidence of mitral valve stenosis.  Tricuspid Valve: The tricuspid valve is not well visualized. Tricuspid valve regurgitation is mild . No evidence of tricuspid stenosis.  Aortic Valve: The aortic valve is tricuspid. There is mild calcification of the aortic valve. There is moderate thickening of the aortic valve. Aortic valve regurgitation is trivial. Mild to moderate aortic stenosis is present. Aortic valve mean gradient measures 18.0 mmHg.  Aortic valve peak gradient measures 32.8 mmHg. Aortic valve area, by VTI measures 1.37 cm.  Pulmonic Valve: The pulmonic valve was not well visualized. Pulmonic valve regurgitation is not visualized. No evidence of pulmonic stenosis.  Aorta: The aortic root and ascending aorta are structurally normal, with no evidence of dilitation.  Venous: The inferior vena cava was not well visualized.  IAS/Shunts: The interatrial septum was not well visualized.   LEFT VENTRICLE PLAX  2D LVIDd:         4.80 cm   Diastology LVIDs:         3.50 cm   LV e' medial:    7.62 cm/s LV PW:         1.10 cm   LV E/e' medial:  9.4 LV IVS:        1.30 cm   LV e' lateral:   10.80 cm/s LVOT diam:     2.20 cm   LV E/e' lateral: 6.6 LV SV:         75 LV SV Index:   36        2D Longitudinal Strain LVOT Area:     3.80 cm  2D Strain GLS Avg:     -14.0 %   RIGHT VENTRICLE RV Basal diam:  3.30 cm RV Mid diam:    2.90 cm RV S prime:     10.90 cm/s TAPSE (M-mode): 1.6 cm  LEFT ATRIUM             Index        RIGHT ATRIUM           Index LA diam:        3.50 cm 1.68 cm/m   RA Area:     15.50 cm LA Vol (A2C):   55.9 ml 26.76 ml/m  RA Volume:   35.30 ml  16.90 ml/m LA Vol (A4C):   42.2 ml 20.20 ml/m LA Biplane Vol: 52.6 ml 25.18 ml/m AORTIC VALVE AV Area (Vmax):    1.20 cm AV Area (Vmean):   1.18 cm AV Area (VTI):     1.37 cm AV Vmax:           286.50 cm/s AV Vmean:          189.200 cm/s AV VTI:            0.551 m AV Peak Grad:      32.8 mmHg AV Mean Grad:      18.0 mmHg LVOT Vmax:         90.30 cm/s LVOT Vmean:        58.700 cm/s LVOT VTI:          0.198 m LVOT/AV VTI ratio: 0.36  AORTA Ao Root diam: 3.00 cm Ao Asc diam:  3.30 cm  MITRAL VALVE MV Area (PHT): 3.61 cm    SHUNTS MV Decel Time: 210 msec    Systemic VTI:  0.20 m MV E velocity: 71.80 cm/s  Systemic Diam: 2.20 cm MV A velocity: 62.90 cm/s MV E/A ratio:  1.14  Sreedhar reddy Madireddy Electronically signed by Alean reddy  Madireddy Signature Date/Time: 11/14/2023/12:03:42 PM    Final      CT SCANS  CT CORONARY MORPH W/CTA COR W/SCORE 11/06/2018  Addendum 11/08/2018  9:57 AM ADDENDUM REPORT: 11/08/2018 09:55  CLINICAL DATA:  Chest pain  EXAM: CT FFR  MEDICATIONS: No additional medications  TECHNIQUE: The coronary CT was sent for FFR.  FINDINGS: FFR 0.62 distal LCx  FFR 0.67 mid LAD  FFR 0.66 mid D1  FFR 0.84 proximal PDA  IMPRESSION: There is evidence for hemodynamically significant stenosis in the proximal LAD, ostial/proximal D1, and mid LCx.  Dalton Mclean   Electronically Signed By: Ezra Shuck M.D. On: 11/08/2018 09:55  Addendum 11/06/2018  5:48 PM ADDENDUM REPORT: 11/06/2018 17:45  CLINICAL DATA:  Chest pain  EXAM: Cardiac CTA  MEDICATIONS: Sub lingual nitro. 4mg  x 2 and lopressor  mg  TECHNIQUE: The patient was scanned on a Siemens 192 slice scanner. Gantry rotation speed was 250 msecs. Collimation was 0.6 mm. A 100 kV prospective scan was triggered in the ascending thoracic aorta at 35-75% of the R-R interval. Average HR during the scan was 60 bpm. The 3D data set was interpreted on a dedicated work station using MPR, MIP and VRT modes. A total of 80cc of contrast was used.  FINDINGS: Non-cardiac: See separate report from [facility].  The pulmonary veins drain normally to the left atrium.  Calcium  Score: 782 Agatston units.  Coronary Arteries: Right dominant with no anomalies  LM: No plaque or stenosis.  LAD system: The LAD gives rise to a large 1st diagonal. The proximal LAD and D1 are heavily involved by mixed plaque. Concern for severe (70-90%) stenosis in proximal LAD just beyond D1 ostium and in proximal D1.  Circumflex system: Extensive mixed plaque proximal and mid LCx. Possible severe (70-90%) stenosis.  RCA system: Mixed plaque in proximal, mid, and distal RCA. Suspect mild (<50%) stenosis in the proximal and mid RCA. Ostial  PDA with mixed plaque, possible severe (70-90%) stenosis.  IMPRESSION: 1. Coronary artery calcium  score 782 Agatston units, placing the patient in the 89th percentile for age and gender, suggesting high risk for future cardiac events.  2. Concern for significant stenosis in the ostial PDA, proximal LAD, proximal D1, and proximal LCx. Will send for FFR.  Dalton Mclean   Electronically Signed By: Ezra Shuck M.D. On: 11/06/2018 17:45  Narrative EXAM: OVER-READ INTERPRETATION  CT CHEST  The following report is an over-read performed by radiologist Dr. Toribio Aye of [facility] on 11/06/2018. This over-read does not include interpretation of cardiac or coronary anatomy or pathology. The coronary calcium  score/coronary CTA interpretation by the cardiologist is attached.  COMPARISON:  None.  FINDINGS: Aortic atherosclerosis. Within the visualized portions of the thorax there are no suspicious appearing pulmonary nodules or masses, there is no acute consolidative airspace disease, no pleural effusions, no pneumothorax and no lymphadenopathy. Visualized portions of the upper abdomen are unremarkable. There are no aggressive appearing lytic or blastic lesions noted in the visualized portions of the skeleton.  IMPRESSION: 1.  Aortic Atherosclerosis (ICD10-I70.0).  Electronically Signed: By: Toribio Aye M.D. On: 11/06/2018 09:24     ______________________________________________________________________________________________      EKG Interpretation Date/Time:  Thursday May 20 2024 09:40:04 EDT Ventricular Rate:  71 PR Interval:  152 QRS Duration:  82 QT Interval:  384 QTC Calculation: 417 R Axis:   0  Text Interpretation: Normal sinus rhythm Minimal voltage criteria for LVH, may be normal variant ( R in aVL ) Nonspecific T wave abnormality When compared with ECG of 23-Apr-2023 14:43, No significant change was found Confirmed by Monetta Rogue ([id number]) on 05/20/2024 9:59:35 AM   Recent Labs: 10/09/2023: Hemoglobin 17.2; Platelets 197  Recent Lipid Panel    Component Value Date/Time   CHOL 166 06/20/2022 0855   TRIG 206 (H) 06/20/2022 0855   HDL 46 06/20/2022 0855   CHOLHDL 3.6 06/20/2022 0855   LDLCALC 85 06/20/2022 0855    Physical Exam:    VS:  BP (!) 144/82   Pulse 71   Ht 5' 11 (1.803 m)   Wt 188 lb 9.6 oz (85.5 kg)   SpO2 99%   BMI 26.30 kg/m     Wt Readings from Last 3 Encounters:  05/20/24 188 lb 9.6 oz (85.5 kg)  10/23/23 200 lb 6.4 oz (90.9 kg)  10/09/23 203 lb 9.6 oz (92.4 kg)     GEN:  Well nourished, well developed in no acute distress HEENT: Normal NECK: No JVD; No carotid bruits LYMPHATICS: No lymphadenopathy CARDIAC: No pericardial rub RRR, no murmurs, rubs, gallops RESPIRATORY:  Clear to auscultation without rales, wheezing or rhonchi  ABDOMEN: Soft, non-tender, non-distended MUSCULOSKELETAL:  No edema; No deformity  SKIN: Warm and dry NEUROLOGIC:  Alert and oriented x 3 PSYCHIATRIC:  Normal affect    Signed, Redell Leiter, MD  05/20/2024 10:31 AM    [facility]

## 2024-05-19 ENCOUNTER — Other Ambulatory Visit: Payer: Self-pay

## 2024-05-19 DIAGNOSIS — M2550 Pain in unspecified joint: Secondary | ICD-10-CM | POA: Insufficient documentation

## 2024-05-20 ENCOUNTER — Encounter: Payer: Self-pay | Admitting: Cardiology

## 2024-05-20 ENCOUNTER — Ambulatory Visit: Attending: Cardiology | Admitting: Cardiology

## 2024-05-20 VITALS — BP 144/82 | HR 71 | Ht 71.0 in | Wt 188.6 lb

## 2024-05-20 DIAGNOSIS — I300 Acute nonspecific idiopathic pericarditis: Secondary | ICD-10-CM

## 2024-05-20 DIAGNOSIS — I351 Nonrheumatic aortic (valve) insufficiency: Secondary | ICD-10-CM

## 2024-05-20 DIAGNOSIS — E782 Mixed hyperlipidemia: Secondary | ICD-10-CM

## 2024-05-20 DIAGNOSIS — I25118 Atherosclerotic heart disease of native coronary artery with other forms of angina pectoris: Secondary | ICD-10-CM | POA: Diagnosis not present

## 2024-05-20 DIAGNOSIS — I119 Hypertensive heart disease without heart failure: Secondary | ICD-10-CM

## 2024-05-20 DIAGNOSIS — E119 Type 2 diabetes mellitus without complications: Secondary | ICD-10-CM

## 2024-05-20 NOTE — Patient Instructions (Signed)
 Medication Instructions:  Your physician recommends that you continue on your current medications as directed. Please refer to the Current Medication list given to you today.  *If you need a refill on your cardiac medications before your next appointment, please call your pharmacy*  Lab Work: Your physician recommends that you return for lab work in:   Labs today: CBC, CRP, Sed Rate  If you have labs (blood work) drawn today and your tests are completely normal, you will receive your results only by: MyChart Message (if you have MyChart) OR A paper copy in the mail If you have any lab test that is abnormal or we need to change your treatment, we will call you to review the results.  Testing/Procedures: None  Follow-Up: At [facility], you and your health needs are our priority.  As part of our continuing mission to provide you with exceptional heart care, our providers are all part of one team.  This team includes your primary Cardiologist (physician) and Advanced Practice Providers or APPs (Physician Assistants and Nurse Practitioners) who all work together to provide you with the care you need, when you need it.  Your next appointment:   6 month(s)  Provider:   Redell Leiter, MD    We recommend signing up for the patient portal called MyChart.  Sign up information is provided on this After Visit Summary.  MyChart is used to connect with patients for Virtual Visits (Telemedicine).  Patients are able to view lab/test results, encounter notes, upcoming appointments, etc.  Non-urgent messages can be sent to your provider as well.   To learn more about what you can do with MyChart, go to [email or web address].   Other Instructions None

## 2024-05-21 ENCOUNTER — Ambulatory Visit: Payer: Self-pay | Admitting: Cardiology

## 2024-05-21 LAB — SEDIMENTATION RATE: Sed Rate: 17 mm/h (ref 0–30)

## 2024-05-21 LAB — C-REACTIVE PROTEIN: CRP: <1 mg/L (ref 0–10)

## 2024-05-21 LAB — CBC
Hematocrit: 55.1 % — ABNORMAL HIGH (ref 37.5–51.0)
Hemoglobin: 18.2 g/dL — ABNORMAL HIGH (ref 13.0–17.7)
MCH: 31.5 pg (ref 26.6–33.0)
MCHC: 33 g/dL (ref 31.5–35.7)
MCV: 95 fL (ref 79–97)
Platelets: 203 x10E3/uL (ref 150–450)
RBC: 5.78 x10E6/uL (ref 4.14–5.80)
RDW: 13.7 % (ref 11.6–15.4)
WBC: 4.7 x10E3/uL (ref 3.4–10.8)

## 2024-05-25 ENCOUNTER — Other Ambulatory Visit: Payer: Self-pay

## 2024-05-25 DIAGNOSIS — R718 Other abnormality of red blood cells: Secondary | ICD-10-CM

## 2024-06-07 ENCOUNTER — Encounter: Admitting: Oncology

## 2024-06-07 ENCOUNTER — Other Ambulatory Visit

## 2024-06-09 ENCOUNTER — Other Ambulatory Visit

## 2024-06-09 ENCOUNTER — Encounter: Admitting: Oncology

## 2024-06-10 ENCOUNTER — Other Ambulatory Visit: Payer: Self-pay | Admitting: Oncology

## 2024-06-10 DIAGNOSIS — D751 Secondary polycythemia: Secondary | ICD-10-CM

## 2024-06-10 NOTE — Progress Notes (Signed)
 [facility] at [facility] [address] [place],  [place]  [address] [phone]  Clinic Day: 06/11/2024  Referring physician: Monetta Redell PARAS, MD   HISTORY OF PRESENT ILLNESS:  The patient is a 73 y.o. male  who I was asked to consult upon for elevated red cell indices.  Recent labs showed an elevated hemoglobin and hematocrit of 18.2 and 55.1, respectively.  According to the patient, he has known about having an elevated hemoglobin level for at least the past 2 years.  He denies taking any type of testosterone or anabolic steroid supplement.  He denies ever living in high altitude areas.  He also denies having a known history of chronic hypoxia.  Of note, this patient has been taking Jardiance for a few years for his diabetic management.  He is also on Mounjaro.  Of note, he is also on Arcalyst  for recurrent pericarditis.  The only family history of any hematologic disorders he is aware of is with his brother, who has B12 deficiency.  PAST MEDICAL HISTORY:   Past Medical History:  Diagnosis Date   Abnormal cardiac CT angiography    Acute idiopathic pericarditis 04/25/2016   Overview:  2017: hosp, EKG changes, colchicine /indomethacin   Acute medial meniscal tear 12/15/2014   Acute non-recurrent sinusitis 08/21/2023   08/21/2023     Aftercare following joint replacement surgery 09/23/2017   Allergy to meat 01/01/2018   Formatting of this note might be different from the original.  2018     Angina pectoris 05/05/2018   Aortic regurgitation 07/07/2018   Aortic stenosis, mild 07/07/2018   Arthritis    LEFT SHOULDER   Asthma    pt unsure   Benign hypertension 11/27/2015   Bicuspid aortic valve 05/22/2018   CAD in native artery 11/11/2018   Complication of anesthesia     during a knee surgery , my blood pressure was up & stayed up    Coronary artery disease    Coronary artery disease of native artery of native heart with stable angina pectoris 11/09/2018    COVID-19 virus infection 01/16/2021   Formatting of this note might be different from the original.  01/16/2021     Crushing injury of right thumb 06/02/2023   Enlarged thoracic aorta 05/22/2018   External hemorrhoid, thrombosed 12/28/2015   Overview:  2017:    Fatty liver    Folliculitis 09/03/2021   Formatting of this note might be different from the original.  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 2019: 107/6.4 2020: 5.9 2020: 130/7.7   Wears glasses     PAST SURGICAL HISTORY:   Past Surgical History:  Procedure Laterality Date   APPENDECTOMY  age 27   CARDIAC  CATHETERIZATION  11/11/2018   CHONDROPLASTY Right 12/15/2014   Procedure: CHONDROPLASTY;  Surgeon: Dempsey Moan, MD;  Location: [facility];  Service: Orthopedics;  Laterality: Right;   COLONOSCOPY W/ POLYPECTOMY  2011   CORONARY STENT INTERVENTION  11/11/2018   CORONARY STENT INTERVENTION N/A 11/11/2018   Procedure: CORONARY STENT INTERVENTION;  Surgeon: Burnard Debby LABOR, MD;  Location: [facility];  Service: Cardiovascular;  Laterality: N/A;   CYSTO/  URETEROSCOPIC STONE EXTRACTIONS  1995   KNEE ARTHROSCOPY WITH LATERAL MENISECTOMY Right 12/15/2014   Procedure: KNEE ARTHROSCOPY WITH LATERAL MENISECTOMY;   Surgeon: Dempsey Moan, MD;  Location: [facility];  Service: Orthopedics;  Laterality: Right;   KNEE ARTHROSCOPY WITH MEDIAL MENISECTOMY Right 12/15/2014   Procedure: KNEE ARTHROSCOPY WITH MEDIAL MENISECTOMY;  Surgeon: Dempsey Moan, MD;  Location: [facility];  Service: Orthopedics;  Laterality: Right;   LEFT HEART CATH AND CORONARY ANGIOGRAPHY N/A 11/11/2018   Procedure: LEFT HEART CATH AND CORONARY ANGIOGRAPHY;  Surgeon: Burnard Debby LABOR, MD;  Location: [facility];  Service: Cardiovascular;  Laterality: N/A;   NASAL SEPTUM SURGERY  1995   REVERSE SHOULDER ARTHROPLASTY Left 11/24/2020   Procedure: REVERSE SHOULDER ARTHROPLASTY;  Surgeon: Kay Kemps, MD;  Location: [facility];  Service: Orthopedics;  Laterality: Left;  interscalene block   TOTAL KNEE ARTHROPLASTY Right     CURRENT MEDICATIONS:   Current Outpatient Medications  Medication Sig Dispense Refill   acetaminophen  (TYLENOL ) 500 MG tablet Take 500 mg by mouth daily as needed for mild pain or moderate pain (before golfing.).     ARCALYST  220 MG injection RECONSTITUTE EACH VIAL WITH 2.3 ML STERILE WATER  AND INJECT 2 ML UNDER THE SKIN EVERY WEEK. 4 each 11   b complex vitamins tablet Take 1 tablet by mouth daily.     cholecalciferol (VITAMIN D3) 25 MCG (1000 UT) tablet Take 1,000 Units by mouth daily.     clopidogrel  (PLAVIX ) 75 MG tablet TAKE 1 TABLET(75 MG) BY MOUTH DAILY 90 tablet 2   Colchicine  0.6 MG CAPS TAKE 1 CAPSULE(0.6 MG) BY MOUTH TWICE DAILY 180 capsule 2   EPINEPHrine  0.3 mg/0.3 mL IJ SOAJ injection Inject 0.3 mg into the muscle as needed for anaphylaxis. Use as directed for life-threatening allergic reaction. 2 each 2   glucose monitoring kit (FREESTYLE) monitoring kit 1 each by Does not apply route as needed for other. 1 each 0   JARDIANCE 25 MG TABS tablet Take 25 mg by mouth daily.     MOUNJARO 7.5 MG/0.5ML Pen Inject 0.75 mg into the skin once a week.     nitroGLYCERIN  (NITROSTAT ) 0.4  MG SL tablet Place 1 tablet (0.4 mg total) under the tongue every 5 (five) minutes as needed for chest pain. 25 tablet 6   rosuvastatin  (CRESTOR ) 20 MG tablet Take 1 tablet (20 mg total) by mouth daily. 90 tablet 2   telmisartan  (MICARDIS ) 20 MG tablet TAKE 1 TABLET BY MOUTH DAILY 90 tablet 2   Water  For Injection Sterile (STERILE WATER , PRESERVATIVE FREE,) injection Inject 10 mLs as directed as directed. 40 mL 11   No current facility-administered medications for this visit.    ALLERGIES:   Allergies  Allergen Reactions   Other Anaphylaxis    Red Meat   Penicillins Other (See Comments)    Did it involve swelling of the face/tongue/throat, SOB, or low BP? Unknown Did it involve sudden or severe rash/hives, skin peeling, or any reaction on the inside of  your mouth or nose? Unknown Did you need to seek medical attention at a hospital or doctor's office? Unknown When did it last happen?      Childhood allergy If all above answers are "NO", may proceed with cephalosporin use.    Hydrocodone -Acetaminophen      Other Reaction(s): Unknown   Penicillamine     Other Reaction(s): Not available, Not available   Hydrocodeine [Dihydrocodeine] Itching   Sulfamethoxazole-Trimethoprim Rash    FAMILY HISTORY:   Family History  Problem Relation Age of Onset   Stomach cancer Mother    Pancreatic cancer Father    Liver cancer Father    Kidney cancer Sister    Kidney cancer Brother    Diabetes Brother    Other Half-Sister        DIED AS RESULT OF MVA    SOCIAL HISTORY:  The patient was born and raised in [place].  He currently lives in the [place] community with his wife of 50 years.  They have 3 children and 5 grandchildren.  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  He also is a Education officer, environmental.  There is no history of alcoholism or tobacco abuse.  REVIEW OF SYSTEMS:  Review of Systems  Constitutional:  Negative for fatigue, fever and unexpected weight change.   Respiratory:  Negative for chest tightness, cough, hemoptysis and shortness of breath.   Cardiovascular:  Negative for chest pain and palpitations.  Gastrointestinal:  Negative for abdominal distention, abdominal pain, blood in stool, constipation, diarrhea, nausea and vomiting.  Genitourinary:  Negative for dysuria, frequency and hematuria.   Musculoskeletal:  Negative for arthralgias, back pain and myalgias.  Skin:  Negative for itching and rash.  Neurological:  Negative for dizziness, headaches and light-headedness.  Psychiatric/Behavioral:  Negative for depression and suicidal ideas. The patient is not nervous/anxious.      PHYSICAL EXAM:  Blood pressure (!) 149/93, pulse 77, temperature 98.2 F (36.8 C), temperature source Oral, resp. rate 16, height 5' 10.5 (1.791 m), weight 191 lb 8 oz (86.9 kg), SpO2 97%. Wt Readings from Last 3 Encounters:  06/11/24 191 lb 8 oz (86.9 kg)  05/20/24 188 lb 9.6 oz (85.5 kg)  10/23/23 200 lb 6.4 oz (90.9 kg)   Body mass index is 27.09 kg/m. Performance status (ECOG): 0 - Asymptomatic Physical Exam Constitutional:      Appearance: Normal appearance. He is not ill-appearing.     Comments: Minimal facial plethora  HENT:     Mouth/Throat:     Mouth: Mucous membranes are moist.     Pharynx: Oropharynx is clear. No oropharyngeal exudate or posterior oropharyngeal erythema.  Cardiovascular:     Rate and Rhythm: Normal rate and regular rhythm.     Heart sounds: No murmur heard.    No friction rub. No gallop.  Pulmonary:     Effort: Pulmonary effort is normal. No respiratory distress.     Breath sounds: Normal breath sounds. No wheezing, rhonchi or rales.  Abdominal:     General: Bowel sounds are normal. There is no distension.     Palpations: Abdomen is soft. There is no mass.     Tenderness: There is no abdominal tenderness.  Musculoskeletal:        General: No swelling.     Right lower leg: No edema.     Left lower leg: No edema.   Lymphadenopathy:     Cervical: No cervical adenopathy.     Upper Body:  Right upper body: No supraclavicular or axillary adenopathy.     Left upper body: No supraclavicular or axillary adenopathy.     Lower Body: No right inguinal adenopathy. No left inguinal adenopathy.  Skin:    General: Skin is warm.     Coloration: Skin is not jaundiced.     Findings: No lesion or rash.  Neurological:     General: No focal deficit present.     Mental Status: He is alert and oriented to person, place, and time. Mental status is at baseline.  Psychiatric:        Mood and Affect: Mood normal.        Behavior: Behavior normal.        Thought Content: Thought content normal.     LABS:      Latest Ref Rng & Units 06/11/2024   12:50 PM 05/20/2024   10:25 AM 10/09/2023    4:10 PM  CBC  WBC 4.0 - 10.5 K/uL 5.6  4.7  5.2   Hemoglobin 13.0 - 17.0 g/dL 82.8  81.7  82.7   Hematocrit 39.0 - 52.0 % 51.5  55.1  52.0   Platelets 150 - 400 K/uL 211  203  197       Latest Ref Rng & Units 06/11/2024   12:50 PM 04/23/2023    3:25 PM 07/10/2022    9:01 AM  CMP  Glucose 70 - 99 mg/dL 93  877  86   BUN 8 - 23 mg/dL 21  20  20    Creatinine 0.61 - 1.24 mg/dL 8.79  8.85  8.89   Sodium 135 - 145 mmol/L 138  140  140   Potassium 3.5 - 5.1 mmol/L 4.0  4.1  4.4   Chloride 98 - 111 mmol/L 103  103  105   CO2 22 - 32 mmol/L 24  19  20    Calcium  8.9 - 10.3 mg/dL 89.5  9.4  9.5   Total Protein 6.5 - 8.1 g/dL 7.2  6.6  6.7   Total Bilirubin 0.0 - 1.2 mg/dL 0.8  0.5  0.6   Alkaline Phos 38 - 126 U/L 67  86  76   AST 15 - 41 U/L 27  28  23    ALT 0 - 44 U/L 22  25  21      Latest Reference Range & Units 06/11/24 12:50  Iron 45 - 182 ug/dL 86  UIBC ug/dL 683  TIBC 749 - 549 ug/dL 597  Saturation Ratios 17.9 - 39.5 % 21  Ferritin 24 - 336 ng/mL 103   ASSESSMENT & PLAN:  A 73 y.o. male who I was asked to consult upon for elevated red cell indices.  When evaluating this gentleman's labs in the hospital system,  which dates back to at least 2016, his hemoglobin has chronically been in the 16-17 range over this time.  Due to its chronicity, I will order JAK2 mutation testing to ensure underlying polycythemia rubra vera is not present.  Of note, his iron levels were checked today, which were not particularly elevated.  Based upon this, it is highly unlikely hemochromatosis is factoring into his elevated red cell indices.  When looking at his medication list, this gentleman is on Jardiance, which is an SGLT2 inhibitor.  This class of drugs is known to cause elevated red cell indices.  Overall, when evaluating his hematologic picture, this gentleman's hemoglobin is only minimally elevated.  I would not recommend any medication changes based upon this.  Furthermore, no specific interventions (ie. Phlebotomy) need to be given at this time as his red cell indices are not particularly elevated.  I will see him back in 2 months to go over his JAK2 mutation test results, as well as to reevaluate his red cell indices at that time.  The patient understands all the plans discussed today and is in agreement with them.  I do appreciate Monetta Redell PARAS, MD for his new consult.   Dianne Whelchel DELENA Kerns, MD

## 2024-06-11 ENCOUNTER — Inpatient Hospital Stay: Admitting: Oncology

## 2024-06-11 ENCOUNTER — Telehealth: Payer: Self-pay | Admitting: Oncology

## 2024-06-11 ENCOUNTER — Encounter: Payer: Self-pay | Admitting: Oncology

## 2024-06-11 ENCOUNTER — Other Ambulatory Visit: Payer: Self-pay | Admitting: Oncology

## 2024-06-11 ENCOUNTER — Inpatient Hospital Stay: Attending: Oncology

## 2024-06-11 VITALS — BP 149/93 | HR 77 | Temp 98.2°F | Resp 16 | Ht 70.5 in | Wt 191.5 lb

## 2024-06-11 DIAGNOSIS — Z7984 Long term (current) use of oral hypoglycemic drugs: Secondary | ICD-10-CM | POA: Insufficient documentation

## 2024-06-11 DIAGNOSIS — Z800 Family history of malignant neoplasm of digestive organs: Secondary | ICD-10-CM

## 2024-06-11 DIAGNOSIS — Z8051 Family history of malignant neoplasm of kidney: Secondary | ICD-10-CM | POA: Diagnosis not present

## 2024-06-11 DIAGNOSIS — I319 Disease of pericardium, unspecified: Secondary | ICD-10-CM | POA: Diagnosis not present

## 2024-06-11 DIAGNOSIS — D751 Secondary polycythemia: Secondary | ICD-10-CM

## 2024-06-11 DIAGNOSIS — Z808 Family history of malignant neoplasm of other organs or systems: Secondary | ICD-10-CM | POA: Diagnosis not present

## 2024-06-11 DIAGNOSIS — E119 Type 2 diabetes mellitus without complications: Secondary | ICD-10-CM | POA: Diagnosis not present

## 2024-06-11 DIAGNOSIS — Z79899 Other long term (current) drug therapy: Secondary | ICD-10-CM | POA: Insufficient documentation

## 2024-06-11 DIAGNOSIS — R718 Other abnormality of red blood cells: Secondary | ICD-10-CM | POA: Insufficient documentation

## 2024-06-11 DIAGNOSIS — Z7985 Long-term (current) use of injectable non-insulin antidiabetic drugs: Secondary | ICD-10-CM

## 2024-06-11 LAB — CMP (CANCER CENTER ONLY)
ALT: 22 U/L (ref 0–44)
AST: 27 U/L (ref 15–41)
Albumin: 4.3 g/dL (ref 3.5–5.0)
Alkaline Phosphatase: 67 U/L (ref 38–126)
Anion gap: 10 (ref 5–15)
BUN: 21 mg/dL (ref 8–23)
CO2: 24 mmol/L (ref 22–32)
Calcium: 10.4 mg/dL — ABNORMAL HIGH (ref 8.9–10.3)
Chloride: 103 mmol/L (ref 98–111)
Creatinine: 1.2 mg/dL (ref 0.61–1.24)
GFR, Estimated: >60 mL/min (ref >60)
Glucose, Bld: 93 mg/dL (ref 70–99)
Potassium: 4 mmol/L (ref 3.5–5.1)
Sodium: 138 mmol/L (ref 135–145)
Total Bilirubin: 0.8 mg/dL (ref 0.0–1.2)
Total Protein: 7.2 g/dL (ref 6.5–8.1)

## 2024-06-11 LAB — CBC WITH DIFFERENTIAL (CANCER CENTER ONLY)
Abs Immature Granulocytes: 0.01 K/uL (ref 0.00–0.07)
Basophils Absolute: 0 K/uL (ref 0.0–0.1)
Basophils Relative: 1 %
Eosinophils Absolute: 0.1 K/uL (ref 0.0–0.5)
Eosinophils Relative: 2 %
HCT: 51.5 % (ref 39.0–52.0)
Hemoglobin: 17.1 g/dL — ABNORMAL HIGH (ref 13.0–17.0)
Immature Granulocytes: 0 %
Lymphocytes Relative: 32 %
Lymphs Abs: 1.8 K/uL (ref 0.7–4.0)
MCH: 30.4 pg (ref 26.0–34.0)
MCHC: 33.2 g/dL (ref 30.0–36.0)
MCV: 91.6 fL (ref 80.0–100.0)
Monocytes Absolute: 0.5 K/uL (ref 0.1–1.0)
Monocytes Relative: 9 %
Neutro Abs: 3.1 K/uL (ref 1.7–7.7)
Neutrophils Relative %: 56 %
Platelet Count: 211 K/uL (ref 150–400)
RBC: 5.62 MIL/uL (ref 4.22–5.81)
RDW: 13.4 % (ref 11.5–15.5)
WBC Count: 5.6 K/uL (ref 4.0–10.5)
nRBC: 0 % (ref 0.0–0.2)

## 2024-06-11 LAB — FERRITIN: Ferritin: 103 ng/mL (ref 24–336)

## 2024-06-11 LAB — IRON AND TIBC
Iron: 86 ug/dL (ref 45–182)
Saturation Ratios: 21 % (ref 17.9–39.5)
TIBC: 402 ug/dL (ref 250–450)
UIBC: 316 ug/dL

## 2024-06-11 NOTE — Telephone Encounter (Signed)
 Patient has been scheduled for follow-up visit per 06/11/24 LOS.  Pt given an appt calendar with date and time.

## 2024-06-13 DIAGNOSIS — R718 Other abnormality of red blood cells: Secondary | ICD-10-CM | POA: Insufficient documentation

## 2024-06-21 LAB — JAK2 V617F RFX CALR/MPL/E12-15

## 2024-06-21 LAB — CALR +MPL + E12-E15  (REFLEX)

## 2024-08-10 NOTE — Progress Notes (Unsigned)
 [facility] [address] [place],  [place]  [address] [phone]  Clinic Day: 08/11/2024  Referring physician: Ofilia Lamar CROME, MD   HISTORY OF PRESENT ILLNESS:  The patient is a 73 y.o. male  who I recently began elevated red cell indices. He comes in today to go over labs to determine if an underlying myeloproliferative disorder is present, as well as to reassess his peripheral counts.  Of note, this patient has been taking Jardiance for a few years for his diabetic management.  He is also on Mounjaro.  He is also on Arcalyst  for recurrent pericarditis.  Since his last visit, the patient has been doing well.  He denies having headaches, vision changes of the hip hyperviscosity symptoms which concern him for rising red cell indices.  PHYSICAL EXAM:  Blood pressure (!) 143/94, pulse 73, temperature 98.1 F (36.7 C), temperature source Oral, resp. rate 16, height 5' 10.5 (1.791 m), weight 191 lb 3.2 oz (86.7 kg), SpO2 99%. Wt Readings from Last 3 Encounters:  08/11/24 191 lb 3.2 oz (86.7 kg)  06/11/24 191 lb 8 oz (86.9 kg)  05/20/24 188 lb 9.6 oz (85.5 kg)   Body mass index is 27.05 kg/m. Performance status (ECOG): 0 - Asymptomatic Physical Exam Constitutional:      Appearance: Normal appearance. He is not ill-appearing.     Comments: Healthy-appearing gentleman in no acute distress  HENT:     Mouth/Throat:     Mouth: Mucous membranes are moist.     Pharynx: Oropharynx is clear. No oropharyngeal exudate or posterior oropharyngeal erythema.  Cardiovascular:     Rate and Rhythm: Normal rate and regular rhythm.     Heart sounds: No murmur heard.    No friction rub. No gallop.  Pulmonary:     Effort: Pulmonary effort is normal. No respiratory distress.     Breath sounds: Normal breath sounds. No wheezing, rhonchi or rales.  Abdominal:     General: Bowel sounds are normal. There is no distension.     Palpations: Abdomen is soft. There is no  mass.     Tenderness: There is no abdominal tenderness.  Musculoskeletal:        General: No swelling.     Right lower leg: No edema.     Left lower leg: No edema.  Lymphadenopathy:     Cervical: No cervical adenopathy.     Upper Body:     Right upper body: No supraclavicular or axillary adenopathy.     Left upper body: No supraclavicular or axillary adenopathy.     Lower Body: No right inguinal adenopathy. No left inguinal adenopathy.  Skin:    General: Skin is warm.     Coloration: Skin is not jaundiced.     Findings: No lesion or rash.  Neurological:     General: No focal deficit present.     Mental Status: He is alert and oriented to person, place, and time. Mental status is at baseline.  Psychiatric:        Mood and Affect: Mood normal.        Behavior: Behavior normal.        Thought Content: Thought content normal.     LABS:      Latest Ref Rng & Units 08/11/2024    9:51 AM 06/11/2024   12:50 PM 05/20/2024   10:25 AM  CBC  WBC 4.0 - 10.5 K/uL 5.4  5.6  4.7   Hemoglobin 13.0 -  17.0 g/dL 82.9  82.8  81.7   Hematocrit 39.0 - 52.0 % 50.7  51.5  55.1   Platelets 150 - 400 K/uL 194  211  203       Latest Ref Rng & Units 06/11/2024   12:50 PM 04/23/2023    3:25 PM 07/10/2022    9:01 AM  CMP  Glucose 70 - 99 mg/dL 93  877  86   BUN 8 - 23 mg/dL 21  20  20    Creatinine 0.61 - 1.24 mg/dL 8.79  8.85  8.89   Sodium 135 - 145 mmol/L 138  140  140   Potassium 3.5 - 5.1 mmol/L 4.0  4.1  4.4   Chloride 98 - 111 mmol/L 103  103  105   CO2 22 - 32 mmol/L 24  19  20    Calcium  8.9 - 10.3 mg/dL 89.5  9.4  9.5   Total Protein 6.5 - 8.1 g/dL 7.2  6.6  6.7   Total Bilirubin 0.0 - 1.2 mg/dL 0.8  0.5  0.6   Alkaline Phos 38 - 126 U/L 67  86  76   AST 15 - 41 U/L 27  28  23    ALT 0 - 44 U/L 22  25  21     JAK2 V617F Result Comment  Comment: (NOTE) NEGATIVE The JAK2 V617F mutation is not detected in the provided specimen of this individual. Results should be interpreted in  conjunction with clinical and other laboratory findings for the most accurate interpretation.   CALR Result Comment  Comment: (NOTE) NEGATIVE No insertions or deletions were detected within the analyzed region of the calreticulin (CALR) gene.   MPL Result Comment  Comment: (NOTE) NEGATIVE No MPL mutation was identified in the provided specimen of this individual.   E12-15 Result Comment  Comment: (NOTE) NEGATIVE    JAK2 mutations were not detected in exons 12, 13, 14 and 15. The G to T nucleotide change encoding the V617F mutation was not detected.   ASSESSMENT & PLAN:  A 73 y.o. male who I recently began seeing for elevated red cell indices.  In clinic today, I went over all of his labs with him, for which he could see there is no evidence of a myeloproliferative disorder being present.  Furthermore, his labs today show that all of his red cell indices have fallen back to normal.  Understandably, the patient was pleased with his lab results.  He understands his Bernadine may have been factoring into his borderline elevated red cell indices.  Nevertheless, I see no need for any of his medications to change.  I do feel comfortable seeing him back in 6 months for repeat clinical assessment.  If labs at his next visit remain normal, I will consider turning his care back over to his other physicians.  The patient understands all the plans discussed today and is in agreement with them.  Ashlin Kreps DELENA Kerns, MD

## 2024-08-11 ENCOUNTER — Other Ambulatory Visit: Payer: Self-pay | Admitting: Oncology

## 2024-08-11 ENCOUNTER — Inpatient Hospital Stay

## 2024-08-11 ENCOUNTER — Telehealth: Payer: Self-pay | Admitting: Oncology

## 2024-08-11 ENCOUNTER — Inpatient Hospital Stay: Attending: Oncology | Admitting: Oncology

## 2024-08-11 VITALS — BP 143/94 | HR 73 | Temp 98.1°F | Resp 16 | Ht 70.5 in | Wt 191.2 lb

## 2024-08-11 DIAGNOSIS — Z7984 Long term (current) use of oral hypoglycemic drugs: Secondary | ICD-10-CM | POA: Insufficient documentation

## 2024-08-11 DIAGNOSIS — D751 Secondary polycythemia: Secondary | ICD-10-CM

## 2024-08-11 DIAGNOSIS — I319 Disease of pericardium, unspecified: Secondary | ICD-10-CM | POA: Diagnosis not present

## 2024-08-11 DIAGNOSIS — R718 Other abnormality of red blood cells: Secondary | ICD-10-CM | POA: Diagnosis not present

## 2024-08-11 DIAGNOSIS — E119 Type 2 diabetes mellitus without complications: Secondary | ICD-10-CM | POA: Insufficient documentation

## 2024-08-11 DIAGNOSIS — Z7985 Long-term (current) use of injectable non-insulin antidiabetic drugs: Secondary | ICD-10-CM | POA: Insufficient documentation

## 2024-08-11 LAB — CBC WITH DIFFERENTIAL (CANCER CENTER ONLY)
Abs Immature Granulocytes: 0.01 K/uL (ref 0.00–0.07)
Basophils Absolute: 0.1 K/uL (ref 0.0–0.1)
Basophils Relative: 1 %
Eosinophils Absolute: 0.2 K/uL (ref 0.0–0.5)
Eosinophils Relative: 3 %
HCT: 50.7 % (ref 39.0–52.0)
Hemoglobin: 17 g/dL (ref 13.0–17.0)
Immature Granulocytes: 0 %
Lymphocytes Relative: 39 %
Lymphs Abs: 2.1 K/uL (ref 0.7–4.0)
MCH: 30.4 pg (ref 26.0–34.0)
MCHC: 33.5 g/dL (ref 30.0–36.0)
MCV: 90.7 fL (ref 80.0–100.0)
Monocytes Absolute: 0.5 K/uL (ref 0.1–1.0)
Monocytes Relative: 10 %
Neutro Abs: 2.6 K/uL (ref 1.7–7.7)
Neutrophils Relative %: 47 %
Platelet Count: 194 K/uL (ref 150–400)
RBC: 5.59 MIL/uL (ref 4.22–5.81)
RDW: 13.4 % (ref 11.5–15.5)
WBC Count: 5.4 K/uL (ref 4.0–10.5)
nRBC: 0 % (ref 0.0–0.2)

## 2024-08-11 NOTE — Telephone Encounter (Signed)
 Patient has been scheduled for follow-up visit per 08/11/2024 LOS.  Pt given an appt calendar with date and time.

## 2024-08-18 ENCOUNTER — Other Ambulatory Visit: Payer: Self-pay | Admitting: Cardiology

## 2024-11-17 ENCOUNTER — Ambulatory Visit: Admitting: Cardiology

## 2025-02-09 ENCOUNTER — Inpatient Hospital Stay

## 2025-02-09 ENCOUNTER — Inpatient Hospital Stay: Admitting: Oncology
